# Patient Record
Sex: Female | Born: 1970 | Race: Black or African American | Hispanic: No | Marital: Single | Smoking: Current every day smoker
Health system: Southern US, Community
[De-identification: ages and names within clinical notes are randomized; demographics above are authoritative.]

## PROBLEM LIST (undated history)

## (undated) DIAGNOSIS — Z923 Personal history of irradiation: Secondary | ICD-10-CM

## (undated) DIAGNOSIS — F329 Major depressive disorder, single episode, unspecified: Secondary | ICD-10-CM

## (undated) DIAGNOSIS — G43909 Migraine, unspecified, not intractable, without status migrainosus: Secondary | ICD-10-CM

## (undated) DIAGNOSIS — F32A Depression, unspecified: Secondary | ICD-10-CM

## (undated) DIAGNOSIS — C801 Malignant (primary) neoplasm, unspecified: Secondary | ICD-10-CM

## (undated) HISTORY — PX: WISDOM TOOTH EXTRACTION: SHX21

## (undated) HISTORY — PX: PILONIDAL CYST EXCISION: SHX744

---

## 1998-10-05 ENCOUNTER — Emergency Department (HOSPITAL_COMMUNITY): Admission: EM | Admit: 1998-10-05 | Discharge: 1998-10-05 | Payer: Self-pay | Admitting: Emergency Medicine

## 1999-01-11 ENCOUNTER — Inpatient Hospital Stay: Admission: AD | Admit: 1999-01-11 | Discharge: 1999-01-11 | Payer: Self-pay

## 1999-05-29 ENCOUNTER — Inpatient Hospital Stay (HOSPITAL_COMMUNITY): Admission: AD | Admit: 1999-05-29 | Discharge: 1999-05-29 | Payer: Self-pay | Admitting: Obstetrics

## 1999-12-26 ENCOUNTER — Emergency Department (HOSPITAL_COMMUNITY): Admission: EM | Admit: 1999-12-26 | Discharge: 1999-12-26 | Payer: Self-pay | Admitting: Emergency Medicine

## 2000-11-23 ENCOUNTER — Encounter: Payer: Self-pay | Admitting: *Deleted

## 2000-11-23 ENCOUNTER — Inpatient Hospital Stay (HOSPITAL_COMMUNITY): Admission: AD | Admit: 2000-11-23 | Discharge: 2000-11-23 | Payer: Self-pay | Admitting: Obstetrics & Gynecology

## 2001-08-08 ENCOUNTER — Encounter (INDEPENDENT_AMBULATORY_CARE_PROVIDER_SITE_OTHER): Payer: Self-pay | Admitting: *Deleted

## 2001-08-08 ENCOUNTER — Ambulatory Visit (HOSPITAL_BASED_OUTPATIENT_CLINIC_OR_DEPARTMENT_OTHER): Admission: RE | Admit: 2001-08-08 | Discharge: 2001-08-08 | Payer: Self-pay | Admitting: *Deleted

## 2002-05-17 ENCOUNTER — Observation Stay (HOSPITAL_COMMUNITY): Admission: EM | Admit: 2002-05-17 | Discharge: 2002-05-18 | Payer: Self-pay

## 2002-10-28 ENCOUNTER — Inpatient Hospital Stay (HOSPITAL_COMMUNITY): Admission: AD | Admit: 2002-10-28 | Discharge: 2002-10-28 | Payer: Self-pay | Admitting: *Deleted

## 2005-04-13 ENCOUNTER — Inpatient Hospital Stay (HOSPITAL_COMMUNITY): Admission: AD | Admit: 2005-04-13 | Discharge: 2005-04-13 | Payer: Self-pay | Admitting: Obstetrics & Gynecology

## 2005-04-23 ENCOUNTER — Inpatient Hospital Stay (HOSPITAL_COMMUNITY): Admission: AD | Admit: 2005-04-23 | Discharge: 2005-04-23 | Payer: Self-pay | Admitting: Obstetrics and Gynecology

## 2005-07-01 ENCOUNTER — Inpatient Hospital Stay (HOSPITAL_COMMUNITY): Admission: AD | Admit: 2005-07-01 | Discharge: 2005-07-01 | Payer: Self-pay | Admitting: Family Medicine

## 2005-09-10 ENCOUNTER — Inpatient Hospital Stay (HOSPITAL_COMMUNITY): Admission: AD | Admit: 2005-09-10 | Discharge: 2005-09-10 | Payer: Self-pay | Admitting: Obstetrics and Gynecology

## 2006-04-04 ENCOUNTER — Emergency Department (HOSPITAL_COMMUNITY): Admission: EM | Admit: 2006-04-04 | Discharge: 2006-04-05 | Payer: Self-pay | Admitting: Emergency Medicine

## 2006-09-26 ENCOUNTER — Inpatient Hospital Stay (HOSPITAL_COMMUNITY): Admission: AD | Admit: 2006-09-26 | Discharge: 2006-09-26 | Payer: Self-pay | Admitting: Gynecology

## 2006-09-28 ENCOUNTER — Inpatient Hospital Stay (HOSPITAL_COMMUNITY): Admission: AD | Admit: 2006-09-28 | Discharge: 2006-09-28 | Payer: Self-pay | Admitting: Gynecology

## 2006-09-30 ENCOUNTER — Inpatient Hospital Stay (HOSPITAL_COMMUNITY): Admission: RE | Admit: 2006-09-30 | Discharge: 2006-09-30 | Payer: Self-pay | Admitting: Obstetrics and Gynecology

## 2006-10-07 ENCOUNTER — Inpatient Hospital Stay (HOSPITAL_COMMUNITY): Admission: AD | Admit: 2006-10-07 | Discharge: 2006-10-07 | Payer: Self-pay | Admitting: Family Medicine

## 2006-10-12 ENCOUNTER — Emergency Department (HOSPITAL_COMMUNITY): Admission: EM | Admit: 2006-10-12 | Discharge: 2006-10-12 | Payer: Self-pay | Admitting: Emergency Medicine

## 2006-10-13 ENCOUNTER — Emergency Department (HOSPITAL_COMMUNITY): Admission: EM | Admit: 2006-10-13 | Discharge: 2006-10-13 | Payer: Self-pay | Admitting: Emergency Medicine

## 2006-10-14 ENCOUNTER — Inpatient Hospital Stay (HOSPITAL_COMMUNITY): Admission: AD | Admit: 2006-10-14 | Discharge: 2006-10-14 | Payer: Self-pay | Admitting: Obstetrics & Gynecology

## 2007-05-01 ENCOUNTER — Encounter: Admission: RE | Admit: 2007-05-01 | Discharge: 2007-05-01 | Payer: Self-pay | Admitting: *Deleted

## 2007-06-05 ENCOUNTER — Ambulatory Visit: Payer: Self-pay | Admitting: Family Medicine

## 2007-06-05 ENCOUNTER — Ambulatory Visit: Payer: Self-pay | Admitting: *Deleted

## 2008-02-05 ENCOUNTER — Inpatient Hospital Stay (HOSPITAL_COMMUNITY): Admission: AD | Admit: 2008-02-05 | Discharge: 2008-02-05 | Payer: Self-pay | Admitting: Obstetrics

## 2008-03-15 ENCOUNTER — Inpatient Hospital Stay (HOSPITAL_COMMUNITY): Admission: AD | Admit: 2008-03-15 | Discharge: 2008-03-15 | Payer: Self-pay | Admitting: Obstetrics

## 2008-03-28 ENCOUNTER — Inpatient Hospital Stay (HOSPITAL_COMMUNITY): Admission: AD | Admit: 2008-03-28 | Discharge: 2008-03-30 | Payer: Self-pay | Admitting: Obstetrics

## 2009-07-14 ENCOUNTER — Emergency Department (HOSPITAL_COMMUNITY): Admission: EM | Admit: 2009-07-14 | Discharge: 2009-07-14 | Payer: Self-pay | Admitting: Emergency Medicine

## 2009-07-31 ENCOUNTER — Emergency Department (HOSPITAL_COMMUNITY): Admission: EM | Admit: 2009-07-31 | Discharge: 2009-08-01 | Payer: Self-pay | Admitting: Emergency Medicine

## 2009-10-25 ENCOUNTER — Ambulatory Visit: Payer: Self-pay | Admitting: Physician Assistant

## 2009-10-25 DIAGNOSIS — F3289 Other specified depressive episodes: Secondary | ICD-10-CM | POA: Insufficient documentation

## 2009-10-25 DIAGNOSIS — F329 Major depressive disorder, single episode, unspecified: Secondary | ICD-10-CM | POA: Insufficient documentation

## 2009-10-25 DIAGNOSIS — G43909 Migraine, unspecified, not intractable, without status migrainosus: Secondary | ICD-10-CM | POA: Insufficient documentation

## 2009-10-26 ENCOUNTER — Encounter: Payer: Self-pay | Admitting: Physician Assistant

## 2009-10-26 LAB — CONVERTED CEMR LAB
AST: 13 units/L (ref 0–37)
Albumin: 4.7 g/dL (ref 3.5–5.2)
Amphetamine Screen, Ur: NEGATIVE
Barbiturate Quant, Ur: NEGATIVE
Basophils Absolute: 0 10*3/uL (ref 0.0–0.1)
Cocaine Metabolites: NEGATIVE
Creatinine,U: 198.1 mg/dL
Eosinophils Absolute: 0 10*3/uL (ref 0.0–0.7)
Eosinophils Relative: 0 % (ref 0–5)
Glucose, Bld: 84 mg/dL (ref 70–99)
HCT: 39 % (ref 36.0–46.0)
Hemoglobin: 13.1 g/dL (ref 12.0–15.0)
Lymphs Abs: 3 10*3/uL (ref 0.7–4.0)
MCHC: 33.6 g/dL (ref 30.0–36.0)
MCV: 94 fL (ref 78.0–100.0)
Marijuana Metabolite: POSITIVE — AB
Neutro Abs: 5.6 10*3/uL (ref 1.7–7.7)
Neutrophils Relative %: 60 % (ref 43–77)
Phencyclidine (PCP): NEGATIVE
Propoxyphene: NEGATIVE
RBC: 4.15 M/uL (ref 3.87–5.11)
Sodium: 142 meq/L (ref 135–145)
TSH: 0.43 microintl units/mL (ref 0.350–4.500)
Total Protein: 7.2 g/dL (ref 6.0–8.3)

## 2009-10-27 ENCOUNTER — Ambulatory Visit (HOSPITAL_COMMUNITY): Admission: RE | Admit: 2009-10-27 | Discharge: 2009-10-27 | Payer: Self-pay | Admitting: Internal Medicine

## 2009-11-01 ENCOUNTER — Encounter (INDEPENDENT_AMBULATORY_CARE_PROVIDER_SITE_OTHER): Payer: Self-pay | Admitting: *Deleted

## 2010-02-26 ENCOUNTER — Inpatient Hospital Stay (HOSPITAL_COMMUNITY): Admission: AD | Admit: 2010-02-26 | Discharge: 2010-02-26 | Payer: Self-pay | Admitting: Obstetrics & Gynecology

## 2010-12-25 ENCOUNTER — Encounter: Payer: Self-pay | Admitting: Radiology

## 2011-01-05 NOTE — Letter (Signed)
Summary: TEST ORDER FORM//CT WITH CONTRAST//WITHOUT  TEST ORDER FORM//CT WITH CONTRAST//WITHOUT   Imported By: Arta Bruce 12/17/2009 11:46:06  _____________________________________________________________________  External Attachment:    Type:   Image     Comment:   External Document

## 2011-01-05 NOTE — Letter (Signed)
Summary: TEST ORDER FORM//CT WITH CONTRAST//APPT DATE & TIME  TEST ORDER FORM//CT WITH CONTRAST//APPT DATE & TIME   Imported By: Arta Bruce 12/20/2009 16:18:36  _____________________________________________________________________  External Attachment:    Type:   Image     Comment:   External Document

## 2011-01-05 NOTE — Letter (Signed)
Summary: PT INFORMATION SHEET  PT INFORMATION SHEET   Imported By: Arta Bruce 12/15/2009 15:13:22  _____________________________________________________________________  External Attachment:    Type:   Image     Comment:   External Document

## 2011-01-05 NOTE — Progress Notes (Signed)
Summary: Office Visit/DEPRESSION SCREENING  Office Visit/DEPRESSION SCREENING   Imported By: Arta Bruce 12/15/2009 15:59:57  _____________________________________________________________________  External Attachment:    Type:   Image     Comment:   External Document

## 2011-02-24 LAB — URINE MICROSCOPIC-ADD ON

## 2011-02-24 LAB — WET PREP, GENITAL: Clue Cells Wet Prep HPF POC: NONE SEEN

## 2011-02-24 LAB — URINALYSIS, ROUTINE W REFLEX MICROSCOPIC
Bilirubin Urine: NEGATIVE
Specific Gravity, Urine: 1.015 (ref 1.005–1.030)
Urobilinogen, UA: 0.2 mg/dL (ref 0.0–1.0)

## 2011-02-24 LAB — GC/CHLAMYDIA PROBE AMP, GENITAL: Chlamydia, DNA Probe: NEGATIVE

## 2011-04-18 NOTE — H&P (Signed)
NAMEDALISHA, SHIVELY           ACCOUNT NO.:  [id number]   MEDICAL RECORD NO.:  [id number]          PATIENT TYPE:  INP   LOCATION:  9162                          FACILITY:  [facility]   PHYSICIAN:  Roseanna Rainbow, M.D.DATE OF BIRTH:  [birth year]   DATE OF ADMISSION:  03/28/2008  DATE OF DISCHARGE:                              HISTORY & PHYSICAL   DATE OF DELIVERY:  March 28, 2008   CHIEF COMPLAINT:  The patient is a 40 year old para 1 with an estimated  date of confinement of Apr 10, 2008, with an intrauterine pregnancy at  38+ weeks complaining of rupture of membranes.   HISTORY OF PRESENT ILLNESS:  Please see the above.  The patient reports  rupture of membranes 6 hours prior to presentation, clear fluid.  Minimal uterine contractions.   PAST GYN HISTORY:  There is a history of Trichomonas.   PAST OBSTETRICAL HISTORY:  There is a history of five voluntary  terminations of pregnancies.  In 1991 she was delivered of a live-born  female, 7 pounds 14 ounces, full term, [facility].  There is history  of a first trimester spontaneous abortion.   PAST MEDICAL HISTORY:  She denies.   PAST SURGICAL HISTORY:  She denies.   FAMILY HISTORY:  Noncontributory.   SOCIAL HISTORY:  She is single.  She recently quit smoking.  She denies  any alcohol use.   OB RISK FACTORS:  Advanced maternal age.   PRENATAL LABORATORIES:  Hemoglobin 11.4; hematocrit 33.2; platelets  242,000.  Blood type O positive, antibody screen negative.  RPR  nonreactive.  Rubella immune.  Hepatitis B surface antigen negative.  HIV nonreactive.  PPD negative.  Gonorrhea and Chlamydia DNA probe  negative.  Quad screen negative.  One-hour GCT 75.  GBS negative on  April 7.   DELIVERY NOTE:  The vital signs were stable, afebrile.  Fetal heart  tracing with severe variable decelerations with pushing efforts.  The  patient was fully dilated and pushing.  A live-born female was delivered  over an intact perineum  direct OA compound presentation with the  posterior arm.  There was a loose body cord.  The oropharynx was  suctioned on the perineum.  The cord was clamped and cut.  The placenta  was delivered with cord traction intact with a three-vessel cord.  There  were no lacerations.  The estimated blood loss was less than 500 mL.  The mother and infant were in stable condition.      Roseanna Rainbow, M.D.  Electronically Signed     LAJ/MEDQ  D:  03/28/2008  T:  03/28/2008  Job:  [id number]   cc:   Kathreen Cosier, M.D.  Fax: [phone]

## 2011-04-21 NOTE — Op Note (Signed)
[place]. [facility]  Patient:    Nina Horn, Nina Horn Visit Number: [id number] MRN: [id number]          Service Type: SUR Location: [id number] Attending Physician:  Delsa Bern Dictated by:   Lorne Skeens. Hoxworth, M.D. Proc. Date: 05/17/02 Admit Date:  05/16/2002 Discharge Date: 05/18/2002                             Operative Report  PREOPERATIVE DIAGNOSIS:  Perirectal abscess.  POSTOPERATIVE DIAGNOSIS:  Perirectal abscess.  PROCEDURE:  Incision and drainage of perirectal abscess.  SURGEON:  Lorne Skeens. Hoxworth, M.D.  ANESTHESIA:  General.  INDICATIONS:  The patient is a 40 year old, African-American female who presents with a two- to three-day history of gradually worsening and now severe perianal pain.  In the emergency room, she was in severe pain and very difficult to exam, but there is severe tenderness on the right side of the anus with some fullness suggesting probable perirectal abscess.  Exam under anesthesia and I&D of perirectal abscess has been recommend.  The nature of procedure, indications and the risks of bleeding and infection were discussed and understood.  She is now brought to the operating room for this procedure.  DESCRIPTION OF PROCEDURE:  The patient was brought to the operating room and placed in supine position on the operating table.  General endotracheal anesthesia was induced.  She was carefully positioned in the lithotomy position.  The perineum was sterilely prepped and draped.  She will be given preoperative antibiotics.  Under anesthesia, I was able to palpate a several centimeter mass in the right posterior perianal space.  Approximately, a 1 cm incision was made down into this and a large amount of purulent material drained.  This was cultured.  Some loculations were broken up and the cavity completely drained.  This was packed with iodoform gauze.  The tissue surrounding this was infiltrated  with Marcaine with epinephrine.  Dry gauze dressing was applied and the patient was taken to the recovery room in good condition. Dictated by:   Lorne Skeens. Hoxworth, M.D. Attending Physician:  Delsa Bern DD:  05/17/02 TD:  05/19/02 Job: [id number]

## 2011-04-21 NOTE — Op Note (Signed)
[place]. [facility]  Patient:    Nina Horn, Nina Horn Visit Number: [id number] MRN: [id number]          Service Type: DSU Location: [facility] Attending Physician:  Maryanna Shape Dictated by:   Reynolds Bowl, M.D. Proc. 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  OPERATION/PROCEDURE: Excision of lesion.  SURGEON: Reynolds Bowl, M.D.  ANESTHESIA: General at the patients request.  DESCRIPTION OF PROCEDURE: The patient was given general anesthetic via endotracheal tube, placed in the prone position, and then prepped and draped in the usual manner for sterility.  I exsanguinated the foot and ankle area with an Esmarch bandage and then left it tight over a green towel.  The lesion in question was in about the area of the proximal phalanx of the left great toe, plantar surface.  There was an oblique, very dense, hard ridge of tissue with a couple of punctate areas in it, as if there could have been puncture wounds or tiny plantar warts.  Then underlying this was about a 2 x 2 slightly firm but tender cystic mass.  I outlined an oblique incision with proximal and distal extensions and then made the oblique incision and came down to a sac, a white smooth sac that when punctured released creamy white material.  This, then, I carefully excised by blunt dissection and went down to the flexor sheath, and this was removed in its entirety.  Then this very hard ridge of skin that contained what appeared to be old puncture wound or similar was excised so that I could then close the wound, approximating fairly normal skin to normal skin.  I let the tourniquet down, irrigated the area, and there was just a small amount of what would be expected venous  bleeding.  I approximated the skin using multiple 2-0 Vicryl sutures, applied Xeroform and then a bulky dressing. The patient is to elevate the foot and if needed to ambulate to ambulate on the heel, take Darvocet-N for pain, and be at house rest with the foot elevated.  I will see her tomorrow for dressing change.  I would plan to keep the sutures in place at least two weeks.  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 Attending Physician:  Maryanna Shape DD:  08/08/01 TD:  08/08/01 Job: [id number]

## 2011-08-28 LAB — URINALYSIS, ROUTINE W REFLEX MICROSCOPIC
Glucose, UA: NEGATIVE
Leukocytes, UA: NEGATIVE
Nitrite: NEGATIVE
Protein, ur: 100 — AB
Specific Gravity, Urine: >1.03 — ABNORMAL HIGH
Urobilinogen, UA: 0.2
pH: 6

## 2011-08-28 LAB — URINE MICROSCOPIC-ADD ON

## 2011-08-29 LAB — CBC
Hemoglobin: 10.9 — ABNORMAL LOW
MCHC: 35.3
MCHC: 35.4
MCV: 98.6
MCV: 99.2
Platelets: 226
Platelets: 270
RBC: 2.66 — ABNORMAL LOW
RBC: 3.13 — ABNORMAL LOW

## 2012-12-08 ENCOUNTER — Emergency Department (HOSPITAL_COMMUNITY)
Admission: EM | Admit: 2012-12-08 | Discharge: 2012-12-08 | Disposition: A | Discharge status: Home / Self Care | Payer: Self-pay | Attending: Emergency Medicine | Admitting: Emergency Medicine

## 2012-12-08 ENCOUNTER — Emergency Department (HOSPITAL_COMMUNITY): Payer: Self-pay

## 2012-12-08 ENCOUNTER — Encounter (HOSPITAL_COMMUNITY): Payer: Self-pay | Admitting: Adult Health

## 2012-12-08 DIAGNOSIS — F17200 Nicotine dependence, unspecified, uncomplicated: Secondary | ICD-10-CM | POA: Insufficient documentation

## 2012-12-08 DIAGNOSIS — Z8679 Personal history of other diseases of the circulatory system: Secondary | ICD-10-CM | POA: Insufficient documentation

## 2012-12-08 DIAGNOSIS — Z3202 Encounter for pregnancy test, result negative: Secondary | ICD-10-CM | POA: Insufficient documentation

## 2012-12-08 DIAGNOSIS — R51 Headache: Secondary | ICD-10-CM | POA: Insufficient documentation

## 2012-12-08 DIAGNOSIS — H53149 Visual discomfort, unspecified: Secondary | ICD-10-CM | POA: Insufficient documentation

## 2012-12-08 HISTORY — DX: Migraine, unspecified, not intractable, without status migrainosus: G43.909

## 2012-12-08 LAB — BASIC METABOLIC PANEL
BUN: 10 mg/dL (ref 6–23)
Calcium: 8.9 mg/dL (ref 8.4–10.5)
Creatinine, Ser: 0.73 mg/dL (ref 0.50–1.10)
GFR calc Af Amer: >90 mL/min (ref >90)
GFR calc non Af Amer: >90 mL/min (ref >90)
Glucose, Bld: 102 mg/dL — ABNORMAL HIGH (ref 70–99)
Potassium: 3.8 mEq/L (ref 3.5–5.1)

## 2012-12-08 LAB — CBC WITH DIFFERENTIAL/PLATELET
Basophils Relative: 0 % (ref 0–1)
Eosinophils Absolute: 0.1 10*3/uL (ref 0.0–0.7)
Eosinophils Relative: 1 % (ref 0–5)
Hemoglobin: 13.7 g/dL (ref 12.0–15.0)
Lymphs Abs: 3.4 10*3/uL (ref 0.7–4.0)
MCH: 31.6 pg (ref 26.0–34.0)
MCHC: 34.3 g/dL (ref 30.0–36.0)
MCV: 92.2 fL (ref 78.0–100.0)
Monocytes Relative: 9 % (ref 3–12)
Neutrophils Relative %: 39 % — ABNORMAL LOW (ref 43–77)

## 2012-12-08 LAB — URINALYSIS, ROUTINE W REFLEX MICROSCOPIC
Bilirubin Urine: NEGATIVE
Glucose, UA: NEGATIVE mg/dL
Leukocytes, UA: NEGATIVE
Protein, ur: NEGATIVE mg/dL
pH: 5.5 (ref 5.0–8.0)

## 2012-12-08 MED ORDER — METOCLOPRAMIDE HCL 10 MG PO TABS: 10.0000 mg | ORAL_TABLET | Freq: Four times a day (QID) | ORAL | Status: DC | PRN

## 2012-12-08 MED ORDER — METOCLOPRAMIDE HCL 5 MG/ML IJ SOLN
10.0000 mg | Freq: Once | INTRAMUSCULAR | Status: AC
  Administered 2012-12-08: 10 mg via INTRAVENOUS
  Filled 2012-12-08: qty 2

## 2012-12-08 MED ORDER — DIPHENHYDRAMINE HCL 50 MG/ML IJ SOLN
25.0000 mg | Freq: Once | INTRAMUSCULAR | Status: AC
  Administered 2012-12-08: 25 mg via INTRAVENOUS
  Filled 2012-12-08: qty 1

## 2012-12-08 MED ORDER — SODIUM CHLORIDE 0.9 % IV BOLUS (SEPSIS)
1000.0000 mL | Freq: Once | INTRAVENOUS | Status: AC
  Administered 2012-12-08: 1000 mL via INTRAVENOUS

## 2012-12-08 NOTE — ED Provider Notes (Signed)
History     CSN: [id number]  Arrival date & time 12/08/12  0136   First MD Initiated Contact with Patient 12/08/12 0149      Chief Complaint  Patient presents with  . Headache    (Consider location/radiation/quality/duration/timing/severity/associated sxs/prior treatment) The history is provided by the patient.  Nina Horn is a 42 y.o. female history of migraine here with worsening headache. For the last week she had frontal headaches the sense of pressure and pounding. It sometimes radiates down her neck. Also some mild photophobia. Denies any blurry vision or fever. Denies any rash. She said this is worse than her usual migraines. She took migraine relief and Goody body pain and alleve without improvement. Not sudden onset and no hx of aneurysms.    Past Medical History  Diagnosis Date  . Migraines     History reviewed. No pertinent past surgical history.  History reviewed. No pertinent family history.  History  Substance Use Topics  . Smoking status: Current Every Day Smoker -- 1.0 packs/day    Types: Cigarettes  . Smokeless tobacco: Not on file  . Alcohol Use: No    OB History    Grav Para Term Preterm Abortions TAB SAB Ect Mult Living                  Review of Systems  Neurological: Positive for headaches.  All other systems reviewed and are negative.    Allergies  Promethazine hcl  Home Medications   Current Outpatient Rx  Name  Route  Sig  Dispense  Refill  . ASPIRIN-ACETAMINOPHEN 500-325 MG PO PACK   Oral   Take 1 Package by mouth 2 (two) times daily as needed. For pain         . MIGRAINE RELIEF PO   Oral   Take 1 tablet by mouth every 8 (eight) hours as needed. For headache         . IBUPROFEN 200 MG PO TABS   Oral   Take 400 mg by mouth every 6 (six) hours as needed. For pain         . NAPROXEN SODIUM 220 MG PO TABS   Oral   Take 440 mg by mouth 2 (two) times daily as needed. For pain           BP 121/82  Pulse 75   Temp 98.2 F (36.8 C) (Oral)  Resp 16  SpO2 99%  LMP 12/08/2012  Physical Exam  Nursing note and vitals reviewed. Constitutional: She is oriented to person, place, and time.       Uncomfortable, holding her head   HENT:  Head: Normocephalic.  Mouth/Throat: Oropharynx is clear and moist.  Eyes: Conjunctivae normal are normal. Pupils are equal, round, and reactive to light.  Neck: Normal range of motion. Neck supple.  Cardiovascular: Normal rate, regular rhythm and normal heart sounds.   Pulmonary/Chest: Effort normal and breath sounds normal. No respiratory distress. She has no wheezes. She has no rales.  Abdominal: Soft. Bowel sounds are normal. She exhibits no distension. There is no tenderness. There is no rebound.  Musculoskeletal: Normal range of motion.  Neurological: She is alert and oriented to person, place, and time.       5/5 strength throughout, nl sensation.   Skin: Skin is warm and dry.  Psychiatric: She has a normal mood and affect. Her behavior is normal. Judgment and thought content normal.    ED Course  Procedures (including critical care  time)  Labs Reviewed  CBC WITH DIFFERENTIAL - Abnormal; Notable for the following:    Neutrophils Relative 39 (*)     Lymphocytes Relative 51 (*)     All other components within normal limits  BASIC METABOLIC PANEL - Abnormal; Notable for the following:    Glucose, Bld 102 (*)     All other components within normal limits  PREGNANCY, URINE  URINALYSIS, ROUTINE W REFLEX MICROSCOPIC   Ct Head Wo Contrast  12/08/2012  *RADIOLOGY REPORT*  Clinical Data: Headache for 1 week, pounding and pressure at forehead  CT HEAD WITHOUT CONTRAST  Technique:  Contiguous axial images were obtained from the base of the skull through the vertex without contrast.  Comparison: 10/27/2009  Findings: Normal ventricular morphology. No midline shift or mass effect. Normal appearance of brain parenchyma. No intracranial hemorrhage, mass lesion or  evidence of acute infarction. No extra-axial fluid collections. Question small osteoma at a left ethmoid air cell. Bones and sinuses otherwise unremarkable.  IMPRESSION: No acute intracranial abnormalities.   Original Report Authenticated By: Ulyses Southward, M.D.      No diagnosis found.    MDM  Nina Horn is a 42 y.o. female here with worsening headaches. No red flags for subarachnoid or meningitis. Given that this is worse than her usual headaches, CT head was ordered and was normal. Patient felt better after reglan and benadryl. I recommend that she follows up with a neurologist for her migraines.        Richardean Canal, MD 12/08/12 [phone]

## 2012-12-08 NOTE — ED Notes (Signed)
Presents with one week of headache that is described as pounding and pressure above forehead.; she states her head pounds and will not stop despite use of pain medications and ice packs. Pain radiates to neck. Pain is associated with sensitivity to light and sound. HX of migraines but this is not same pain. Alert, oriented, MAEx4, PERRLA.

## 2013-11-12 ENCOUNTER — Other Ambulatory Visit (HOSPITAL_COMMUNITY): Payer: Self-pay | Admitting: Obstetrics

## 2013-11-12 DIAGNOSIS — O09529 Supervision of elderly multigravida, unspecified trimester: Secondary | ICD-10-CM

## 2013-11-13 ENCOUNTER — Ambulatory Visit (HOSPITAL_COMMUNITY): Payer: Medicaid Other

## 2013-11-14 ENCOUNTER — Encounter (HOSPITAL_COMMUNITY): Payer: Self-pay

## 2013-11-14 ENCOUNTER — Ambulatory Visit (HOSPITAL_COMMUNITY)
Admission: RE | Admit: 2013-11-14 | Discharge: 2013-11-14 | Disposition: A | Discharge status: Home / Self Care | Payer: Medicaid Other | Source: Ambulatory Visit | Attending: Obstetrics | Admitting: Obstetrics

## 2013-11-14 ENCOUNTER — Other Ambulatory Visit (HOSPITAL_COMMUNITY): Payer: Self-pay | Admitting: Obstetrics

## 2013-11-14 ENCOUNTER — Ambulatory Visit (HOSPITAL_COMMUNITY): Admission: RE | Admit: 2013-11-14 | Payer: Medicaid Other | Source: Ambulatory Visit

## 2013-11-14 DIAGNOSIS — O09529 Supervision of elderly multigravida, unspecified trimester: Secondary | ICD-10-CM

## 2013-11-14 NOTE — Progress Notes (Signed)
Nina Horn was seen for ultrasound appointment today.  Please see [facility] report for details.

## 2013-11-18 ENCOUNTER — Inpatient Hospital Stay (HOSPITAL_COMMUNITY)
Admission: AD | Admit: 2013-11-18 | Discharge: 2013-11-18 | DRG: 779 | Disposition: A | Discharge status: Home / Self Care | Payer: Medicaid Other | Source: Ambulatory Visit | Attending: Obstetrics | Admitting: Obstetrics

## 2013-11-18 ENCOUNTER — Encounter (HOSPITAL_COMMUNITY): Payer: Self-pay | Admitting: *Deleted

## 2013-11-18 DIAGNOSIS — O021 Missed abortion: Principal | ICD-10-CM | POA: Diagnosis present

## 2013-11-18 MED ORDER — NALOXONE HCL 0.4 MG/ML IJ SOLN: 0.4000 mg | INTRAMUSCULAR | Status: DC | PRN

## 2013-11-18 MED ORDER — LACTATED RINGERS IV SOLN
INTRAVENOUS | Status: DC
  Administered 2013-11-18: 08:00:00 via INTRAVENOUS

## 2013-11-18 MED ORDER — INFLUENZA VAC SPLIT QUAD 0.5 ML IM SUSP
0.5000 mL | INTRAMUSCULAR | Status: DC
  Filled 2013-11-18: qty 0.5

## 2013-11-18 MED ORDER — INFLUENZA VAC SPLIT QUAD 0.5 ML IM SUSP
0.5000 mL | Freq: Once | INTRAMUSCULAR | Status: AC
  Administered 2013-11-18: 0.5 mL via INTRAMUSCULAR
  Filled 2013-11-18: qty 0.5

## 2013-11-18 MED ORDER — MISOPROSTOL 200 MCG PO TABS
600.0000 ug | ORAL_TABLET | Freq: Four times a day (QID) | ORAL | Status: DC
  Administered 2013-11-18: 600 ug via VAGINAL

## 2013-11-18 MED ORDER — DIPHENHYDRAMINE HCL 12.5 MG/5ML PO ELIX
12.5000 mg | ORAL_SOLUTION | Freq: Four times a day (QID) | ORAL | Status: DC | PRN
  Filled 2013-11-18: qty 5

## 2013-11-18 MED ORDER — ONDANSETRON HCL 4 MG/2ML IJ SOLN: 4.0000 mg | Freq: Four times a day (QID) | INTRAMUSCULAR | Status: DC | PRN

## 2013-11-18 MED ORDER — DIPHENHYDRAMINE HCL 50 MG/ML IJ SOLN: 12.5000 mg | Freq: Four times a day (QID) | INTRAMUSCULAR | Status: DC | PRN

## 2013-11-18 MED ORDER — HYDROMORPHONE 0.3 MG/ML IV SOLN: INTRAVENOUS | Status: DC

## 2013-11-18 MED ORDER — MISOPROSTOL 200 MCG PO TABS
ORAL_TABLET | ORAL | Status: AC
  Filled 2013-11-18: qty 3

## 2013-11-18 MED ORDER — SODIUM CHLORIDE 0.9 % IJ SOLN: 9.0000 mL | INTRAMUSCULAR | Status: DC | PRN

## 2013-11-18 MED ORDER — MISOPROSTOL 200 MCG PO TABS
600.0000 ug | ORAL_TABLET | Freq: Once | ORAL | Status: AC
  Administered 2013-11-18: 600 ug via VAGINAL
  Filled 2013-11-18: qty 3

## 2013-11-18 NOTE — Progress Notes (Signed)
Patient ID: Nina Horn, female   DOB: 1971/08/29, 42 y.o.   MRN: [id number] Past fetus and placenta intact no cramping no abnormal bleeding she'll be discharged this p.m.

## 2013-11-18 NOTE — H&P (Signed)
This is Dr. Francoise Ceo dictating the history and physical on  Nina Horn she's a 42 year old gravida 8 para 2 who 143 at [redacted] weeks gestation she was seen 2 weeks a goal and had a fetal heart an ultrasound on Friday at [facility] showed a 14 week size fetal demise she is in for termination of pregnancy using Cytotec she had 2 medium-size laminae are placed in her cervix in my office yesterday and these were removed this a.m. And 600 of Cytotec inserted intravaginally Past medical history negative Past surgical history negative Social history father the baby is 33 years old System review negative Physical exam well-developed female in no distress HEENT negative Lungs clear to P&A Breasts negative Abdomen negative pelvic uterus 14 weeks size Extremities negative and

## 2013-11-18 NOTE — Progress Notes (Signed)
11/18/13 1500  Clinical Encounter Type  Visited With Patient  Visit Type Follow-up;Spiritual support;Social support  Spiritual Encounters  Spiritual Needs Grief support;Emotional;Prayer   Followed up, providing extensive pastoral reflection as Nina Horn used her faith to help her cope with loss and to move toward healing.  Spending time with her baby was very helpful and meaningful for her, as was having chaplain presence alongside RN and NT.  Her mom Nina Horn came to visit just as we were concluding our time together.  Plan to follow up in the morning if Nina Horn is still here.  [address] [place], [place] [phone]

## 2013-11-18 NOTE — Progress Notes (Signed)
11/18/13 1300  Clinical Encounter Type  Visited With Patient;Health care provider (RN, NT)  Visit Type Spiritual support;Social support (support immediately after delivery)  Referral From Nurse  Spiritual Encounters  Spiritual Needs Emotional;Grief support;Prayer  Stress Factors  Patient Stress Factors Loss (IUFD @14w )   Provided pastoral presence and support as Jailyn struggled with immediate postpartum contractions and held her hand as she labored to pass placenta.  Offered prayer for mom and baby boy Tevin (spelling?), provided opportunity for Annai to share and process her story/grief/hopes, and admired baby with mom.  She was grateful for chaplain support.  Will follow up for further care this afternoon.  [address] [place], [place] [phone]

## 2013-11-18 NOTE — Discharge Summary (Signed)
  Patient is [redacted] weeks pregnant by ultrasound on Friday and had a fetal demise noted to laminaria were inserted in her cervix yesterday and they're more this morning 600 mg of Cytotec inserted in the vagina and she had a spontaneous vaginal delivery of a dead fetus and the placenta was also spontaneous she'll be discharged home tonight

## 2013-11-19 NOTE — Progress Notes (Signed)
Post discharge chart review completed.  

## 2014-06-15 LAB — OB RESULTS CONSOLE HIV ANTIBODY (ROUTINE TESTING): HIV: NONREACTIVE

## 2014-06-15 LAB — OB RESULTS CONSOLE RUBELLA ANTIBODY, IGM: Rubella: IMMUNE

## 2014-06-15 LAB — OB RESULTS CONSOLE ABO/RH: RH Type: POSITIVE

## 2014-06-15 LAB — OB RESULTS CONSOLE GC/CHLAMYDIA
CHLAMYDIA, DNA PROBE: NEGATIVE
Gonorrhea: NEGATIVE

## 2014-06-15 LAB — OB RESULTS CONSOLE ANTIBODY SCREEN: Antibody Screen: NEGATIVE

## 2014-06-15 LAB — OB RESULTS CONSOLE RPR: RPR: NONREACTIVE

## 2014-06-16 ENCOUNTER — Other Ambulatory Visit (HOSPITAL_COMMUNITY): Payer: Self-pay | Admitting: Obstetrics

## 2014-06-16 DIAGNOSIS — Z3689 Encounter for other specified antenatal screening: Secondary | ICD-10-CM

## 2014-06-25 ENCOUNTER — Inpatient Hospital Stay (HOSPITAL_COMMUNITY): Admission: AD | Admit: 2014-06-25 | Payer: Medicaid Other | Source: Ambulatory Visit | Admitting: Obstetrics

## 2014-06-26 ENCOUNTER — Encounter (HOSPITAL_COMMUNITY): Payer: Self-pay

## 2014-06-26 ENCOUNTER — Ambulatory Visit (HOSPITAL_COMMUNITY)
Admission: RE | Admit: 2014-06-26 | Discharge: 2014-06-26 | Disposition: A | Discharge status: Home / Self Care | Payer: Medicaid Other | Source: Ambulatory Visit | Attending: Obstetrics | Admitting: Obstetrics

## 2014-06-26 ENCOUNTER — Other Ambulatory Visit (HOSPITAL_COMMUNITY): Payer: Self-pay | Admitting: Obstetrics and Gynecology

## 2014-06-26 DIAGNOSIS — IMO0002 Reserved for concepts with insufficient information to code with codable children: Secondary | ICD-10-CM | POA: Insufficient documentation

## 2014-06-26 DIAGNOSIS — O09529 Supervision of elderly multigravida, unspecified trimester: Secondary | ICD-10-CM

## 2014-06-26 DIAGNOSIS — Z3689 Encounter for other specified antenatal screening: Secondary | ICD-10-CM

## 2014-06-26 DIAGNOSIS — Z363 Encounter for antenatal screening for malformations: Secondary | ICD-10-CM | POA: Insufficient documentation

## 2014-06-26 DIAGNOSIS — O2620 Pregnancy care for patient with recurrent pregnancy loss, unspecified trimester: Secondary | ICD-10-CM | POA: Diagnosis not present

## 2014-06-26 DIAGNOSIS — O09291 Supervision of pregnancy with other poor reproductive or obstetric history, first trimester: Secondary | ICD-10-CM

## 2014-06-26 DIAGNOSIS — Z1389 Encounter for screening for other disorder: Secondary | ICD-10-CM | POA: Diagnosis not present

## 2014-06-26 DIAGNOSIS — O09521 Supervision of elderly multigravida, first trimester: Secondary | ICD-10-CM

## 2014-06-26 DIAGNOSIS — O09292 Supervision of pregnancy with other poor reproductive or obstetric history, second trimester: Secondary | ICD-10-CM

## 2014-06-26 DIAGNOSIS — O09299 Supervision of pregnancy with other poor reproductive or obstetric history, unspecified trimester: Secondary | ICD-10-CM | POA: Insufficient documentation

## 2014-06-26 NOTE — Progress Notes (Signed)
Genetic Counseling  High-Risk Gestation Note  Appointment Date:  06/26/2014 Referred By: Frederico Hamman, MD Date of Birth:  [birth year] Partner:  Shelbie Proctor   Pregnancy History: [id number] Estimated Date of Delivery: 01/05/15 Estimated Gestational Age: 4w3dAttending: JViann Fish MD   Ms. MNino Glowand her partner, Mr. TShelbie Proctor were seen for genetic counseling because of a maternal age of 43y.o..  She was also seen for MFM consultation today given her history of pregnancy loss. See separate MFM consult note for detailed discussion.    They were counseled regarding maternal age and the association with risk for chromosome conditions due to nondisjunction with aging of the ova.   We reviewed chromosomes, nondisjunction, and the associated 1 in 299risk for fetal aneuploidy related to a maternal age of 43y.o. at 141w3destation.  They were counseled that the risk for aneuploidy decreases as gestational age increases, accounting for those pregnancies which spontaneously abort.  We specifically discussed Down syndrome (trisomy 2153 trisomies 136nd 1827and sex chromosome aneuploidies (47,XXX and 47,XXY) including the common features and prognoses of each.   We reviewed available screening options including First Screen, Quad screen, noninvasive prenatal screening (NIPS)/cell free DNA (cfDNA) testing, and detailed ultrasound. They were counseled that screening tests are used to modify a patient's a priori risk for aneuploidy, typically based on age. This estimate provides a pregnancy specific risk assessment. We reviewed the benefits and limitations of each option. Specifically, we discussed the conditions for which each test screens, the detection rates, and false positive rates of each. They were also counseled regarding diagnostic testing via CVS and amniocentesis. We reviewed the approximate 1 in 30947-096isk for complications for amniocentesis, including spontaneous pregnancy  loss. After consideration of all the options, they elected to proceed with ultrasound screening only at this time. She declined maternal serum analyte screening (first trimester and Quad screen) and NIPS at this time. She stated that she would possibly consider further screening pending ultrasound results. She declined diagnostic, invasive testing in the pregnancy.    She expressed interest in pursuing a nuchal translucency ultrasound, which was performed today.  The report will be documented separately.  The patient would like to return for a detailed ultrasound at ~18+ weeks gestation.  This appointment was scheduled for 08/07/14. She understands that screening tests cannot rule out all birth defects or genetic syndromes. The patient was advised of this limitation and states she still does not want additional testing at this time.   Ms. AuCandyas provided with written information regarding sickle cell anemia (SCA) including the carrier frequency and incidence in the African-American population, the availability of carrier testing and prenatal diagnosis if indicated.  In addition, we discussed that hemoglobinopathies are routinely screened for as part of the  newborn screening panel.  She reported that she had sickle cell screening in the past, which was negative.  Additionally, Ms. AuHunneported that she recently learned that her maternal grandfather was Jewish. We discussed the availability of carrier screening for certain genetic conditions based on Jewish ancestry.  Genetic testing is available for some of the more common conditions, including Canavan disease, cystic fibrosis, Tay sachs disease, Gaucher disease, Mucolipidosis type IV,  Neimann-Pick type A, Fanconi anemia type C, Bloom syndrome, and familial dysautonomia.  All of these conditions are inherited in an autosomal recessive fashion, where both parents have to be carriers of the condition before a baby is at increased risk (1 in 4, or  25% risk) to inherit the disease. Carrier screening performed through a standard blood draw can reduce but not eliminate a person's chance to be a carrier for these conditions. The father of the pregnancy is [place] American and reported no known Jewish ancestry. We discussed that his chance to be a carrier for one of these conditions would be significantly lower than an individual with Jewish ancestry. Mrs. Krishawna A Whiteaker declined Ashkenazi Jewish ancestry based carrier screening at the time of today's visit.   Both family histories were reviewed and found to be contributory for a previous daughter for the patient (with a previous partner) with Thanatophoric dysplasia. She reported that this was suspected prenatally, and her daughter passed away at birth. Thanatophoric dysplasia is a type of short-limb dwarfism that is typically lethal in the neonatal period. It is estimated to occur in approximately 1 in 20,000 to 1 in 50,000 births. Thanatophoric dysplasia has been found to occur due to a nonworking gene change (mutation) in the FGFR3 gene, with most individuals representing a de novo (new in the individual and not inherited) autosomal dominant mutation. Given this observation, recurrence risk estimate for an individual who has had one affected child is estimated to be low but may be slightly increased above the general population risk, given the possibility of germline mosaicism. Detailed ultrasound is available in the pregnancy to assess for possible features of skeletal dysplasias. Ms. Kalil understands that ultrasound cannot diagnose or rule out all birth defects or genetic conditions.   The father of the pregnancy reported that his mother and his maternal half-aunt (his mother's maternal half-sister) are learning disabled. They are not able to live independently and reside with their mother. The reportedly do not have physical health problems or physical differences from relatives. The  underlying etiology is not known for their intellectual disability. They were counseled that there are many different causes of intellectual disabilities including environmental, multifactorial, and genetic etiologies.  We discussed that a specific diagnosis for intellectual disability can be determined in approximately 50% of these individuals.  In the remaining 50% of individuals, a diagnosis may never be determined.  Regarding genetic causes, we discussed that chromosome aberrations (aneuploidy, deletions, duplications, insertions, and translocations) are responsible for a small percentage of individuals with intellectual disability.  Many individuals with chromosome aberrations have additional differences, including congenital anomalies or minor dysmorphisms.  Likewise, single gene conditions are the underlying cause of intellectual delay in some families.  We discussed that many gene conditions have intellectual disability as a feature, but also often include other physical or medical differences.  Specifically, we reviewed fragile X syndrome and the X-linked inheritance of this condition. We discussed the option of this family having an evaluation by a medical geneticist to help determine the cause of the familial intellectual disability.  We discussed that without more specific information, it is difficult to provide an accurate risk assessment.  Further genetic counseling is warranted if more information is obtained.  Ms. MUSKAN BOLLA denied exposure to environmental toxins or chemical agents. She denied the use of alcohol, tobacco or street drugs. She denied significant viral illnesses during the course of her pregnancy. Her medical and surgical histories were contributory for three previous TABs and two previous first trimester miscarriages, one of which was with the current partner. Ms. Brian met with Dr. Margurite Auerbach for MFM consult today given the history of pregnancy loss. Please see separate  MFM consult note for detailed discussion.    I counseled this  couple regarding the above risks and available options.  The approximate face-to-face time with the genetic counselor was 50 minutes.  Chipper Oman, MS,  Certified [employer] 06/26/2014

## 2014-06-26 NOTE — Consult Note (Addendum)
MFM Consultation  After taking the patient's history and reviewing available information and medical records, I discussed the subject of recurrent miscarriage in detail. I explained that there are several definitions of recurrent miscarriage, and that there is some argument as to whether two or three early pregnancy losses are required to make the diagnosis. She has had two losses: 14wks and a second very early before any documentation by ultrasound of an embryo.  The patient and I went over the known and suspected etiologies of recurrent miscarriage, including genetic, autoimmune, anatomic, and thrombophilic.  In the genetic category, I explained that 2-4 percent of couples with recurrent miscarriage are found to carry a balanced translocation. I explained the nature of a balanced translocation, emphasizing that people who carry balanced translocations are completely normal, but because of their chromosomal rearrangements, they are at increased risk for miscarriage of genetically abnormal offspring (see genetic counselor note).  In regard to autoimmune conditions resulting in miscarriage, the most well described is antiphospholipid syndrome. We discussed the nature of autoimmune conditions in general and antiphospholipid syndrome per se. I described the diagnostic criteria for antiphospholipid syndrome, including antibody markers and clinical criteria. Finally, I explained that the literature indicates that some 5-15 percent of women with recurrent miscarriage are found to have antiphospholipid antibodies.  We spoke about anatomic (structural) malformations of the uterus as a cause for pregnancy loss. I described a septate uterus and told the patient that the diagnosis of uterine malformation usually involves  hysterosalpingogram, hysteroscopy, or sonohystogram. We also briefly discussed uterine synecheae as a possible cause of pregnancy loss. I indicated that structural uterine abnormalities are found  in about 10 percent of recurrent pregnancy loss. I told her that some malformations, such as a uterine septum, are correctable, generally with hysteroscopic surgery. I talked to the patient about possible thrombophilic causes for recurrent pregnancy loss, explaining that there is considerable controversy about whether or not they contribute to recurrent early miscarriage (pre-embryonic and embryonic losses). I mentioned, however, that thrombophilias were better accepted as a "cause" of fetal death. I also explained our developing understanding of the role of thrombophilic mutations in recurrent pregnancy loss (and third-trimester complications), including the presumed underlying pathophysiology.  Recommendations: 1. Antiphospholipid antibody workup (drawn today).  If negative then manage as pregnancy with extreme AMA (>41yo) 2. Anatomic survey in 6 weeks 3. [facility] 4. Interval growth q 6 weeks beginning at 24 weeks. 5. Patient declined genetic screening (see genetic counseling note).  Time Spent: I spent in excess of 30 minutes in consultation with this patient to review records, evaluate her case, and provide her with an adequate discussion and education. More than 50% of this time was spent in direct face-to-face counseling. It was a pleasure seeing your patient in the office today. Thank you for consultation. Please do not hesitate to contact our service for any further questions.  Thank you,  Louann SjogrenJeffrey Morgan Woodlawn HospitalDenney

## 2014-06-29 LAB — BETA-2-GLYCOPROTEIN I ABS, IGG/M/A
BETA-2-GLYCOPROTEIN I IGM: 2 M Units (ref <20)
Beta-2 Glyco I IgG: 14 G Units (ref <20)
Beta-2-Glycoprotein I IgA: 5 A Units (ref <20)

## 2014-06-29 LAB — LUPUS ANTICOAGULANT PANEL
DRVVT: 39.7 secs (ref <42.9)
LUPUS ANTICOAGULANT: NOT DETECTED
PTT LA: 40.8 s (ref 28.0–43.0)

## 2014-06-29 LAB — CARDIOLIPIN ANTIBODIES, IGG, IGM, IGA
Anticardiolipin IgA: 3 APL U/mL — ABNORMAL LOW (ref <22)
Anticardiolipin IgG: 8 GPL U/mL — ABNORMAL LOW (ref <23)
Anticardiolipin IgM: 0 MPL U/mL — ABNORMAL LOW (ref <11)

## 2014-07-15 ENCOUNTER — Telehealth (HOSPITAL_COMMUNITY): Payer: Self-pay | Admitting: *Deleted

## 2014-07-15 NOTE — Telephone Encounter (Signed)
Called patient at phone number listed. Left message that Anticardiolipin, lupus anticoagulant and Beta-2 Glycoprotein labs are WNL.  Instructed patient to call back with any questions or concerns.

## 2014-08-07 ENCOUNTER — Encounter (HOSPITAL_COMMUNITY): Payer: Self-pay

## 2014-08-07 ENCOUNTER — Ambulatory Visit (HOSPITAL_COMMUNITY)
Admission: RE | Admit: 2014-08-07 | Discharge: 2014-08-07 | Disposition: A | Discharge status: Home / Self Care | Payer: Medicaid Other | Source: Ambulatory Visit | Attending: Obstetrics and Gynecology | Admitting: Obstetrics and Gynecology

## 2014-08-07 VITALS — BP 109/53 | HR 78 | Wt 135.0 lb

## 2014-08-07 DIAGNOSIS — O09299 Supervision of pregnancy with other poor reproductive or obstetric history, unspecified trimester: Secondary | ICD-10-CM | POA: Diagnosis not present

## 2014-08-07 DIAGNOSIS — O09529 Supervision of elderly multigravida, unspecified trimester: Secondary | ICD-10-CM | POA: Diagnosis not present

## 2014-08-07 DIAGNOSIS — Z3689 Encounter for other specified antenatal screening: Secondary | ICD-10-CM | POA: Insufficient documentation

## 2014-08-07 DIAGNOSIS — O09292 Supervision of pregnancy with other poor reproductive or obstetric history, second trimester: Secondary | ICD-10-CM

## 2014-10-05 ENCOUNTER — Encounter (HOSPITAL_COMMUNITY): Payer: Self-pay

## 2014-10-23 ENCOUNTER — Other Ambulatory Visit (HOSPITAL_COMMUNITY): Payer: Self-pay | Admitting: Obstetrics & Gynecology

## 2014-10-23 DIAGNOSIS — O09523 Supervision of elderly multigravida, third trimester: Secondary | ICD-10-CM

## 2014-10-27 ENCOUNTER — Ambulatory Visit (HOSPITAL_COMMUNITY): Payer: Medicaid Other | Attending: Obstetrics

## 2014-11-03 ENCOUNTER — Other Ambulatory Visit (HOSPITAL_COMMUNITY): Payer: Self-pay | Admitting: Obstetrics

## 2014-11-03 DIAGNOSIS — Z3A32 32 weeks gestation of pregnancy: Secondary | ICD-10-CM

## 2014-11-03 DIAGNOSIS — O09522 Supervision of elderly multigravida, second trimester: Secondary | ICD-10-CM

## 2014-11-04 ENCOUNTER — Other Ambulatory Visit (HOSPITAL_COMMUNITY): Payer: Self-pay | Admitting: Obstetrics

## 2014-11-04 ENCOUNTER — Ambulatory Visit (HOSPITAL_COMMUNITY)
Admission: RE | Admit: 2014-11-04 | Discharge: 2014-11-04 | Disposition: A | Discharge status: Home / Self Care | Payer: Medicaid Other | Source: Ambulatory Visit | Attending: Obstetrics | Admitting: Obstetrics

## 2014-11-04 ENCOUNTER — Encounter (HOSPITAL_COMMUNITY): Payer: Self-pay

## 2014-11-04 VITALS — BP 127/59 | HR 104 | Wt 149.5 lb

## 2014-11-04 DIAGNOSIS — Z3A31 31 weeks gestation of pregnancy: Secondary | ICD-10-CM | POA: Diagnosis not present

## 2014-11-04 DIAGNOSIS — O09523 Supervision of elderly multigravida, third trimester: Secondary | ICD-10-CM | POA: Diagnosis present

## 2014-11-04 DIAGNOSIS — O09522 Supervision of elderly multigravida, second trimester: Secondary | ICD-10-CM

## 2014-11-04 DIAGNOSIS — Z3A32 32 weeks gestation of pregnancy: Secondary | ICD-10-CM

## 2014-11-04 DIAGNOSIS — Z36 Encounter for antenatal screening of mother: Secondary | ICD-10-CM | POA: Insufficient documentation

## 2014-11-05 DIAGNOSIS — O09529 Supervision of elderly multigravida, unspecified trimester: Secondary | ICD-10-CM | POA: Insufficient documentation

## 2014-11-05 DIAGNOSIS — Z3A31 31 weeks gestation of pregnancy: Secondary | ICD-10-CM | POA: Insufficient documentation

## 2014-11-05 DIAGNOSIS — Z3689 Encounter for other specified antenatal screening: Secondary | ICD-10-CM | POA: Insufficient documentation

## 2014-12-04 NOTE — L&D Delivery Note (Signed)
Delivery Note At 11:05 PM a viable female was delivered via NSVD (Presentation: LOA;  ).  APGAR: 9-9 , ; weight:  3115 grams  .   Placenta status: intact , .  Cord: 3 vessel,  with the following complications: none .  Cord pH: none  Anesthesia:  none Episiotomy:  none Lacerations:  none Suture Repair: none Est. Blood Loss (mL): 350   Mom to postpartum.  Baby to Couplet care / Skin to Skin.  HARPER,CHARLES A 01/01/2015, 11:38 PM

## 2014-12-07 IMAGING — CT CT HEAD W/O CM
1 of 2 series · 13 of 30 positions shown, 17 images · non-contrast
Comparison: 10/27/2009

CLINICAL DATA: Headache for 1 week, pounding and pressure at
forehead

CT HEAD WITHOUT CONTRAST
TECHNIQUE: Contiguous axial images were obtained from the base of
the skull through the vertex without contrast.

[Series 2: brain · axial · 0.47mm/px · z∈[+116,+236]mm · 13 of 28 slices shown, 17 images]
[im 2/28  brain]
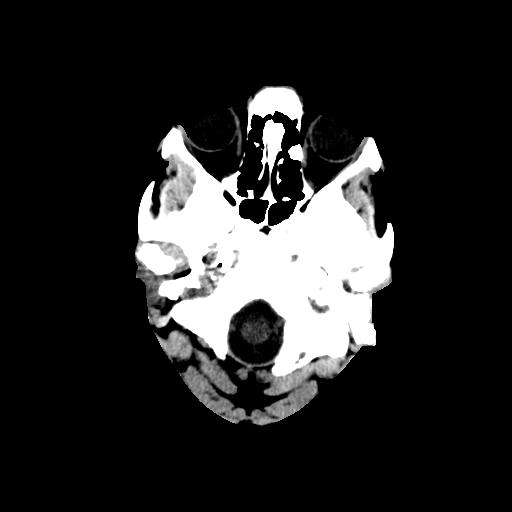
[im 2/28  bone]
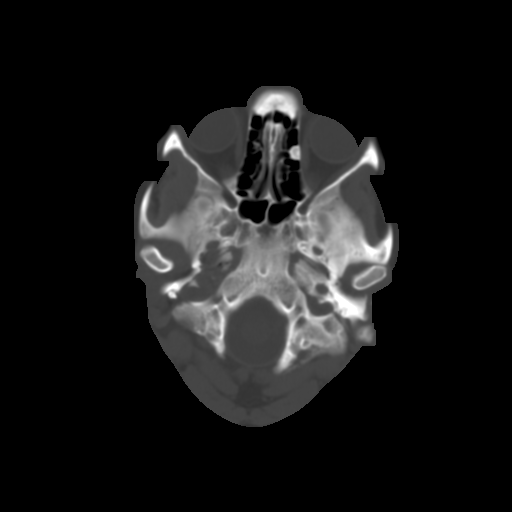
[im 4/28  brain]
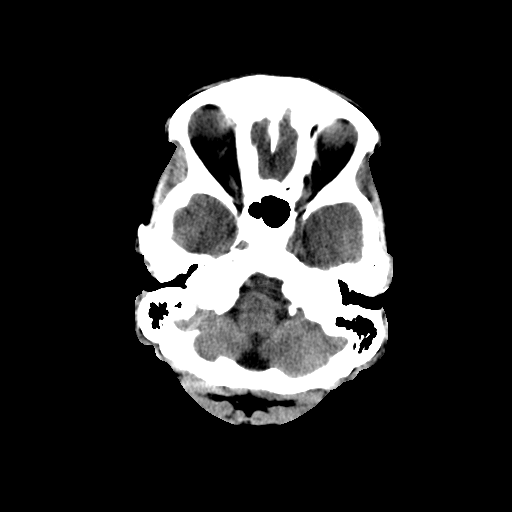
[im 6/28  brain]
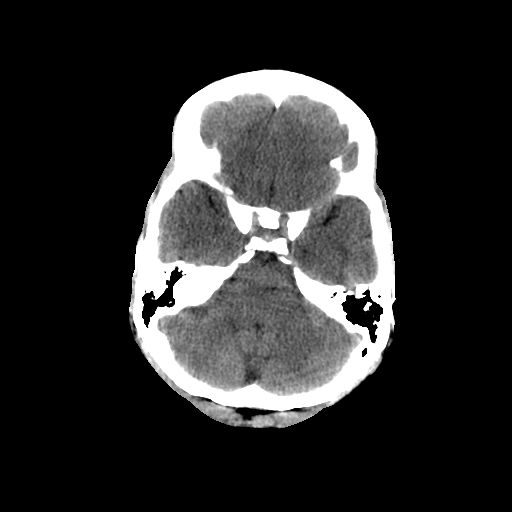
[im 8/28  brain]
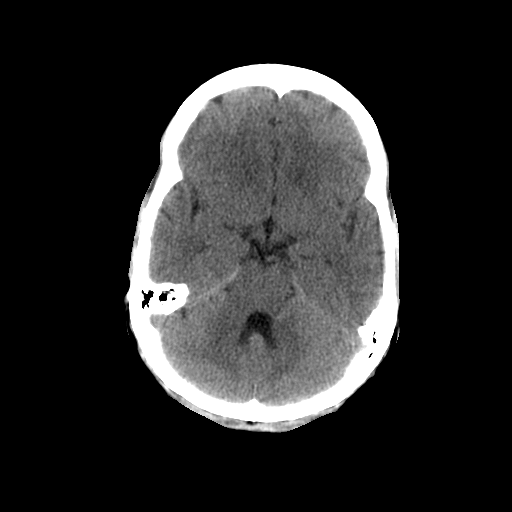
[im 10/28  brain]
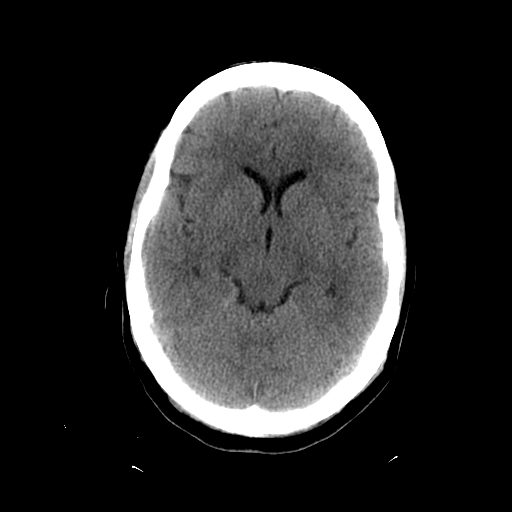
[im 10/28  bone]
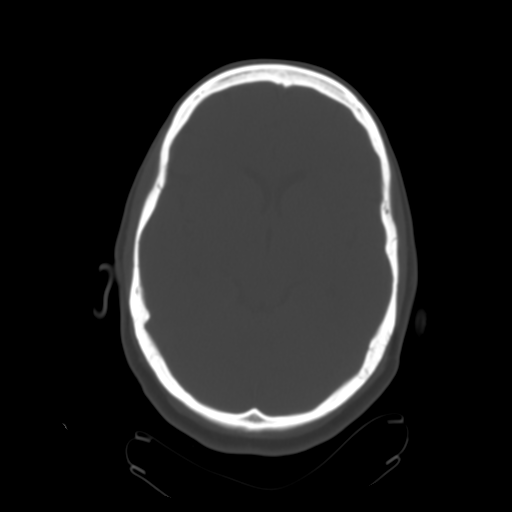
[im 12/28  brain]
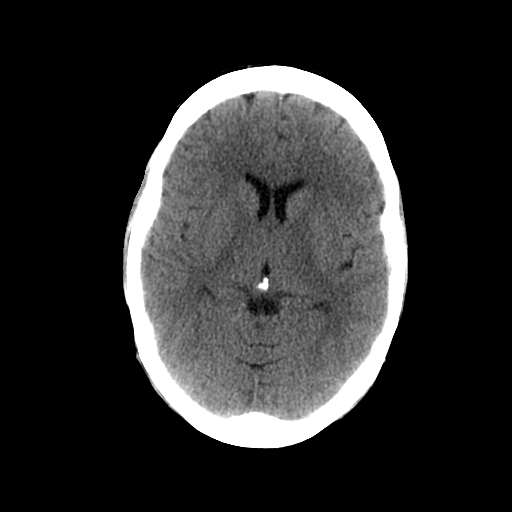
[im 14/28  brain]
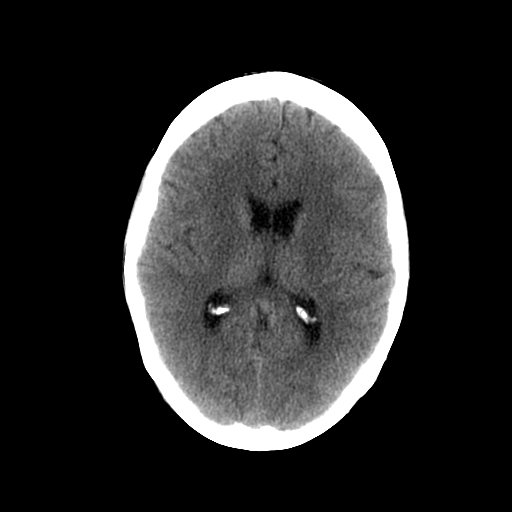
[im 16/28  brain]
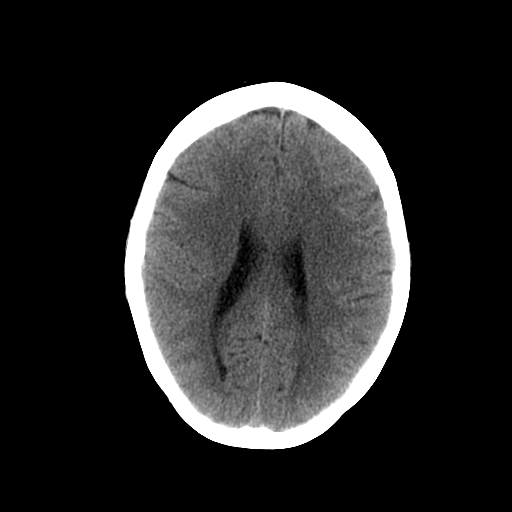
[im 18/28  brain]
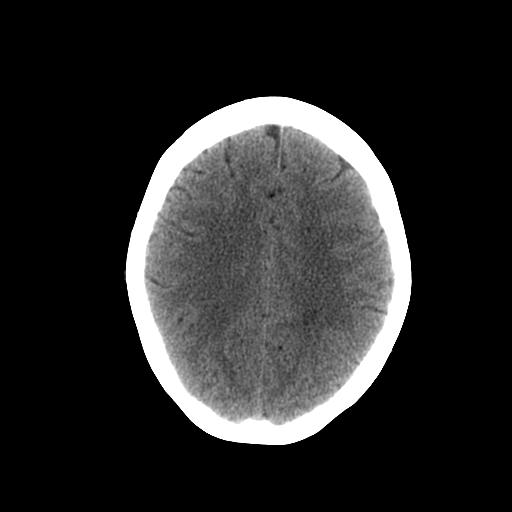
[im 18/28  bone]
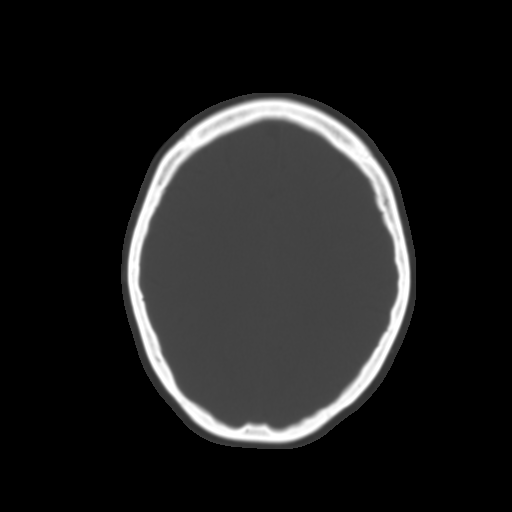
[im 20/28  brain]
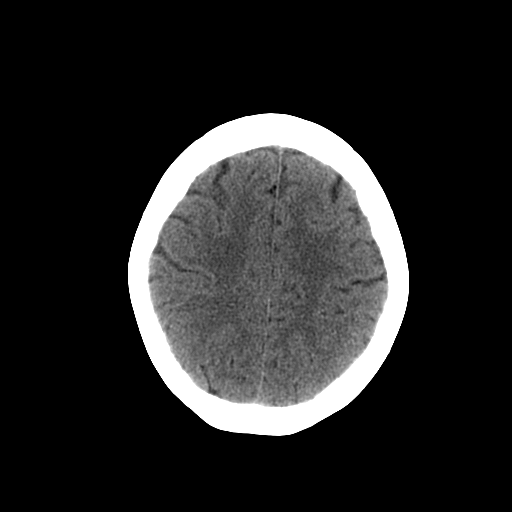
[im 22/28  brain]
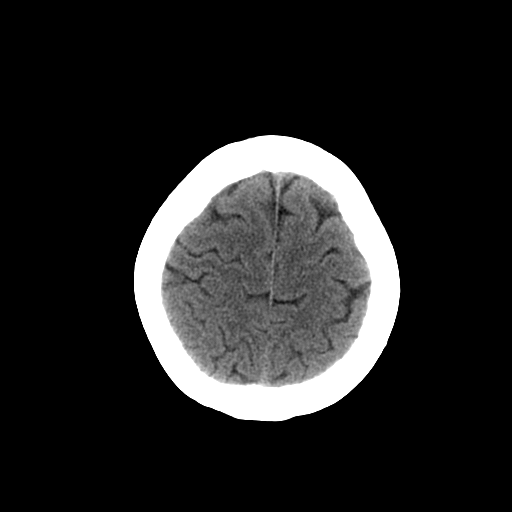
[im 24/28  brain]
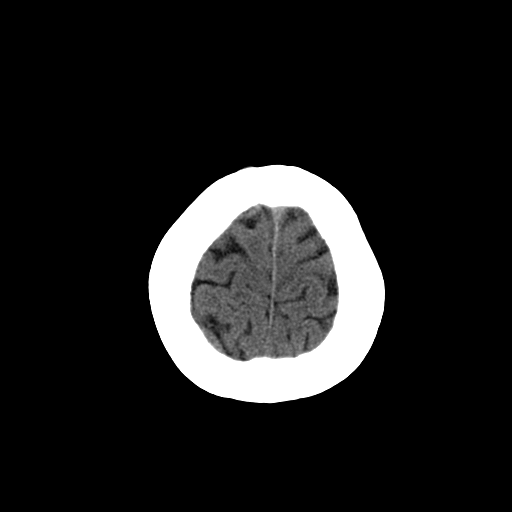
[im 26/28  brain]
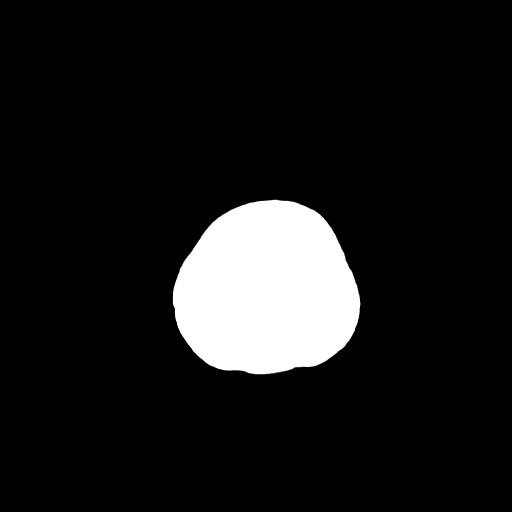
[im 26/28  bone]
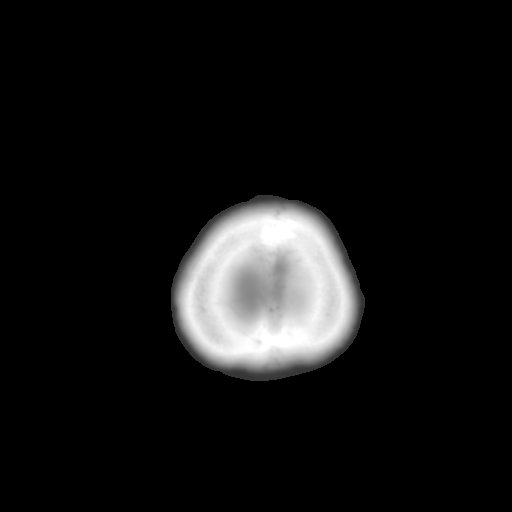

[13 of 30 positions shown; findings below may reference images not displayed]

FINDINGS: Normal ventricular morphology.
No midline shift or mass effect.
Normal appearance of brain parenchyma.
No intracranial hemorrhage, mass lesion or evidence of acute
infarction.
No extra-axial fluid collections.
Question small osteoma at a left ethmoid air cell.
Bones and sinuses otherwise unremarkable.
IMPRESSION: No acute intracranial abnormalities.

## 2014-12-09 ENCOUNTER — Ambulatory Visit (HOSPITAL_COMMUNITY): Admission: RE | Admit: 2014-12-09 | Payer: Medicaid Other | Source: Ambulatory Visit

## 2014-12-14 LAB — OB RESULTS CONSOLE GBS: GBS: POSITIVE

## 2015-01-01 ENCOUNTER — Encounter (HOSPITAL_COMMUNITY): Payer: Self-pay | Admitting: Anesthesiology

## 2015-01-01 ENCOUNTER — Encounter (HOSPITAL_COMMUNITY): Payer: Self-pay | Admitting: *Deleted

## 2015-01-01 ENCOUNTER — Inpatient Hospital Stay (HOSPITAL_COMMUNITY)
Admission: AD | Admit: 2015-01-01 | Discharge: 2015-01-03 | DRG: 775 | Disposition: A | Discharge status: Home / Self Care | Payer: Medicaid Other | Source: Ambulatory Visit | Attending: Obstetrics | Admitting: Obstetrics

## 2015-01-01 DIAGNOSIS — F17210 Nicotine dependence, cigarettes, uncomplicated: Secondary | ICD-10-CM | POA: Diagnosis present

## 2015-01-01 DIAGNOSIS — O09523 Supervision of elderly multigravida, third trimester: Secondary | ICD-10-CM

## 2015-01-01 DIAGNOSIS — O99824 Streptococcus B carrier state complicating childbirth: Secondary | ICD-10-CM | POA: Diagnosis present

## 2015-01-01 DIAGNOSIS — Z3A39 39 weeks gestation of pregnancy: Secondary | ICD-10-CM | POA: Diagnosis present

## 2015-01-01 DIAGNOSIS — O99334 Smoking (tobacco) complicating childbirth: Principal | ICD-10-CM | POA: Diagnosis present

## 2015-01-01 HISTORY — DX: Depression, unspecified: F32.A

## 2015-01-01 HISTORY — DX: Major depressive disorder, single episode, unspecified: F32.9

## 2015-01-01 LAB — CBC
HCT: 34.7 % — ABNORMAL LOW (ref 36.0–46.0)
Hemoglobin: 11.9 g/dL — ABNORMAL LOW (ref 12.0–15.0)
MCH: 33.8 pg (ref 26.0–34.0)
MCHC: 34.3 g/dL (ref 30.0–36.0)
MCV: 98.6 fL (ref 78.0–100.0)
Platelets: 234 10*3/uL (ref 150–400)
RBC: 3.52 MIL/uL — AB (ref 3.87–5.11)
RDW: 14.1 % (ref 11.5–15.5)
WBC: 12.8 10*3/uL — ABNORMAL HIGH (ref 4.0–10.5)

## 2015-01-01 LAB — TYPE AND SCREEN
ABO/RH(D): O POS
Antibody Screen: NEGATIVE

## 2015-01-01 MED ORDER — EPHEDRINE 5 MG/ML INJ
10.0000 mg | INTRAVENOUS | Status: DC | PRN
  Filled 2015-01-01: qty 2

## 2015-01-01 MED ORDER — FLEET ENEMA 7-19 GM/118ML RE ENEM: 1.0000 | ENEMA | RECTAL | Status: DC | PRN

## 2015-01-01 MED ORDER — LIDOCAINE HCL (PF) 1 % IJ SOLN
30.0000 mL | INTRAMUSCULAR | Status: DC | PRN
  Filled 2015-01-01: qty 30

## 2015-01-01 MED ORDER — NALBUPHINE HCL 10 MG/ML IJ SOLN
10.0000 mg | INTRAMUSCULAR | Status: DC | PRN
  Administered 2015-01-01: 10 mg via INTRAVENOUS
  Filled 2015-01-01: qty 1

## 2015-01-01 MED ORDER — ONDANSETRON HCL 4 MG/2ML IJ SOLN: 4.0000 mg | Freq: Four times a day (QID) | INTRAMUSCULAR | Status: DC | PRN

## 2015-01-01 MED ORDER — PHENYLEPHRINE 40 MCG/ML (10ML) SYRINGE FOR IV PUSH (FOR BLOOD PRESSURE SUPPORT)
80.0000 ug | PREFILLED_SYRINGE | INTRAVENOUS | Status: DC | PRN
  Filled 2015-01-01: qty 2

## 2015-01-01 MED ORDER — SODIUM CHLORIDE 0.9 % IV SOLN
2.0000 g | Freq: Once | INTRAVENOUS | Status: AC
  Administered 2015-01-01: 2 g via INTRAVENOUS
  Filled 2015-01-01: qty 2000

## 2015-01-01 MED ORDER — LACTATED RINGERS IV SOLN
INTRAVENOUS | Status: DC
  Administered 2015-01-01 (×2): via INTRAVENOUS

## 2015-01-01 MED ORDER — PHENYLEPHRINE 40 MCG/ML (10ML) SYRINGE FOR IV PUSH (FOR BLOOD PRESSURE SUPPORT)
PREFILLED_SYRINGE | INTRAVENOUS | Status: AC
  Filled 2015-01-01: qty 20

## 2015-01-01 MED ORDER — NALBUPHINE HCL 10 MG/ML IJ SOLN
10.0000 mg | INTRAMUSCULAR | Status: DC | PRN
  Administered 2015-01-01: 10 mg via INTRAMUSCULAR
  Filled 2015-01-01: qty 1

## 2015-01-01 MED ORDER — LACTATED RINGERS IV SOLN: 500.0000 mL | INTRAVENOUS | Status: DC | PRN

## 2015-01-01 MED ORDER — OXYTOCIN BOLUS FROM INFUSION: 500.0000 mL | INTRAVENOUS | Status: DC

## 2015-01-01 MED ORDER — ACETAMINOPHEN 325 MG PO TABS: 650.0000 mg | ORAL_TABLET | ORAL | Status: DC | PRN

## 2015-01-01 MED ORDER — FENTANYL 2.5 MCG/ML BUPIVACAINE 1/10 % EPIDURAL INFUSION (WH - ANES)
INTRAMUSCULAR | Status: AC
  Filled 2015-01-01: qty 125

## 2015-01-01 MED ORDER — OXYCODONE-ACETAMINOPHEN 5-325 MG PO TABS: 2.0000 | ORAL_TABLET | ORAL | Status: DC | PRN

## 2015-01-01 MED ORDER — CITRIC ACID-SODIUM CITRATE 334-500 MG/5ML PO SOLN: 30.0000 mL | ORAL | Status: DC | PRN

## 2015-01-01 MED ORDER — OXYCODONE-ACETAMINOPHEN 5-325 MG PO TABS: 1.0000 | ORAL_TABLET | ORAL | Status: DC | PRN

## 2015-01-01 MED ORDER — OXYTOCIN 40 UNITS IN LACTATED RINGERS INFUSION - SIMPLE MED
62.5000 mL/h | INTRAVENOUS | Status: DC
  Filled 2015-01-01: qty 1000

## 2015-01-01 MED ORDER — FENTANYL 2.5 MCG/ML BUPIVACAINE 1/10 % EPIDURAL INFUSION (WH - ANES): 14.0000 mL/h | INTRAMUSCULAR | Status: DC | PRN

## 2015-01-01 MED ORDER — DIPHENHYDRAMINE HCL 50 MG/ML IJ SOLN: 12.5000 mg | INTRAMUSCULAR | Status: DC | PRN

## 2015-01-01 MED ORDER — LACTATED RINGERS IV SOLN
500.0000 mL | Freq: Once | INTRAVENOUS | Status: AC
  Administered 2015-01-01: 500 mL via INTRAVENOUS

## 2015-01-01 NOTE — Anesthesia Preprocedure Evaluation (Deleted)
Anesthesia Evaluation  Patient identified by MRN, date of birth, ID band Patient awake    Reviewed: Allergy & Precautions, NPO status , Patient's Chart, lab work & pertinent test results  History of Anesthesia Complications Negative for: history of anesthetic complications  Airway Mallampati: II  TM Distance: >3 FB Neck ROM: Full    Dental no notable dental hx. (+) Dental Advisory Given   Pulmonary Current Smoker,  breath sounds clear to auscultation  Pulmonary exam normal       Cardiovascular Exercise Tolerance: Good negative cardio ROS  Rhythm:Regular Rate:Normal     Neuro/Psych  Headaches, PSYCHIATRIC DISORDERS Anxiety Depression    GI/Hepatic negative GI ROS, Neg liver ROS,   Endo/Other  negative endocrine ROS  Renal/GU negative Renal ROS  negative genitourinary   Musculoskeletal negative musculoskeletal ROS (+)   Abdominal   Peds negative pediatric ROS (+)  Hematology negative hematology ROS (+)   Anesthesia Other Findings   Reproductive/Obstetrics (+) Pregnancy                             Anesthesia Physical Anesthesia Plan  ASA: II  Anesthesia Plan: Epidural   Post-op Pain Management:    Induction:   Airway Management Planned:   Additional Equipment:   Intra-op Plan:   Post-operative Plan:   Informed Consent: I have reviewed the patients History and Physical, chart, labs and discussed the procedure including the risks, benefits and alternatives for the proposed anesthesia with the patient or authorized representative who has indicated his/her understanding and acceptance.   Dental advisory given  Plan Discussed with:   Anesthesia Plan Comments:         Anesthesia Quick Evaluation

## 2015-01-01 NOTE — H&P (Signed)
Nina Horn is a 44 y.o. female presenting for UC's. Maternal Medical History:  Reason for admission: Contractions.   Fetal activity: Perceived fetal activity is normal.   Last perceived fetal movement was within the past hour.    Prenatal complications: no prenatal complications Prenatal Complications - Diabetes: none.    OB History    Gravida Para Term Preterm AB TAB SAB Ectopic Multiple Living   9 3 2 1 5 3 2  0 0 2     Past Medical History  Diagnosis Date  . Migraines   . Depression    Past Surgical History  Procedure Laterality Date  . Pilonidal cyst excision    . Wisdom tooth extraction     Family History: family history includes Skeletal dysplasia in her daughter. Social History:  reports that she has been smoking Cigarettes.  She has been smoking about 1.00 pack per day. She does not have any smokeless tobacco history on file. She reports that she does not drink alcohol or use illicit drugs.   Prenatal Transfer Tool  Maternal Diabetes: No Genetic Screening: AMA Maternal Ultrasounds/Referrals: Normal Fetal Ultrasounds or other Referrals:  Referred to Materal Fetal Medicine  Maternal Substance Abuse:  No Significant Maternal Medications:  None Significant Maternal Lab Results:  Lab values include: Group B Strep positive Other Comments:  None  Review of Systems  All other systems reviewed and are negative.   Dilation: 4.5 Effacement (%): 90 Station: -1 Exam by:: D Harris RN Blood pressure 99/59, pulse 81, temperature 98.3 F (36.8 C), resp. rate 16, height 5\' 5"  (1.651 m), weight 158 lb (71.668 kg), last menstrual period 04/03/2014, SpO2 100 %, unknown if currently breastfeeding. Maternal Exam:  Abdomen: Patient reports no abdominal tenderness. Fetal presentation: vertex  Pelvis: adequate for delivery.   Cervix: Cervix evaluated by digital exam.     Physical Exam  Nursing note and vitals reviewed. Constitutional: She is oriented to person,  place, and time. She appears well-developed and well-nourished.  HENT:  Head: Normocephalic and atraumatic.  Eyes: Conjunctivae are normal. Pupils are equal, round, and reactive to light.  Neck: Normal range of motion. Neck supple.  Cardiovascular: Normal rate and regular rhythm.   Respiratory: Effort normal.  GI: Soft.  Musculoskeletal: Normal range of motion.  Neurological: She is alert and oriented to person, place, and time.  Skin: Skin is warm and dry.  Psychiatric: She has a normal mood and affect. Her behavior is normal. Judgment and thought content normal.    Prenatal labs: ABO, Rh: O/Positive/-- (07/13 0000) Antibody: Negative (07/13 0000) Rubella: Immune (07/13 0000) RPR: Nonreactive (07/13 0000)  HBsAg:    HIV: Non-reactive (07/13 0000)  GBS:     Assessment/Plan: 39 weeks.  Active labor.  Admit.   Maurita Havener A 01/01/2015, 8:24 PM

## 2015-01-01 NOTE — Progress Notes (Signed)
Nina Horn is a 44 y.o. [id number] at 5895w0d by LMP admitted for active labor  Subjective:   Objective: BP 106/61 mmHg  Pulse 90  Temp(Src) 98.3 F (36.8 C)  Resp 18  Ht 5\' 5"  (1.651 m)  Wt 158 lb (71.668 kg)  BMI 26.29 kg/m2  SpO2 100%  LMP 04/03/2014      FHT:  FHR: 140 bpm, variability: moderate,  accelerations:  Present,  decelerations:  Absent UC:   regular, every 3-4 minutes SVE:   Dilation: 4.5 Effacement (%): 90 Station: -1 Exam by:: D Harris RN  Labs: Lab Results  Component Value Date   WBC 12.8* 01/01/2015   HGB 11.9* 01/01/2015   HCT 34.7* 01/01/2015   MCV 98.6 01/01/2015   PLT 234 01/01/2015    Assessment / Plan: Spontaneous labor, progressing normally  Labor: Progressing normally Preeclampsia:  n/a Fetal Wellbeing:  Category I Pain Control:  Nubain I/D:  n/a Anticipated MOD:  NSVD  HARPER,CHARLES A 01/01/2015, 8:53 PM

## 2015-01-01 NOTE — MAU Note (Addendum)
Woke up with some bloody/pinkish mucus this morning.  Had some bleeding this afternoon when got up. occasional contraction. Just some pinkish d/c noted now.  Was 3 on tues

## 2015-01-02 ENCOUNTER — Encounter (HOSPITAL_COMMUNITY): Payer: Self-pay

## 2015-01-02 LAB — CBC
HEMATOCRIT: 29.6 % — AB (ref 36.0–46.0)
Hemoglobin: 10 g/dL — ABNORMAL LOW (ref 12.0–15.0)
MCH: 33.4 pg (ref 26.0–34.0)
MCHC: 33.8 g/dL (ref 30.0–36.0)
MCV: 99 fL (ref 78.0–100.0)
Platelets: 201 10*3/uL (ref 150–400)
RBC: 2.99 MIL/uL — AB (ref 3.87–5.11)
RDW: 13.8 % (ref 11.5–15.5)
WBC: 20.5 10*3/uL — AB (ref 4.0–10.5)

## 2015-01-02 MED ORDER — OXYCODONE-ACETAMINOPHEN 5-325 MG PO TABS: 1.0000 | ORAL_TABLET | ORAL | Status: DC | PRN

## 2015-01-02 MED ORDER — LANOLIN HYDROUS EX OINT: TOPICAL_OINTMENT | CUTANEOUS | Status: DC | PRN

## 2015-01-02 MED ORDER — DIPHENHYDRAMINE HCL 25 MG PO CAPS: 25.0000 mg | ORAL_CAPSULE | Freq: Four times a day (QID) | ORAL | Status: DC | PRN

## 2015-01-02 MED ORDER — BENZOCAINE-MENTHOL 20-0.5 % EX AERO
1.0000 "application " | INHALATION_SPRAY | CUTANEOUS | Status: DC | PRN
  Administered 2015-01-03: 1 via TOPICAL
  Filled 2015-01-02: qty 56

## 2015-01-02 MED ORDER — TETANUS-DIPHTH-ACELL PERTUSSIS 5-2.5-18.5 LF-MCG/0.5 IM SUSP: 0.5000 mL | Freq: Once | INTRAMUSCULAR | Status: DC

## 2015-01-02 MED ORDER — ZOLPIDEM TARTRATE 5 MG PO TABS: 5.0000 mg | ORAL_TABLET | Freq: Every evening | ORAL | Status: DC | PRN

## 2015-01-02 MED ORDER — INFLUENZA VAC SPLIT QUAD 0.5 ML IM SUSY: 0.5000 mL | PREFILLED_SYRINGE | INTRAMUSCULAR | Status: DC

## 2015-01-02 MED ORDER — SENNOSIDES-DOCUSATE SODIUM 8.6-50 MG PO TABS
2.0000 | ORAL_TABLET | ORAL | Status: DC
  Administered 2015-01-03: 2 via ORAL
  Filled 2015-01-02: qty 2

## 2015-01-02 MED ORDER — SIMETHICONE 80 MG PO CHEW: 80.0000 mg | CHEWABLE_TABLET | ORAL | Status: DC | PRN

## 2015-01-02 MED ORDER — IBUPROFEN 600 MG PO TABS
600.0000 mg | ORAL_TABLET | Freq: Four times a day (QID) | ORAL | Status: DC
  Administered 2015-01-02 – 2015-01-03 (×7): 600 mg via ORAL
  Filled 2015-01-02 (×7): qty 1

## 2015-01-02 MED ORDER — DIBUCAINE 1 % RE OINT: 1.0000 "application " | TOPICAL_OINTMENT | RECTAL | Status: DC | PRN

## 2015-01-02 MED ORDER — PRENATAL MULTIVITAMIN CH
1.0000 | ORAL_TABLET | Freq: Every day | ORAL | Status: DC
  Administered 2015-01-02 – 2015-01-03 (×2): 1 via ORAL
  Filled 2015-01-02 (×2): qty 1

## 2015-01-02 MED ORDER — OXYTOCIN 40 UNITS IN LACTATED RINGERS INFUSION - SIMPLE MED: 62.5000 mL/h | INTRAVENOUS | Status: DC | PRN

## 2015-01-02 MED ORDER — ONDANSETRON HCL 4 MG PO TABS: 4.0000 mg | ORAL_TABLET | ORAL | Status: DC | PRN

## 2015-01-02 MED ORDER — OXYCODONE-ACETAMINOPHEN 5-325 MG PO TABS: 2.0000 | ORAL_TABLET | ORAL | Status: DC | PRN

## 2015-01-02 MED ORDER — WITCH HAZEL-GLYCERIN EX PADS: 1.0000 "application " | MEDICATED_PAD | CUTANEOUS | Status: DC | PRN

## 2015-01-02 MED ORDER — ONDANSETRON HCL 4 MG/2ML IJ SOLN
4.0000 mg | INTRAMUSCULAR | Status: DC | PRN
Start: 2015-01-02 — End: 2015-01-04

## 2015-01-02 NOTE — Progress Notes (Signed)
Post Partum Day 1 Subjective: no complaints  Objective: Blood pressure 102/51, pulse 74, temperature 98.5 F (36.9 C), resp. rate 18, height 5\' 5"  (1.651 m), weight 158 lb (71.668 kg), last menstrual period 04/03/2014, SpO2 100 %, unknown if currently breastfeeding.  Physical Exam:  General: alert and no distress Lochia: appropriate Uterine Fundus: firm Incision: none DVT Evaluation: No evidence of DVT seen on physical exam.   Recent Labs  01/01/15 1945 01/02/15 0500  HGB 11.9* 10.0*  HCT 34.7* 29.6*    Assessment/Plan: Plan for discharge tomorrow   LOS: 1 day   Saaya Procell A 01/02/2015, 3:11 PM

## 2015-01-02 NOTE — Lactation Note (Signed)
This note was copied from the chart of Boy Pandora LeiterMonique Palmatier. Lactation Consultation Note  Ex BF. Breastfed two other children for a year. Baby latched in cradle hold on left breast upon entering.  Encouraged depth. Mother hand expressed on right .  Good flow of colostrum. Sucks and some swallows observed.  Mother denies problems or questions. Mom encouraged to feed baby 8-12 times/24 hours and with feeding cues.  Mom made aware of O/P services, breastfeeding support groups, community resources, and our phone # for post-discharge questions.    Patient Name: Boy Pandora LeiterMonique Eskin ZOXWR'UToday's Date: 01/02/2015 Reason for consult: Initial assessment   Maternal Data Has patient been taught Hand Expression?: Yes Does the patient have breastfeeding experience prior to this delivery?: Yes  Feeding Feeding Type: Breast Fed (latched upon entering) Length of feed: 0 min  LATCH Score/Interventions Latch: Grasps breast easily, tongue down, lips flanged, rhythmical sucking.  Audible Swallowing: A few with stimulation  Type of Nipple: Everted at rest and after stimulation  Comfort (Breast/Nipple): Soft / non-tender     Hold (Positioning): No assistance needed to correctly position infant at breast.  LATCH Score: 9  Lactation Tools Discussed/Used     Consult Status Consult Status: Follow-up Date: 01/03/15 Follow-up type: In-patient    Dahlia ByesBerkelhammer, Ruth [facility] 01/02/2015, 1:50 PM

## 2015-01-02 NOTE — Progress Notes (Signed)
FOB standing in the hall stated that pt. Does not want him in the room. The pt. Stated she told the FOB she cannot sleep with him snoring in the room. Pt. Stated the FOB could return to the room

## 2015-01-03 LAB — RPR: RPR Ser Ql: NONREACTIVE

## 2015-01-03 MED ORDER — OXYCODONE-ACETAMINOPHEN 5-325 MG PO TABS: 2.0000 | ORAL_TABLET | ORAL | Status: DC | PRN

## 2015-01-03 MED ORDER — IBUPROFEN 600 MG PO TABS: 600.0000 mg | ORAL_TABLET | Freq: Four times a day (QID) | ORAL | Status: DC | PRN

## 2015-01-03 NOTE — Progress Notes (Signed)
Post Partum Day 2 Subjective: no complaints  Objective: Blood pressure 93/56, pulse 66, temperature 98.2 F (36.8 C), temperature source Oral, resp. rate 18, height 5\' 5"  (1.651 m), weight 158 lb (71.668 kg), last menstrual period 04/03/2014, SpO2 100 %, unknown if currently breastfeeding.  Physical Exam:  General: alert and no distress Lochia: appropriate Uterine Fundus: firm Incision: none DVT Evaluation: No evidence of DVT seen on physical exam.   Recent Labs  01/01/15 1945 01/02/15 0500  HGB 11.9* 10.0*  HCT 34.7* 29.6*    Assessment/Plan: Discharge home   LOS: 2 days   HARPER,CHARLES A 01/03/2015, 8:06 AM

## 2015-01-03 NOTE — Discharge Summary (Signed)
Obstetric Discharge Summary Reason for Admission: onset of labor Prenatal Procedures: ultrasound Intrapartum Procedures: spontaneous vaginal delivery Postpartum Procedures: none Complications-Operative and Postpartum: none HEMOGLOBIN  Date Value Ref Range Status  01/02/2015 10.0* 12.0 - 15.0 g/dL Final   HCT  Date Value Ref Range Status  01/02/2015 29.6* 36.0 - 46.0 % Final    Physical Exam:  General: alert and no distress Lochia: appropriate Uterine Fundus: firm Incision: none DVT Evaluation: No evidence of DVT seen on physical exam.  Discharge Diagnoses: Term Pregnancy-delivered  Discharge Information: Date: 01/03/2015 Activity: pelvic rest Diet: routine Medications: PNV, Ibuprofen, Colace and Percocet Condition: stable Instructions: refer to practice specific booklet Discharge to: home Follow-up Information    Follow up with MARSHALL,BERNARD A, MD In 2 weeks.   Specialty:  Obstetrics and Gynecology   Contact information:   [address] [place] [id number] [phone]       Newborn Data: Live born female  Birth Weight: 6 lb 13.9 oz (3116 g) APGAR: 9,   Home with mother.  HARPER,CHARLES A 01/03/2015, 8:09 AM

## 2015-01-03 NOTE — Lactation Note (Signed)
This note was copied from the chart of Nina Pandora LeiterMonique Vankuren. Lactation Consultation Note  Mother requested shells to catch leaking breastmilk. Tender and provided gels. Discussed engorgement and monitoring voids/stools. Since mother has a great milk supply and baby pulls off at the beginning of feeding, suggest she prepump to take off strong let down. Encouraged her to call if she has further questions.   Patient Name: Nina Horn ZOXWR'UToday's Date: 01/03/2015 Reason for consult: Follow-up assessment   Maternal Data    Feeding Feeding Type: Breast Fed Length of feed: 40 min  LATCH Score/Interventions                      Lactation Tools Discussed/Used     Consult Status Consult Status: Complete    Hardie PulleyBerkelhammer, Ilene Witcher Boschen 01/03/2015, 11:56 AM

## 2015-01-04 ENCOUNTER — Encounter (HOSPITAL_COMMUNITY): Payer: Self-pay | Admitting: *Deleted

## 2015-01-04 NOTE — Progress Notes (Signed)
Ur chart review completed.  

## 2016-12-27 ENCOUNTER — Encounter: Payer: Self-pay | Admitting: *Deleted

## 2018-07-27 ENCOUNTER — Encounter (HOSPITAL_COMMUNITY): Payer: Self-pay | Admitting: Emergency Medicine

## 2018-07-27 ENCOUNTER — Other Ambulatory Visit: Payer: Self-pay

## 2018-07-27 ENCOUNTER — Emergency Department (HOSPITAL_COMMUNITY)
Admission: EM | Admit: 2018-07-27 | Discharge: 2018-07-27 | Disposition: A | Discharge status: Home / Self Care | Payer: Medicaid Other | Attending: Emergency Medicine | Admitting: Emergency Medicine

## 2018-07-27 DIAGNOSIS — Z79899 Other long term (current) drug therapy: Secondary | ICD-10-CM | POA: Diagnosis not present

## 2018-07-27 DIAGNOSIS — R1013 Epigastric pain: Secondary | ICD-10-CM | POA: Diagnosis not present

## 2018-07-27 DIAGNOSIS — F17210 Nicotine dependence, cigarettes, uncomplicated: Secondary | ICD-10-CM | POA: Diagnosis not present

## 2018-07-27 LAB — URINALYSIS, ROUTINE W REFLEX MICROSCOPIC
BILIRUBIN URINE: NEGATIVE
Glucose, UA: NEGATIVE mg/dL
Hgb urine dipstick: NEGATIVE
Ketones, ur: 5 mg/dL — AB
Leukocytes, UA: NEGATIVE
NITRITE: NEGATIVE
PH: 6 (ref 5.0–8.0)
Protein, ur: NEGATIVE mg/dL
SPECIFIC GRAVITY, URINE: 1.02 (ref 1.005–1.030)

## 2018-07-27 LAB — I-STAT TROPONIN, ED: Troponin i, poc: 0.02 ng/mL (ref 0.00–0.08)

## 2018-07-27 LAB — COMPREHENSIVE METABOLIC PANEL
ALT: 10 U/L (ref 0–44)
AST: 16 U/L (ref 15–41)
Albumin: 3.6 g/dL (ref 3.5–5.0)
Alkaline Phosphatase: 67 U/L (ref 38–126)
Anion gap: 10 (ref 5–15)
BUN: 6 mg/dL (ref 6–20)
CO2: 26 mmol/L (ref 22–32)
Calcium: 9.1 mg/dL (ref 8.9–10.3)
Chloride: 104 mmol/L (ref 98–111)
Creatinine, Ser: 0.83 mg/dL (ref 0.44–1.00)
GFR calc Af Amer: >60 mL/min (ref >60)
GFR calc non Af Amer: >60 mL/min (ref >60)
GLUCOSE: 100 mg/dL — AB (ref 70–99)
Potassium: 3.5 mmol/L (ref 3.5–5.1)
SODIUM: 140 mmol/L (ref 135–145)
Total Bilirubin: 0.5 mg/dL (ref 0.3–1.2)
Total Protein: 6.3 g/dL — ABNORMAL LOW (ref 6.5–8.1)

## 2018-07-27 LAB — I-STAT BETA HCG BLOOD, ED (MC, WL, AP ONLY): I-stat hCG, quantitative: <5 m[IU]/mL (ref <5)

## 2018-07-27 LAB — CBC
HCT: 42.6 % (ref 36.0–46.0)
HEMOGLOBIN: 13.9 g/dL (ref 12.0–15.0)
MCH: 31.6 pg (ref 26.0–34.0)
MCHC: 32.6 g/dL (ref 30.0–36.0)
MCV: 96.8 fL (ref 78.0–100.0)
Platelets: 290 10*3/uL (ref 150–400)
RBC: 4.4 MIL/uL (ref 3.87–5.11)
RDW: 12.2 % (ref 11.5–15.5)
WBC: 11.9 10*3/uL — ABNORMAL HIGH (ref 4.0–10.5)

## 2018-07-27 LAB — LIPASE, BLOOD: LIPASE: 40 U/L (ref 11–51)

## 2018-07-27 MED ORDER — ONDANSETRON 4 MG PO TBDP
4.0000 mg | ORAL_TABLET | Freq: Once | ORAL | Status: AC
  Administered 2018-07-27: 4 mg via ORAL
  Filled 2018-07-27: qty 1

## 2018-07-27 MED ORDER — GI COCKTAIL ~~LOC~~
30.0000 mL | Freq: Once | ORAL | Status: AC
  Administered 2018-07-27: 30 mL via ORAL
  Filled 2018-07-27: qty 30

## 2018-07-27 MED ORDER — ONDANSETRON HCL 4 MG PO TABS: 4.0000 mg | ORAL_TABLET | Freq: Three times a day (TID) | ORAL | 0 refills | Status: DC | PRN

## 2018-07-27 MED ORDER — RANITIDINE HCL 150 MG PO TABS: 150.0000 mg | ORAL_TABLET | Freq: Two times a day (BID) | ORAL | 0 refills | Status: DC

## 2018-07-27 NOTE — ED Triage Notes (Signed)
Pt presents to ED for assessment of epigastric pain x 1 week with intermittent nausea, hot feeling, shooting pain across her abdomen, especially with eating.  States tums and a glass of milk are the only things to resolve it this week.

## 2018-07-27 NOTE — ED Notes (Signed)
Patient verbalizes understanding of discharge instructions. Opportunity for questioning and answers were provided.pt discharged from ED, ambulatory to the lobby.  

## 2018-07-27 NOTE — ED Provider Notes (Signed)
[facility] EMERGENCY DEPARTMENT Provider Note   CSN: [id number] Arrival date & time: 07/27/18  1500   History   Chief Complaint Chief Complaint  Patient presents with  . Abdominal Pain    HPI Nina Horn is a 47 y.o. female.  HPI  47 year old female with history of recurrent migraines and depression presents to the emergency department today for evaluation of epigastric abdominal pain.  She reports for the past week she has had abdominal pain that she describes as "a sour stomach".  Occasionally burning pain across her abdomen.  Despite triage note, patient states that this is not associated with eating.  Does not get better or worse with eating.  She has still had an appetite though nothing seems to taste good as she has had a sour taste in her mouth.  She has only had relief with Tums and milk. No history of PUD. She denies chronic alcohol use however over the past 2 weeks she has had a dental ache for which she has been taking Goody powder every day.  No other NSAIDs.  Denies any vomiting though has had some nausea with this.  No diarrhea.  No hematochezia or melena.  Denies any history of abdominal surgeries in the past.  Still passing gas and having bowel movements like it normally.  Past Medical History:  Diagnosis Date  . Depression   . Migraines     Patient Active Problem List   Diagnosis Date Noted  . NSVD (normal spontaneous vaginal delivery) 01/02/2015  . Normal labor 01/01/2015  . [redacted] weeks gestation of pregnancy   . Maternal age 49+, multigravida, antepartum   . Ultrasound scan to check interval growth of fetus   . Elderly multigravida with antepartum condition or complication 06/26/2014  . Previous child with anomaly, antepartum 06/26/2014  . DEPRESSION 10/25/2009  . MIGRAINE HEADACHE 10/25/2009    Past Surgical History:  Procedure Laterality Date  . PILONIDAL CYST EXCISION    . WISDOM TOOTH EXTRACTION       OB History    Gravida    9   Para  4   Term  3   Preterm  1   AB  5   Living  1     SAB  2   TAB  3   Ectopic  0   Multiple  0   Live Births  1            Home Medications    Prior to Admission medications   Medication Sig Start Date End Date Taking? Authorizing Provider  ferrous sulfate 325 (65 FE) MG tablet Take 325 mg by mouth daily with breakfast.    [provider]  ibuprofen (ADVIL,MOTRIN) 600 MG tablet Take 1 tablet (600 mg total) by mouth every 6 (six) hours as needed for mild pain. 01/03/15   Brock Bad, MD  ondansetron (ZOFRAN) 4 MG tablet Take 1 tablet (4 mg total) by mouth every 8 (eight) hours as needed for nausea or vomiting. 07/27/18   Rigoberto Noel, MD  oxyCODONE-acetaminophen (PERCOCET/ROXICET) 5-325 MG per tablet Take 2 tablets by mouth every 4 (four) hours as needed for moderate pain or severe pain (for pain scale equal to or greater than 7). 01/03/15   Brock Bad, MD  Prenatal Vit-Fe Fumarate-FA (PRENATAL VITAMIN PO) Take 1 tablet by mouth daily.     [provider]  ranitidine (ZANTAC) 150 MG tablet Take 1 tablet (150 mg total) by mouth  2 (two) times daily. 07/27/18   Rigoberto Noelickens, Arsal Tappan, MD    Family History Family History  Problem Relation Age of Onset  . Skeletal dysplasia Daughter        Thanatophoric dysplasia    Social History Social History   Tobacco Use  . Smoking status: Current Every Day Smoker    Packs/day: 1.00    Types: Cigarettes  . Smokeless tobacco: Never Used  Substance Use Topics  . Alcohol use: No  . Drug use: Yes    Types: Marijuana     Allergies   Hydrocodone and Promethazine hcl   Review of Systems Review of Systems  Constitutional: Negative for chills and fever.  HENT: Negative for congestion and sore throat.   Eyes: Negative for visual disturbance.  Respiratory: Negative for cough and shortness of breath.   Cardiovascular: Negative for chest pain and leg swelling.  Gastrointestinal: Positive for  abdominal pain (epigastric burning & "sour") and nausea. Negative for blood in stool, diarrhea and vomiting.  Genitourinary: Negative for dysuria and hematuria.  Musculoskeletal: Negative for back pain and neck pain.  Skin: Negative for color change and rash.  Neurological: Negative for weakness and headaches.  All other systems reviewed and are negative.    Physical Exam Updated Vital Signs BP 100/71   Pulse 62   Temp 98.3 F (36.8 C) (Oral)   Resp 18   Ht 5\' 3"  (1.6 m)   Wt 62.6 kg   LMP 07/27/2018   SpO2 98%   BMI 24.45 kg/m   Physical Exam  Constitutional: She appears well-developed and well-nourished. No distress.  HENT:  Head: Normocephalic and atraumatic.  Eyes: Conjunctivae are normal.  Neck: Neck supple.  Cardiovascular: Regular rhythm and intact distal pulses.  Pulmonary/Chest: Effort normal and breath sounds normal. No respiratory distress.  Abdominal: Soft. She exhibits no distension. There is no tenderness.  No reproducible tenderness. Negative murphy  Musculoskeletal: She exhibits no edema.  Neurological: She is alert.  Skin: Skin is warm and dry. Capillary refill takes less than 2 seconds.  Psychiatric: She has a normal mood and affect.  Nursing note and vitals reviewed.    ED Treatments / Results  Labs (all labs ordered are listed, but only abnormal results are displayed) Labs Reviewed  COMPREHENSIVE METABOLIC PANEL - Abnormal; Notable for the following components:      Result Value   Glucose, Bld 100 (*)    Total Protein 6.3 (*)    All other components within normal limits  CBC - Abnormal; Notable for the following components:   WBC 11.9 (*)    All other components within normal limits  URINALYSIS, ROUTINE W REFLEX MICROSCOPIC - Abnormal; Notable for the following components:   Ketones, ur 5 (*)    All other components within normal limits  LIPASE, BLOOD  I-STAT BETA HCG BLOOD, ED ([facility], [facility], [facility] ONLY)  I-STAT TROPONIN, ED    EKG EKG  Interpretation  Date/Time:  Saturday July 27 2018 15:11:31 EDT Ventricular Rate:  79 PR Interval:  146 QRS Duration: 80 QT Interval:  352 QTC Calculation: 403 R Axis:   83 Text Interpretation:  Normal sinus rhythm No previous tracing Confirmed by Cathren LaineSteinl, Kevin ([id number]) on 07/27/2018 4:26:23 PM   Radiology No results found.  Procedures Procedures (including critical care time)  Medications Ordered in ED Medications  gi cocktail (Maalox,Lidocaine,Donnatal) (30 mLs Oral Given 07/27/18 1709)  ondansetron (ZOFRAN-ODT) disintegrating tablet 4 mg (4 mg Oral Given 07/27/18 1726)  gi cocktail (Maalox,Lidocaine,Donnatal) (  30 mLs Oral Given 07/27/18 1758)     Initial Impression / Assessment and Plan / ED Course  I have reviewed the triage vital signs and the nursing notes.  Pertinent labs & imaging results that were available during my care of the patient were reviewed by me and considered in my medical decision making (see chart for details).    47 year old female with history of recurrent migraines and depression presents to the emergency department today for evaluation of epigastric abdominal pain.    Patient is afebrile, hemodynamically stable here in the emergency department.  Has reassuring abdominal exam with no peritonitis or reproducible tenderness. Will evaluate for pancreatitis. Higher on differential is gastritis/PUD. No findings to suggest perforation. No RLQ pain or symptoms of appendicitis. No RUQ pain and negative murphy, doubt biliary colic/cholecystitis. Not consistent with SBO. No CP/SOA to suggest atypical ACS presentation.  ECG obtained shows rate approximately 80, NSR, normal axis, intervals within normal limits, does have left atrial enlargement with no other acute findings.  No evidence of ischemic changes.  No significant change when compared to prior.  Labs obtained in quick look process including CMP, lipase, CBC reviewed.  They are remarkable only for mild  leukocytosis of 11.9 that is nonspecific.  Stable hemoglobin and platelets.  Lipase is within normal limits.  No transaminitis or hyperbilirubinemia.  Renal function unremarkable.  No significant electrolyte derangements.  Troponin obtained in quick look process is within normal limits.  I do not think this is ACS nor does it warrant delta troponin at this time.  Urine studies are unremarkable.  Patient given GI cocktail for pain with improvement in symptoms.  Findings most consistent with gastritis or peptic ulcer disease.  Counseled on [place] powder/NSAID use.  Will start patient on Zantac and advise close follow-up with PCP.  If symptoms persist she is advised to follow-up with her GI for upper endoscopy.  Patient comfortable with plan.  Stable at discharge.   Case and plan of care discussed with Dr. Denton Lank.  Final Clinical Impressions(s) / ED Diagnoses   Final diagnoses:  Epigastric abdominal pain    ED Discharge Orders         Ordered    ranitidine (ZANTAC) 150 MG tablet  2 times daily     07/27/18 1759    ondansetron (ZOFRAN) 4 MG tablet  Every 8 hours PRN     07/27/18 1800           Rigoberto Noel, MD 07/27/18 1800    Cathren Laine, MD 07/27/18 Windell Moment

## 2018-07-27 NOTE — ED Notes (Signed)
Pt had one episode of emesis. EDP aware, verbal order zofran and will re-attempt GI cocktail.

## 2018-10-13 ENCOUNTER — Other Ambulatory Visit: Payer: Self-pay

## 2018-10-13 ENCOUNTER — Emergency Department (HOSPITAL_COMMUNITY)
Admission: EM | Admit: 2018-10-13 | Discharge: 2018-10-13 | Disposition: A | Discharge status: Home / Self Care | Payer: Medicaid Other | Attending: Emergency Medicine | Admitting: Emergency Medicine

## 2018-10-13 ENCOUNTER — Emergency Department (HOSPITAL_COMMUNITY): Payer: Medicaid Other

## 2018-10-13 ENCOUNTER — Encounter (HOSPITAL_COMMUNITY): Payer: Self-pay

## 2018-10-13 DIAGNOSIS — F17210 Nicotine dependence, cigarettes, uncomplicated: Secondary | ICD-10-CM | POA: Insufficient documentation

## 2018-10-13 DIAGNOSIS — R0602 Shortness of breath: Secondary | ICD-10-CM | POA: Diagnosis not present

## 2018-10-13 DIAGNOSIS — R059 Cough, unspecified: Secondary | ICD-10-CM

## 2018-10-13 DIAGNOSIS — J9801 Acute bronchospasm: Secondary | ICD-10-CM | POA: Diagnosis not present

## 2018-10-13 DIAGNOSIS — R0789 Other chest pain: Secondary | ICD-10-CM | POA: Diagnosis not present

## 2018-10-13 DIAGNOSIS — R05 Cough: Secondary | ICD-10-CM | POA: Diagnosis not present

## 2018-10-13 LAB — CBC WITH DIFFERENTIAL/PLATELET
Abs Immature Granulocytes: 0.04 10*3/uL (ref 0.00–0.07)
BASOS ABS: 0 10*3/uL (ref 0.0–0.1)
Basophils Relative: 0 %
Eosinophils Absolute: 0.1 10*3/uL (ref 0.0–0.5)
Eosinophils Relative: 0 %
HEMATOCRIT: 38.1 % (ref 36.0–46.0)
Hemoglobin: 12.7 g/dL (ref 12.0–15.0)
IMMATURE GRANULOCYTES: 0 %
LYMPHS ABS: 2.8 10*3/uL (ref 0.7–4.0)
Lymphocytes Relative: 25 %
MCH: 32 pg (ref 26.0–34.0)
MCHC: 33.3 g/dL (ref 30.0–36.0)
MCV: 96 fL (ref 80.0–100.0)
Monocytes Absolute: 0.9 10*3/uL (ref 0.1–1.0)
Monocytes Relative: 8 %
NEUTROS PCT: 67 %
NRBC: 0 % (ref 0.0–0.2)
Neutro Abs: 7.4 10*3/uL (ref 1.7–7.7)
Platelets: 235 10*3/uL (ref 150–400)
RBC: 3.97 MIL/uL (ref 3.87–5.11)
RDW: 12.6 % (ref 11.5–15.5)
WBC: 11.2 10*3/uL — ABNORMAL HIGH (ref 4.0–10.5)

## 2018-10-13 LAB — BASIC METABOLIC PANEL
ANION GAP: 10 (ref 5–15)
BUN: 7 mg/dL (ref 6–20)
CALCIUM: 8.5 mg/dL — AB (ref 8.9–10.3)
CO2: 23 mmol/L (ref 22–32)
Chloride: 105 mmol/L (ref 98–111)
Creatinine, Ser: 0.76 mg/dL (ref 0.44–1.00)
GFR calc non Af Amer: >60 mL/min (ref >60)
Glucose, Bld: 108 mg/dL — ABNORMAL HIGH (ref 70–99)
Potassium: 3.4 mmol/L — ABNORMAL LOW (ref 3.5–5.1)
Sodium: 138 mmol/L (ref 135–145)

## 2018-10-13 LAB — I-STAT BETA HCG BLOOD, ED (NOT ORDERABLE): I-stat hCG, quantitative: <5 m[IU]/mL (ref <5)

## 2018-10-13 MED ORDER — PREDNISONE 20 MG PO TABS
60.0000 mg | ORAL_TABLET | Freq: Once | ORAL | Status: AC
  Administered 2018-10-13: 60 mg via ORAL
  Filled 2018-10-13: qty 3

## 2018-10-13 MED ORDER — PREDNISONE 20 MG PO TABS: 60.0000 mg | ORAL_TABLET | Freq: Every day | ORAL | 0 refills | Status: AC

## 2018-10-13 MED ORDER — IPRATROPIUM BROMIDE 0.02 % IN SOLN
0.5000 mg | Freq: Once | RESPIRATORY_TRACT | Status: AC
  Administered 2018-10-13: 0.5 mg via RESPIRATORY_TRACT
  Filled 2018-10-13: qty 2.5

## 2018-10-13 MED ORDER — AEROCHAMBER PLUS FLO-VU MISC
1.0000 | Freq: Once | Status: AC
  Administered 2018-10-13: 1
  Filled 2018-10-13: qty 1

## 2018-10-13 MED ORDER — IPRATROPIUM-ALBUTEROL 0.5-2.5 (3) MG/3ML IN SOLN
3.0000 mL | Freq: Once | RESPIRATORY_TRACT | Status: AC
  Administered 2018-10-13: 3 mL via RESPIRATORY_TRACT
  Filled 2018-10-13: qty 3

## 2018-10-13 MED ORDER — ALBUTEROL SULFATE HFA 108 (90 BASE) MCG/ACT IN AERS
2.0000 | INHALATION_SPRAY | Freq: Once | RESPIRATORY_TRACT | Status: AC
  Administered 2018-10-13: 2 via RESPIRATORY_TRACT
  Filled 2018-10-13: qty 6.7

## 2018-10-13 MED ORDER — ALBUTEROL (5 MG/ML) CONTINUOUS INHALATION SOLN
10.0000 mg/h | INHALATION_SOLUTION | RESPIRATORY_TRACT | Status: DC
Start: 2018-10-13 — End: 2018-10-14
  Administered 2018-10-13: 10 mg/h via RESPIRATORY_TRACT
  Filled 2018-10-13: qty 20

## 2018-10-13 NOTE — ED Provider Notes (Signed)
[facility]-EMERGENCY DEPT Provider Note   CSN: [id number] Arrival date & time: 10/13/18  1455     History   Chief Complaint Chief Complaint  Patient presents with  . URI  . Chills    HPI Nina Horn is a 47 y.o. female.  Nina Horn is a 47 y.o. Female with a history of migraines, depression and tobacco abuse, who presents to the emergency department for evaluation of cough, chills and shortness of breath.  Patient reports symptoms have been present and worsening over the past week.  Patient reports cough is occasionally productive of mucus but is usually a dry hacking cough.  She has not had any associated nasal drainage or congestion, denies sore throat.  She has had persistent chills but has not been febrile.  She denies any associated chest pain.  Reports her breathing has gotten worse especially over the past 2 to 3 days she feels like she cannot take a deep breath in her chest feels tight.  She denies associated nausea, vomiting diarrhea or abdominal pain.  She is been taking Mucinex which seemed to help with cough, but nothing is helped her shortness of breath.  She used to use inhalers but has not had any at home to try to help treat her current symptoms.  Denies any other aggravating or alleviating factors.     Past Medical History:  Diagnosis Date  . Depression   . Migraines     Patient Active Problem List   Diagnosis Date Noted  . NSVD (normal spontaneous vaginal delivery) 01/02/2015  . Normal labor 01/01/2015  . [redacted] weeks gestation of pregnancy   . Maternal age 58+, multigravida, antepartum   . Ultrasound scan to check interval growth of fetus   . Elderly multigravida with antepartum condition or complication 06/26/2014  . Previous child with anomaly, antepartum 06/26/2014  . DEPRESSION 10/25/2009  . MIGRAINE HEADACHE 10/25/2009    Past Surgical History:  Procedure Laterality Date  . PILONIDAL CYST EXCISION    . WISDOM  TOOTH EXTRACTION       OB History    Gravida  9   Para  4   Term  3   Preterm  1   AB  5   Living  1     SAB  2   TAB  3   Ectopic  0   Multiple  0   Live Births  1            Home Medications    Prior to Admission medications   Medication Sig Start Date End Date Taking? Authorizing Provider  acetaminophen (TYLENOL) 500 MG tablet Take 1,000 mg by mouth every 6 (six) hours as needed for moderate pain.   Yes [provider]  dextromethorphan-guaiFENesin (MUCINEX DM) 30-600 MG 12hr tablet Take 2 tablets by mouth 2 (two) times daily as needed for cough.   Yes [provider]  ondansetron (ZOFRAN) 4 MG tablet Take 1 tablet (4 mg total) by mouth every 8 (eight) hours as needed for nausea or vomiting. 07/27/18  Yes Rigoberto Noel, MD  ibuprofen (ADVIL,MOTRIN) 600 MG tablet Take 1 tablet (600 mg total) by mouth every 6 (six) hours as needed for mild pain. Patient not taking: Reported on 10/13/2018 01/03/15   Brock Bad, MD  oxyCODONE-acetaminophen (PERCOCET/ROXICET) 5-325 MG per tablet Take 2 tablets by mouth every 4 (four) hours as needed for moderate pain or severe pain (for pain scale equal to or  greater than 7). Patient not taking: Reported on 10/13/2018 01/03/15   Brock Bad, MD  predniSONE (DELTASONE) 20 MG tablet Take 3 tablets (60 mg total) by mouth daily for 5 days. 10/13/18 10/18/18  Dartha Lodge, PA-C  ranitidine (ZANTAC) 150 MG tablet Take 1 tablet (150 mg total) by mouth 2 (two) times daily. Patient not taking: Reported on 10/13/2018 07/27/18   Rigoberto Noel, MD    Family History Family History  Problem Relation Age of Onset  . Skeletal dysplasia Daughter        Thanatophoric dysplasia    Social History Social History   Tobacco Use  . Smoking status: Current Every Day Smoker    Packs/day: 1.00    Types: Cigarettes  . Smokeless tobacco: Never Used  Substance Use Topics  . Alcohol use: No  . Drug use: Yes    Types:  Marijuana     Allergies   Hydrocodone and Promethazine hcl   Review of Systems Review of Systems  Constitutional: Positive for chills. Negative for fever.  HENT: Negative.   Eyes: Negative for visual disturbance.  Respiratory: Positive for cough, chest tightness, shortness of breath and wheezing.   Cardiovascular: Negative for chest pain.  Gastrointestinal: Negative for abdominal pain, nausea and vomiting.  Genitourinary: Negative for dysuria.  Musculoskeletal: Negative for arthralgias and myalgias.  Skin: Negative for color change and rash.  Neurological: Negative for dizziness, syncope and light-headedness.     Physical Exam Updated Vital Signs BP 105/81 (BP Location: Right Arm)   Pulse 87   Temp 99.3 F (37.4 C) (Oral)   Resp 18   Ht 5\' 3"  (1.6 m)   Wt 59.9 kg   LMP 10/11/2018   SpO2 95%   BMI 23.38 kg/m   Physical Exam  Constitutional: She is oriented to person, place, and time. She appears well-developed and well-nourished. No distress.  HENT:  Head: Normocephalic and atraumatic.  Mouth/Throat: Oropharynx is clear and moist.  Eyes: Right eye exhibits no discharge. Left eye exhibits no discharge.  Neck: Neck supple. No JVD present. No tracheal deviation present.  Cardiovascular: Normal rate, regular rhythm, normal heart sounds and intact distal pulses. Exam reveals no gallop and no friction rub.  No murmur heard. Pulmonary/Chest: Effort normal. No respiratory distress. She has wheezes.  Respirations equal and unlabored and patient able to speak in full sentences, lungs with bilateral wheezes and rhonchi throughout with decreased breath sounds  Abdominal: Soft. Bowel sounds are normal. She exhibits no distension and no mass. There is no tenderness. There is no guarding.  Abdomen soft, nondistended, nontender to palpation in all quadrants without guarding or peritoneal signs  Musculoskeletal: She exhibits no edema.  Neurological: She is alert and oriented to  person, place, and time. Coordination normal.  Skin: Skin is warm and dry. Capillary refill takes less than 2 seconds. She is not diaphoretic.  Psychiatric: She has a normal mood and affect. Her behavior is normal.  Nursing note and vitals reviewed.    ED Treatments / Results  Labs (all labs ordered are listed, but only abnormal results are displayed) Labs Reviewed  BASIC METABOLIC PANEL - Abnormal; Notable for the following components:      Result Value   Potassium 3.4 (*)    Glucose, Bld 108 (*)    Calcium 8.5 (*)    All other components within normal limits  CBC WITH DIFFERENTIAL/PLATELET - Abnormal; Notable for the following components:   WBC 11.2 (*)    All  other components within normal limits  I-STAT BETA HCG BLOOD, ED ([facility], [facility], [facility] ONLY)  I-STAT BETA HCG BLOOD, ED (NOT ORDERABLE)    EKG None  Radiology Dg Chest 2 View  Result Date: 10/13/2018 CLINICAL DATA:  47 year old female with history of cough and chills for 1 week. EXAM: CHEST - 2 VIEW COMPARISON:  No priors. FINDINGS: Lung volumes are normal. No consolidative airspace disease. No pleural effusions. No pneumothorax. No pulmonary nodule or mass noted. Pulmonary vasculature and the cardiomediastinal silhouette are within normal limits. IMPRESSION: No radiographic evidence of acute cardiopulmonary disease. Electronically Signed   By: Trudie Reed M.D.   On: 10/13/2018 17:04    Procedures Procedures (including critical care time)  Medications Ordered in ED Medications  albuterol (PROVENTIL,VENTOLIN) solution continuous neb (0 mg/hr Nebulization Stopped 10/13/18 1936)  predniSONE (DELTASONE) tablet 60 mg (60 mg Oral Given 10/13/18 1750)  ipratropium-albuterol (DUONEB) 0.5-2.5 (3) MG/3ML nebulizer solution 3 mL (3 mLs Nebulization Given 10/13/18 1751)  ipratropium (ATROVENT) nebulizer solution 0.5 mg (0.5 mg Nebulization Given 10/13/18 1836)  albuterol (PROVENTIL HFA;VENTOLIN HFA) 108 (90 Base) MCG/ACT inhaler 2  puff (2 puffs Inhalation Given 10/13/18 2131)  aerochamber plus with mask device 1 each (1 each Other Given 10/13/18 2131)     Initial Impression / Assessment and Plan / ED Course  I have reviewed the triage vital signs and the nursing notes.  Pertinent labs & imaging results that were available during my care of the patient were reviewed by me and considered in my medical decision making (see chart for details).  Patient presents to the emergency department for evaluation of cough and chills for the past week with worsening shortness of breath.  Patient does not have formal diagnosis of COPD but has smoked a pack per day for many years and reports she has had to use inhalers intermittently in the past.  On exam patient with normal vitals and in no acute distress.  Lungs with diffuse wheezes and rhonchi throughout bilateral lung fields and some decreased air movement.  She denies any associated chest pain.  Chest x-ray ordered from triage shows no evidence of acute pneumonia or other cardiopulmonary disease.  We will get basic labs, EKG and give prednisone breathing treatment and reevaluate.  Suspect COPD exacerbation.  Labs overall reassuring minimal leukocytosis of 11.2, normal hemoglobin, no acute electrolyte derangements requiring intervention and normal renal function, negative pregnancy.  After first breathing treatment patient reports some improvement in breathing but she still has significant wheezing and rhonchi throughout, will give 1 hour of continuous albuterol nebulizers and reevaluate.  After 1 hour of CAT, lungs significantly improved with only faint scattered wheezes, patient was able to ambulate throughout the department maintaining O2 saturations greater than 90 and reports she is feeling much better.  Will discharge home with 5-day burst of steroids, provided albuterol inhaler here in the emergency department.  Strict return precautions discussed with the patient she is to  follow-up with her primary care provider.  At this time she is stable for discharge home.  Expresses understanding and agreement with this plan.  Final Clinical Impressions(s) / ED Diagnoses   Final diagnoses:  Bronchospasm  Cough  SOB (shortness of breath)    ED Discharge Orders         Ordered    predniSONE (DELTASONE) 20 MG tablet  Daily     10/13/18 2117           Dartha Lodge, [place] 10/13/18 2259  Azalia Bilis, MD 10/13/18 2328

## 2018-10-13 NOTE — ED Notes (Signed)
Pt ambulated on pulse oximetry. Her oxygen saturation before standing was 94-95%. While ambulating, her oxygen saturation dropped down to as low as 90%, and it stayed around 91-92%.

## 2018-10-13 NOTE — ED Triage Notes (Addendum)
Pt states cough and chills for 1 week. Pt states that there has been 'rattling' in her lungs. Pt states that her appetite has been decreased, and her urinary output has as well. Pt states that Mucinex extra strength helped her cough let up, but still feels unwell.

## 2018-10-13 NOTE — Discharge Instructions (Signed)
Symptoms are likely due to inflammation in your lungs causing wheezing and cough, please take steroids as directed for the next 5 days, use your albuterol inhaler every 4-6 hours for the next 24 hours and then as needed, please use the phone number on your paperwork today to establish care with a new primary care doctor.  Return to the emergency department if you have worsening shortness of breath or difficulty breathing, chest pain, fevers, cough or any other new or concerning symptoms.

## 2019-09-30 ENCOUNTER — Other Ambulatory Visit: Payer: Self-pay

## 2019-09-30 ENCOUNTER — Emergency Department (HOSPITAL_COMMUNITY)
Admission: EM | Admit: 2019-09-30 | Discharge: 2019-10-01 | Discharge status: Against Medical Advice | Payer: Medicaid Other | Attending: Emergency Medicine | Admitting: Emergency Medicine

## 2019-09-30 ENCOUNTER — Encounter (HOSPITAL_COMMUNITY): Payer: Self-pay | Admitting: Emergency Medicine

## 2019-09-30 DIAGNOSIS — Z5321 Procedure and treatment not carried out due to patient leaving prior to being seen by health care provider: Secondary | ICD-10-CM | POA: Diagnosis not present

## 2019-09-30 DIAGNOSIS — R519 Headache, unspecified: Secondary | ICD-10-CM | POA: Diagnosis present
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
## 2019-10-01 NOTE — ED Notes (Signed)
Patient states her sitter cant stay with her kids so she has to leave

## 2020-02-25 DIAGNOSIS — H16223 Keratoconjunctivitis sicca, not specified as Sjogren's, bilateral: Secondary | ICD-10-CM | POA: Diagnosis not present

## 2020-02-25 DIAGNOSIS — H40033 Anatomical narrow angle, bilateral: Secondary | ICD-10-CM | POA: Diagnosis not present

## 2020-03-14 DIAGNOSIS — H5213 Myopia, bilateral: Secondary | ICD-10-CM | POA: Diagnosis not present

## 2020-03-27 DIAGNOSIS — H5213 Myopia, bilateral: Secondary | ICD-10-CM | POA: Diagnosis not present

## 2020-03-27 DIAGNOSIS — H1013 Acute atopic conjunctivitis, bilateral: Secondary | ICD-10-CM | POA: Diagnosis not present

## 2020-04-05 ENCOUNTER — Emergency Department (HOSPITAL_COMMUNITY)
Admission: EM | Admit: 2020-04-05 | Discharge: 2020-04-05 | Disposition: A | Discharge status: Home / Self Care | Payer: Medicaid Other | Attending: Emergency Medicine | Admitting: Emergency Medicine

## 2020-04-05 ENCOUNTER — Encounter (HOSPITAL_COMMUNITY): Payer: Self-pay | Admitting: *Deleted

## 2020-04-05 ENCOUNTER — Other Ambulatory Visit: Payer: Self-pay

## 2020-04-05 DIAGNOSIS — R112 Nausea with vomiting, unspecified: Secondary | ICD-10-CM | POA: Diagnosis not present

## 2020-04-05 DIAGNOSIS — F17210 Nicotine dependence, cigarettes, uncomplicated: Secondary | ICD-10-CM | POA: Insufficient documentation

## 2020-04-05 DIAGNOSIS — K279 Peptic ulcer, site unspecified, unspecified as acute or chronic, without hemorrhage or perforation: Secondary | ICD-10-CM | POA: Insufficient documentation

## 2020-04-05 DIAGNOSIS — F1210 Cannabis abuse, uncomplicated: Secondary | ICD-10-CM | POA: Insufficient documentation

## 2020-04-05 DIAGNOSIS — R1013 Epigastric pain: Secondary | ICD-10-CM

## 2020-04-05 LAB — CBC
HCT: 43.8 % (ref 36.0–46.0)
Hemoglobin: 14.2 g/dL (ref 12.0–15.0)
MCH: 31.2 pg (ref 26.0–34.0)
MCHC: 32.4 g/dL (ref 30.0–36.0)
MCV: 96.3 fL (ref 80.0–100.0)
Platelets: 298 10*3/uL (ref 150–400)
RBC: 4.55 MIL/uL (ref 3.87–5.11)
RDW: 12.7 % (ref 11.5–15.5)
WBC: 11 10*3/uL — ABNORMAL HIGH (ref 4.0–10.5)
nRBC: 0 % (ref 0.0–0.2)

## 2020-04-05 LAB — I-STAT BETA HCG BLOOD, ED (MC, WL, AP ONLY): I-stat hCG, quantitative: 6.4 m[IU]/mL — ABNORMAL HIGH (ref <5)

## 2020-04-05 LAB — COMPREHENSIVE METABOLIC PANEL
ALT: 12 U/L (ref 0–44)
AST: 18 U/L (ref 15–41)
Albumin: 4 g/dL (ref 3.5–5.0)
Alkaline Phosphatase: 60 U/L (ref 38–126)
Anion gap: 10 (ref 5–15)
BUN: 10 mg/dL (ref 6–20)
CO2: 27 mmol/L (ref 22–32)
Calcium: 9.3 mg/dL (ref 8.9–10.3)
Chloride: 104 mmol/L (ref 98–111)
Creatinine, Ser: 0.81 mg/dL (ref 0.44–1.00)
GFR calc Af Amer: >60 mL/min (ref >60)
GFR calc non Af Amer: >60 mL/min (ref >60)
Glucose, Bld: 105 mg/dL — ABNORMAL HIGH (ref 70–99)
Potassium: 3.9 mmol/L (ref 3.5–5.1)
Sodium: 141 mmol/L (ref 135–145)
Total Bilirubin: 0.4 mg/dL (ref 0.3–1.2)
Total Protein: 6.8 g/dL (ref 6.5–8.1)

## 2020-04-05 LAB — LIPASE, BLOOD: Lipase: 28 U/L (ref 11–51)

## 2020-04-05 LAB — URINALYSIS, ROUTINE W REFLEX MICROSCOPIC
Bilirubin Urine: NEGATIVE
Glucose, UA: NEGATIVE mg/dL
Hgb urine dipstick: NEGATIVE
Ketones, ur: 5 mg/dL — AB
Leukocytes,Ua: NEGATIVE
Nitrite: NEGATIVE
Protein, ur: NEGATIVE mg/dL
Specific Gravity, Urine: 1.024 (ref 1.005–1.030)
pH: 6 (ref 5.0–8.0)

## 2020-04-05 LAB — TROPONIN I (HIGH SENSITIVITY): Troponin I (High Sensitivity): 2 ng/L (ref <18)

## 2020-04-05 MED ORDER — ALUM & MAG HYDROXIDE-SIMETH 200-200-20 MG/5ML PO SUSP
30.0000 mL | Freq: Once | ORAL | Status: AC
  Administered 2020-04-05: 13:00:00 30 mL via ORAL
  Filled 2020-04-05: qty 30

## 2020-04-05 MED ORDER — SUCRALFATE 1 G PO TABS: 1.0000 g | ORAL_TABLET | Freq: Three times a day (TID) | ORAL | 1 refills | Status: DC

## 2020-04-05 MED ORDER — FENTANYL CITRATE (PF) 100 MCG/2ML IJ SOLN
50.0000 ug | Freq: Once | INTRAMUSCULAR | Status: AC
  Administered 2020-04-05: 50 ug via INTRAVENOUS
  Filled 2020-04-05: qty 2

## 2020-04-05 MED ORDER — ONDANSETRON HCL 4 MG/2ML IJ SOLN
4.0000 mg | Freq: Once | INTRAMUSCULAR | Status: AC
  Administered 2020-04-05: 13:00:00 4 mg via INTRAVENOUS
  Filled 2020-04-05: qty 2

## 2020-04-05 MED ORDER — PANTOPRAZOLE SODIUM 40 MG PO TBEC: 40.0000 mg | DELAYED_RELEASE_TABLET | Freq: Every day | ORAL | 1 refills | Status: DC

## 2020-04-05 MED ORDER — ONDANSETRON 4 MG PO TBDP
4.0000 mg | ORAL_TABLET | Freq: Once | ORAL | Status: AC
  Administered 2020-04-05: 13:00:00 4 mg via ORAL
  Filled 2020-04-05: qty 1

## 2020-04-05 MED ORDER — PANTOPRAZOLE SODIUM 40 MG PO TBEC
40.0000 mg | DELAYED_RELEASE_TABLET | Freq: Once | ORAL | Status: AC
  Administered 2020-04-05: 13:00:00 40 mg via ORAL
  Filled 2020-04-05: qty 1

## 2020-04-05 MED ORDER — ONDANSETRON HCL 4 MG/2ML IJ SOLN
4.0000 mg | Freq: Once | INTRAMUSCULAR | Status: DC | PRN
  Filled 2020-04-05 (×2): qty 2

## 2020-04-05 NOTE — ED Notes (Signed)
PO fluids given as ordered.

## 2020-04-05 NOTE — ED Triage Notes (Addendum)
Pt complains of nausea, emesis, 9/10 abdominal pain x 1 week. No diarrhea. Pt had similar episode 1 year ago, says she had ulcer. Pt was trying ranitidine she had that was left over, which provided relief but ran out 2 days ago. Pt has not had menstrual period in 2 months, she has had negative pregnancy tests since.

## 2020-04-05 NOTE — ED Notes (Signed)
Informed MD. And RN about Istat HCG

## 2020-04-05 NOTE — ED Provider Notes (Signed)
[facility] DEPT Provider Note   CSN: [id number] Arrival date & time: 04/05/20  1023     History Chief Complaint  Patient presents with  . Abdominal Pain  . Emesis    Nina Horn is a 49 y.o. female.  HPI     49 year old female comes in a chief complaint of abdominal pain and vomiting. Patient is having epigastric abdominal pain for the last several days.  She is also having worsening nausea with vomiting.  Patient has been told that she had peptic ulcer disease couple of years ago, and her pain is similar to that episode.  Patient has been taking Pepcid again, and her symptoms have improved, but the vomiting has gotten more intense.  Patient has no history of abdominal surgery.  She denies any bloody stools, melena, bloody emesis.  Past Medical History:  Diagnosis Date  . Depression   . Migraines     Patient Active Problem List   Diagnosis Date Noted  . NSVD (normal spontaneous vaginal delivery) 01/02/2015  . Normal labor 01/01/2015  . [redacted] weeks gestation of pregnancy   . Maternal age 83+, multigravida, antepartum   . Ultrasound scan to check interval growth of fetus   . Elderly multigravida with antepartum condition or complication 32/35/5732  . Previous child with anomaly, antepartum 06/26/2014  . DEPRESSION 10/25/2009  . MIGRAINE HEADACHE 10/25/2009    Past Surgical History:  Procedure Laterality Date  . PILONIDAL CYST EXCISION    . WISDOM TOOTH EXTRACTION       OB History    Gravida  9   Para  4   Term  3   Preterm  1   AB  5   Living  1     SAB  2   TAB  3   Ectopic  0   Multiple  0   Live Births  1           Family History  Problem Relation Age of Onset  . Skeletal dysplasia Daughter        Thanatophoric dysplasia    Social History   Tobacco Use  . Smoking status: Current Every Day Smoker    Packs/day: 1.00    Types: Cigarettes  . Smokeless tobacco: Never Used  Substance Use Topics    . Alcohol use: No  . Drug use: Yes    Types: Marijuana    Home Medications Prior to Admission medications   Medication Sig Start Date End Date Taking? Authorizing Provider  Aspirin-Acetaminophen-Caffeine (GOODY HEADACHE PO) Take 1 packet by mouth as needed (headache/pain).   Yes [provider]  Ferrous Sulfate (IRON PO) Take 1 tablet by mouth as needed (day before donating plasma).   Yes [provider]  ibuprofen (ADVIL,MOTRIN) 600 MG tablet Take 1 tablet (600 mg total) by mouth every 6 (six) hours as needed for mild pain. Patient not taking: Reported on 10/13/2018 01/03/15   Shelly Bombard, MD  ondansetron (ZOFRAN) 4 MG tablet Take 1 tablet (4 mg total) by mouth every 8 (eight) hours as needed for nausea or vomiting. Patient not taking: Reported on 04/05/2020 07/27/18   Corrie Dandy, MD  oxyCODONE-acetaminophen (PERCOCET/ROXICET) 5-325 MG per tablet Take 2 tablets by mouth every 4 (four) hours as needed for moderate pain or severe pain (for pain scale equal to or greater than 7). Patient not taking: Reported on 10/13/2018 01/03/15   Shelly Bombard, MD  pantoprazole (PROTONIX) 40 MG tablet Take 1  tablet (40 mg total) by mouth daily. 04/05/20   Derwood Kaplan, MD  sucralfate (CARAFATE) 1 g tablet Take 1 tablet (1 g total) by mouth 4 (four) times daily -  with meals and at bedtime. 04/05/20   Derwood Kaplan, MD  ranitidine (ZANTAC) 150 MG tablet Take 1 tablet (150 mg total) by mouth 2 (two) times daily. Patient not taking: Reported on 10/13/2018 07/27/18 04/05/20  Rigoberto Noel, MD    Allergies    Hydrocodone and Promethazine hcl  Review of Systems   Review of Systems  Constitutional: Positive for activity change.  Respiratory: Negative for shortness of breath.   Cardiovascular: Negative for chest pain.  Gastrointestinal: Positive for abdominal pain, nausea and vomiting. Negative for blood in stool.  All other systems reviewed and are negative.   Physical  Exam Updated Vital Signs BP 121/87   Pulse 77   Temp 97.8 F (36.6 C) (Oral)   Resp 18   LMP 02/04/2020   SpO2 99%   Physical Exam Vitals and nursing note reviewed.  Constitutional:      Appearance: She is well-developed.  HENT:     Head: Normocephalic and atraumatic.  Cardiovascular:     Rate and Rhythm: Normal rate.     Comments: 2+ radial pulse bilaterally, 2+ dorsalis pedis bilaterally Pulmonary:     Effort: Pulmonary effort is normal.  Abdominal:     General: Bowel sounds are normal.     Tenderness: There is abdominal tenderness in the epigastric area. There is no guarding or rebound.  Musculoskeletal:     Cervical back: Normal range of motion and neck supple.  Skin:    General: Skin is warm and dry.  Neurological:     Mental Status: She is alert and oriented to person, place, and time.     ED Results / Procedures / Treatments   Labs (all labs ordered are listed, but only abnormal results are displayed) Labs Reviewed  COMPREHENSIVE METABOLIC PANEL - Abnormal; Notable for the following components:      Result Value   Glucose, Bld 105 (*)    All other components within normal limits  CBC - Abnormal; Notable for the following components:   WBC 11.0 (*)    All other components within normal limits  URINALYSIS, ROUTINE W REFLEX MICROSCOPIC - Abnormal; Notable for the following components:   Ketones, ur 5 (*)    All other components within normal limits  I-STAT BETA HCG BLOOD, ED ([facility], [facility], [facility] ONLY) - Abnormal; Notable for the following components:   I-stat hCG, quantitative 6.4 (*)    All other components within normal limits  LIPASE, BLOOD  TROPONIN I (HIGH SENSITIVITY)  TROPONIN I (HIGH SENSITIVITY)    EKG None  Radiology No results found.  Procedures Procedures (including critical care time)  Medications Ordered in ED Medications  ondansetron (ZOFRAN) injection 4 mg (has no administration in time range)  alum & mag hydroxide-simeth (MAALOX/MYLANTA)  200-200-20 MG/5ML suspension 30 mL (30 mLs Oral Given 04/05/20 1246)  ondansetron (ZOFRAN) injection 4 mg (4 mg Intravenous Given 04/05/20 1246)  fentaNYL (SUBLIMAZE) injection 50 mcg (50 mcg Intravenous Given 04/05/20 1246)  ondansetron (ZOFRAN-ODT) disintegrating tablet 4 mg (4 mg Oral Given 04/05/20 1308)  pantoprazole (PROTONIX) EC tablet 40 mg (40 mg Oral Given 04/05/20 1308)    ED Course  I have reviewed the triage vital signs and the nursing notes.  Pertinent labs & imaging results that were available during my care of the patient were  reviewed by me and considered in my medical decision making (see chart for details).    MDM Rules/Calculators/A&P                      DDx includes:  Pancreatitis Hepatobiliary pathology including cholecystitis Gastritis/PUD SBO ACS syndrome Aortic Dissection   Patient comes in w/ chief complaint of epigastric abdominal pain, nausea and vomiting. Patient is having epigastric abdominal tenderness without any rebound or guarding.  She does admit to smoking a pack a day.  On exam she has 2+ radial pulse, 2+ dorsalis pedis pulse and she has no focal neurologic symptoms or deficits.  Doubt aortic dissection.  Basic labs are reassuring.  Vital signs are stable and within normal limits.  It appears that patient is likely having peptic ulcer disease, a diagnosis she was given couple of years back, especially as patient is stating that her symptoms are similar to that event.  We will give her GI cocktail and reassess as well.  Reassessment: On reassessment patient states that her symptoms have improved dramatically after the GI cocktail.  She still having nausea.  Oral challenge has been initiated.  Labs are completely reassuring.  High-sensitivity troponin is at 2.  Repeat not required.  Once patient passes oral challenge we will work on discharging her.  She has been advised to follow-up with GI.   Final Clinical Impression(s) / ED Diagnoses Final diagnoses:    PUD (peptic ulcer disease)  Epigastric abdominal pain    Rx / DC Orders ED Discharge Orders         Ordered    pantoprazole (PROTONIX) 40 MG tablet  Daily     04/05/20 1424    sucralfate (CARAFATE) 1 g tablet  3 times daily with meals & bedtime     04/05/20 1424           Derwood Kaplan, MD 04/05/20 [phone]

## 2020-04-05 NOTE — Discharge Instructions (Signed)
We suspect that your symptoms are because of peptic ulcer disease. Please start taking the medications prescribed.  Call the GI team for a follow-up appointment.  Return to the ER immediately if you start having bloody stools, severe pain, fevers or chills.

## 2020-04-05 NOTE — ED Notes (Signed)
An After Visit Summary was printed and given to the patient. Discharge instructions given and no further questions at this time.  

## 2020-08-04 DIAGNOSIS — A5901 Trichomonal vulvovaginitis: Secondary | ICD-10-CM | POA: Diagnosis not present

## 2020-08-04 DIAGNOSIS — Z113 Encounter for screening for infections with a predominantly sexual mode of transmission: Secondary | ICD-10-CM | POA: Diagnosis not present

## 2020-08-04 DIAGNOSIS — Z114 Encounter for screening for human immunodeficiency virus [HIV]: Secondary | ICD-10-CM | POA: Diagnosis not present

## 2021-03-13 ENCOUNTER — Emergency Department (HOSPITAL_COMMUNITY)
Admission: EM | Admit: 2021-03-13 | Discharge: 2021-03-13 | Disposition: A | Discharge status: Home / Self Care | Payer: Medicaid Other | Attending: Emergency Medicine | Admitting: Emergency Medicine

## 2021-03-13 ENCOUNTER — Encounter (HOSPITAL_COMMUNITY): Payer: Self-pay

## 2021-03-13 ENCOUNTER — Emergency Department (HOSPITAL_COMMUNITY): Payer: Medicaid Other

## 2021-03-13 DIAGNOSIS — N3001 Acute cystitis with hematuria: Secondary | ICD-10-CM | POA: Insufficient documentation

## 2021-03-13 DIAGNOSIS — I700 Atherosclerosis of aorta: Secondary | ICD-10-CM | POA: Diagnosis not present

## 2021-03-13 DIAGNOSIS — F17210 Nicotine dependence, cigarettes, uncomplicated: Secondary | ICD-10-CM | POA: Insufficient documentation

## 2021-03-13 DIAGNOSIS — I878 Other specified disorders of veins: Secondary | ICD-10-CM | POA: Diagnosis not present

## 2021-03-13 DIAGNOSIS — R319 Hematuria, unspecified: Secondary | ICD-10-CM | POA: Diagnosis not present

## 2021-03-13 LAB — CBC WITH DIFFERENTIAL/PLATELET
Abs Immature Granulocytes: 0.05 10*3/uL (ref 0.00–0.07)
Basophils Absolute: 0 10*3/uL (ref 0.0–0.1)
Basophils Relative: 0 %
Eosinophils Absolute: 0 10*3/uL (ref 0.0–0.5)
Eosinophils Relative: 0 %
HCT: 43.1 % (ref 36.0–46.0)
Hemoglobin: 14.2 g/dL (ref 12.0–15.0)
Immature Granulocytes: 0 %
Lymphocytes Relative: 14 %
Lymphs Abs: 1.9 10*3/uL (ref 0.7–4.0)
MCH: 31.7 pg (ref 26.0–34.0)
MCHC: 32.9 g/dL (ref 30.0–36.0)
MCV: 96.2 fL (ref 80.0–100.0)
Monocytes Absolute: 0.9 10*3/uL (ref 0.1–1.0)
Monocytes Relative: 6 %
Neutro Abs: 10.8 10*3/uL — ABNORMAL HIGH (ref 1.7–7.7)
Neutrophils Relative %: 80 %
Platelets: 224 10*3/uL (ref 150–400)
RBC: 4.48 MIL/uL (ref 3.87–5.11)
RDW: 13.4 % (ref 11.5–15.5)
WBC: 13.7 10*3/uL — ABNORMAL HIGH (ref 4.0–10.5)
nRBC: 0 % (ref 0.0–0.2)

## 2021-03-13 LAB — URINALYSIS, ROUTINE W REFLEX MICROSCOPIC
Bilirubin Urine: NEGATIVE
Glucose, UA: NEGATIVE mg/dL
Ketones, ur: NEGATIVE mg/dL
Nitrite: NEGATIVE
Protein, ur: NEGATIVE mg/dL
Specific Gravity, Urine: 1.004 — ABNORMAL LOW (ref 1.005–1.030)
WBC, UA: >50 WBC/hpf — ABNORMAL HIGH (ref 0–5)
pH: 8 (ref 5.0–8.0)

## 2021-03-13 LAB — COMPREHENSIVE METABOLIC PANEL
ALT: 10 U/L (ref 0–44)
AST: 16 U/L (ref 15–41)
Albumin: 3.6 g/dL (ref 3.5–5.0)
Alkaline Phosphatase: 59 U/L (ref 38–126)
Anion gap: 6 (ref 5–15)
BUN: 7 mg/dL (ref 6–20)
CO2: 25 mmol/L (ref 22–32)
Calcium: 8.7 mg/dL — ABNORMAL LOW (ref 8.9–10.3)
Chloride: 110 mmol/L (ref 98–111)
Creatinine, Ser: 0.68 mg/dL (ref 0.44–1.00)
GFR, Estimated: >60 mL/min (ref >60)
Glucose, Bld: 100 mg/dL — ABNORMAL HIGH (ref 70–99)
Potassium: 3.8 mmol/L (ref 3.5–5.1)
Sodium: 141 mmol/L (ref 135–145)
Total Bilirubin: 0.2 mg/dL — ABNORMAL LOW (ref 0.3–1.2)
Total Protein: 6.1 g/dL — ABNORMAL LOW (ref 6.5–8.1)

## 2021-03-13 LAB — I-STAT BETA HCG BLOOD, ED (MC, WL, AP ONLY): I-stat hCG, quantitative: <5 m[IU]/mL (ref <5)

## 2021-03-13 LAB — LIPASE, BLOOD: Lipase: 34 U/L (ref 11–51)

## 2021-03-13 MED ORDER — HYDROCODONE-ACETAMINOPHEN 5-325 MG PO TABS
1.0000 | ORAL_TABLET | Freq: Once | ORAL | Status: AC
  Administered 2021-03-13: 1 via ORAL
  Filled 2021-03-13: qty 1

## 2021-03-13 MED ORDER — CEPHALEXIN 500 MG PO CAPS: 500.0000 mg | ORAL_CAPSULE | Freq: Two times a day (BID) | ORAL | 0 refills | Status: AC

## 2021-03-13 MED ORDER — SODIUM CHLORIDE 0.9 % IV SOLN: 1.0000 g | Freq: Once | INTRAVENOUS | Status: DC

## 2021-03-13 MED ORDER — STERILE WATER FOR INJECTION IJ SOLN
INTRAMUSCULAR | Status: AC
  Administered 2021-03-13: 2.1 mL
  Filled 2021-03-13: qty 10

## 2021-03-13 MED ORDER — PHENAZOPYRIDINE HCL 200 MG PO TABS: 200.0000 mg | ORAL_TABLET | Freq: Three times a day (TID) | ORAL | 0 refills | Status: DC

## 2021-03-13 MED ORDER — CEFTRIAXONE SODIUM 1 G IJ SOLR
1.0000 g | Freq: Once | INTRAMUSCULAR | Status: AC
  Administered 2021-03-13: 1 g via INTRAMUSCULAR
  Filled 2021-03-13: qty 10

## 2021-03-13 NOTE — Discharge Instructions (Signed)
It was our pleasure taking care of you here in the emergency department today  Your urine does show possible urinary tract infection.  You were given a shot of antibiotics here.  We have also sent you home on oral antibiotics.  We have given you medication called Pyridium to help with the pain.  You may also take Tylenol and ibuprofen.  Follow-up with primary care provider  Return for any worsening symptoms

## 2021-03-13 NOTE — ED Triage Notes (Signed)
Emergency Medicine Provider Triage Evaluation Note  Nina Horn , a 50 y.o. female  was evaluated in triage.  Pt complains of hematuria and lower quadrant abdominal pain for urination.  Patient reports that her symptoms began this morning.  Patient endorses increased urinary frequency and urgency.  Nausea, vomiting, fevers, chills.  LMP was 11/27/20.  Her menstrual periods have been irregular over the last year.  Patient has a 30-pack-year history.    Review of Systems  Positive: Hematuria, urinary frequency, urinary urgency, abdominal pain Negative: Dysuria, fevers, chills, vaginal bleeding, vaginal discharge  Physical Exam  BP 117/77 (BP Location: Right Arm)   Pulse 75   Temp 98.3 F (36.8 C) (Oral)   Resp 18   SpO2 100%  Gen:   Awake, no distress   HEENT:  Atraumatic  Resp:  Normal effort  Cardiac:  Normal rate  Abd:   Nondistended, nontender  MSK:   Moves extremities without difficulty  Neuro:  Speech clear   Medical Decision Making  Medically screening exam initiated at 12:38 PM.  Appropriate orders placed.  Nina Horn was informed that the remainder of the evaluation will be completed by another provider, this initial triage assessment does not replace that evaluation, and the importance of remaining in the ED until their evaluation is complete.  Clinical Impression   The patient appears stable so that the remainder of the work up may be completed by another provider.     Haskel Schroeder, [place] 03/13/21 1240

## 2021-03-13 NOTE — ED Provider Notes (Signed)
[facility]-EMERGENCY DEPT Provider Note   CSN: [id number] Arrival date & time: 03/13/21  1200     History Chief Complaint  Patient presents with  . Hematuria    Nina Horn is a 50 y.o. female with past medical history who presents for evaluation of hematuria.  Patient states she had suprapubic discomfort, and burning with urination that began this morning.  Admits to urinary frequency and urgency.  No pelvic pain, vaginal discharge.  No concerns for any STDs.  Denies any vaginal discharge, vaginal bleeding.  LMP in December, has abnormal menstrual cycles.  No fever, chills, nausea, vomiting, chest pain, shortness of breath, flank pain, weakness.  She is not on anticoagulation.  Denies additional aggravating or alleviating factors. Rates pain a 8/10.  History obtained from patient and past medical records.  No interpreter used    HPI     Past Medical History:  Diagnosis Date  . Depression   . Migraines     Patient Active Problem List   Diagnosis Date Noted  . NSVD (normal spontaneous vaginal delivery) 01/02/2015  . Normal labor 01/01/2015  . [redacted] weeks gestation of pregnancy   . Maternal age 50+, multigravida, antepartum   . Ultrasound scan to check interval growth of fetus   . Elderly multigravida with antepartum condition or complication 06/26/2014  . Previous child with anomaly, antepartum 06/26/2014  . DEPRESSION 10/25/2009  . MIGRAINE HEADACHE 10/25/2009    Past Surgical History:  Procedure Laterality Date  . PILONIDAL CYST EXCISION    . WISDOM TOOTH EXTRACTION       OB History    Gravida  9   Para  4   Term  3   Preterm  1   AB  5   Living  1     SAB  2   IAB  3   Ectopic  0   Multiple  0   Live Births  1           Family History  Problem Relation Age of Onset  . Skeletal dysplasia Daughter        Thanatophoric dysplasia    Social History   Tobacco Use  . Smoking status: Current Every Day Smoker     Packs/day: 1.00    Types: Cigarettes  . Smokeless tobacco: Never Used  Substance Use Topics  . Alcohol use: No  . Drug use: Yes    Types: Marijuana    Home Medications Prior to Admission medications   Medication Sig Start Date End Date Taking? Authorizing Provider  cephALEXin (KEFLEX) 500 MG capsule Take 1 capsule (500 mg total) by mouth 2 (two) times daily for 7 days. 03/13/21 03/20/21 Yes Usher Hedberg A, PA-C  Ferrous Sulfate (IRON PO) Take 1 tablet by mouth as needed (day before donating plasma).   Yes [provider]  pantoprazole (PROTONIX) 40 MG tablet Take 1 tablet (40 mg total) by mouth daily. 04/05/20  Yes Derwood KaplanNanavati, Ankit, MD  phenazopyridine (PYRIDIUM) 200 MG tablet Take 1 tablet (200 mg total) by mouth 3 (three) times daily. 03/13/21  Yes Monroe Qin A, PA-C  sucralfate (CARAFATE) 1 g tablet Take 1 tablet (1 g total) by mouth 4 (four) times daily -  with meals and at bedtime. 04/05/20  Yes Derwood KaplanNanavati, Ankit, MD  ranitidine (ZANTAC) 150 MG tablet Take 1 tablet (150 mg total) by mouth 2 (two) times daily. Patient not taking: Reported on 10/13/2018 07/27/18 04/05/20  Rigoberto Noelickens, Brett, MD  Allergies    Hydrocodone and Promethazine hcl  Review of Systems   Review of Systems  Constitutional: Negative.   HENT: Negative.   Respiratory: Negative.   Cardiovascular: Negative.   Gastrointestinal: Positive for abdominal pain (Suprapubic discomfort with urination). Negative for abdominal distention, blood in stool, constipation, diarrhea, nausea, rectal pain and vomiting.  Genitourinary: Positive for dysuria, frequency, hematuria and urgency. Negative for decreased urine volume, difficulty urinating, dyspareunia, enuresis, flank pain, genital sores, menstrual problem, pelvic pain, vaginal bleeding, vaginal discharge and vaginal pain.  Musculoskeletal: Negative.   Skin: Negative.   Neurological: Negative.   All other systems reviewed and are negative.   Physical Exam Updated  Vital Signs BP 97/61 (BP Location: Left Arm)   Pulse 72   Temp 98.3 F (36.8 C) (Oral)   Resp 16   SpO2 97%   Physical Exam Vitals and nursing note reviewed.  Constitutional:      General: She is not in acute distress.    Appearance: She is well-developed. She is not ill-appearing, toxic-appearing or diaphoretic.  HENT:     Head: Normocephalic and atraumatic.     Nose: Nose normal.     Mouth/Throat:     Mouth: Mucous membranes are moist.  Eyes:     Pupils: Pupils are equal, round, and reactive to light.  Cardiovascular:     Rate and Rhythm: Normal rate.     Pulses: Normal pulses.     Heart sounds: Normal heart sounds.  Pulmonary:     Effort: Pulmonary effort is normal. No respiratory distress.     Breath sounds: Normal breath sounds.     Comments: Clear to auscultation bilaterally.  Speaks in full sentences without difficulty Abdominal:     General: Bowel sounds are normal. There is no distension.     Palpations: Abdomen is soft.     Tenderness: There is abdominal tenderness in the suprapubic area. There is no right CVA tenderness, left CVA tenderness, guarding or rebound. Negative signs include Murphy's sign and McBurney's sign.     Hernia: No hernia is present.     Comments: Soft, mild suprapubic discomfort.  No focal right lower quadrant left lower quadrant pain.  Negative McBurney point.  Negative CVA tap  Musculoskeletal:        General: Normal range of motion.     Cervical back: Normal range of motion.     Comments: Moves all 4 extremities without difficulty  Skin:    General: Skin is warm and dry.     Capillary Refill: Capillary refill takes less than 2 seconds.     Comments: No edema, erythema or warmth.  No fluctuance or nourished  Neurological:     Mental Status: She is alert.     ED Results / Procedures / Treatments   Labs (all labs ordered are listed, but only abnormal results are displayed) Labs Reviewed  CBC WITH DIFFERENTIAL/PLATELET - Abnormal;  Notable for the following components:      Result Value   WBC 13.7 (*)    Neutro Abs 10.8 (*)    All other components within normal limits  COMPREHENSIVE METABOLIC PANEL - Abnormal; Notable for the following components:   Glucose, Bld 100 (*)    Calcium 8.7 (*)    Total Protein 6.1 (*)    Total Bilirubin 0.2 (*)    All other components within normal limits  URINALYSIS, ROUTINE W REFLEX MICROSCOPIC - Abnormal; Notable for the following components:   Specific Gravity, Urine 1.004 (*)  Hgb urine dipstick LARGE (*)    Leukocytes,Ua LARGE (*)    WBC, UA >50 (*)    Bacteria, UA RARE (*)    All other components within normal limits  LIPASE, BLOOD  I-STAT BETA HCG BLOOD, ED ([facility], [facility], [facility] ONLY)    EKG None  Radiology CT RENAL STONE STUDY  Result Date: 03/13/2021 CLINICAL DATA:  Hematuria. EXAM: CT ABDOMEN AND PELVIS WITHOUT CONTRAST TECHNIQUE: Multidetector CT imaging of the abdomen and pelvis was performed following the standard protocol without IV contrast. COMPARISON:  None. FINDINGS: Lower chest: No acute abnormality. Hepatobiliary: No focal liver abnormality is seen. No gallstones, gallbladder wall thickening, or biliary dilatation. Pancreas: Unremarkable. No pancreatic ductal dilatation or surrounding inflammatory changes. Spleen: Normal in size without focal abnormality. Adrenals/Urinary Tract: Adrenal glands are unremarkable. Kidneys are normal, without renal calculi, focal lesion, or hydronephrosis. Bladder is unremarkable. Phleboliths in the pelvis. 3 mm calcific density in the vicinity of the left trigone is favored to represent phlebolith rather than ureteral calculus. There is no upstream ureteral dilation or hydronephrosis. Stomach/Bowel: Stomach is within normal limits. No evidence of appendicitis. No evidence of bowel wall thickening, distention, or inflammatory changes. Vascular/Lymphatic: Aortic atherosclerosis. No enlarged abdominal or pelvic lymph nodes. Reproductive: Uterus  and bilateral adnexa are unremarkable. Other: No abdominal wall hernia or abnormality. No abdominopelvic ascites. Musculoskeletal: No acute or significant osseous findings. IMPRESSION: 1. No evidence of nephrolithiasis or obstructive uropathy. 2. 3 mm calcific density in the vicinity of the left trigone is favored to represent phlebolith rather than ureteral calculus. There is no upstream ureteral dilation or hydronephrosis. Aortic Atherosclerosis (ICD10-I70.0). Electronically Signed   By: Ted Mcalpine M.D.   On: 03/13/2021 14:42    Procedures Procedures   Medications Ordered in ED Medications  cefTRIAXone (ROCEPHIN) injection 1 g (1 g Intramuscular Given 03/13/21 1532)  HYDROcodone-acetaminophen (NORCO/VICODIN) 5-325 MG per tablet 1 tablet (1 tablet Oral Given 03/13/21 1532)  sterile water (preservative free) injection (2.1 mLs  Given 03/13/21 1533)    ED Course  I have reviewed the triage vital signs and the nursing notes.  Pertinent labs & imaging results that were available during my care of the patient were reviewed by me and considered in my medical decision making (see chart for details).  50 year old presents for evaluation of dysuria and hematuria.  She is afebrile, nonseptic, non-ill-appearing.  Lungs clear.  Abdomen is some mild tenderness to suprapubic region however negative CVA tap.  She has no pelvic pain, vaginal discharge.  She has no concerns for any STDs.  She is having normal bowel movements.  She is not anticoagulated.  No vaginal bleeding.  Work-up started from triage.  Labs and imaging personally reviewed and interpreted:  CBC with leukocytosis at 13.7 Lipase 34 Pregnancy test negative Metabolic panel without acute electrolyte, renal or liver abnormality UA positive for UTI,  CT Stone with 2 mm calcific density in left trigone favored to represent phlebolith rather than urinary calculus  Patient reassessed.  Was given Rocephin as well as pain control.  She is  tolerating p.o. intake.  Symptoms likely from UTI.  Will DC home with antibiotics.   Patient is nontoxic, nonseptic appearing, in no apparent distress.  Patient's pain and other symptoms adequately managed in emergency department.  Fluid bolus given.  Labs, imaging and vitals reviewed.  Patient does not meet the SIRS or Sepsis criteria.  On repeat exam patient does not have a surgical abdomin and there are no peritoneal signs.  No indication of appendicitis, bowel obstruction, bowel perforation, cholecystitis, diverticulitis, PID, TOA, torsion or ectopic pregnancy.    The patient has been appropriately medically screened and/or stabilized in the ED. I have low suspicion for any other emergent medical condition which would require further screening, evaluation or treatment in the ED or require inpatient management.  Patient is hemodynamically stable and in no acute distress.  Patient able to ambulate in department prior to ED.  Evaluation does not show acute pathology that would require ongoing or additional emergent interventions while in the emergency department or further inpatient treatment.  I have discussed the diagnosis with the patient and answered all questions.  Pain is been managed while in the emergency department and patient has no further complaints prior to discharge.  Patient is comfortable with plan discussed in room and is stable for discharge at this time.  I have discussed strict return precautions for returning to the emergency department.  Patient was encouraged to follow-up with PCP/specialist refer to at discharge.     MDM Rules/Calculators/A&P                           Final Clinical Impression(s) / ED Diagnoses Final diagnoses:  Acute cystitis with hematuria    Rx / DC Orders ED Discharge Orders         Ordered    cephALEXin (KEFLEX) 500 MG capsule  2 times daily        03/13/21 1554    phenazopyridine (PYRIDIUM) 200 MG tablet  3 times daily        03/13/21 1554            Gregory Barrick A, PA-C 03/13/21 1555    Derwood Kaplan, MD 03/15/21 1606

## 2021-03-14 ENCOUNTER — Telehealth: Payer: Self-pay | Admitting: *Deleted

## 2021-03-14 DIAGNOSIS — Z0000 Encounter for general adult medical examination without abnormal findings: Secondary | ICD-10-CM

## 2021-03-14 NOTE — Telephone Encounter (Signed)
Transition Care Management Follow-up Telephone Call  Date of discharge and from where: 03/13/2021 - [facility]  How have you been since you were released from the hospital? "I am okay"  Any questions or concerns? No  Items Reviewed:  Did the pt receive and understand the discharge instructions provided? Yes   Medications obtained and verified? Yes   Other? No   Any new allergies since your discharge? No   Dietary orders reviewed? No  Do you have support at home? Yes    Functional Questionnaire: (I = Independent and D = Dependent) ADLs: I  Bathing/Dressing- I  Meal Prep- I  Eating- I  Maintaining continence- I  Transferring/Ambulation- I  Managing Meds- I  Follow up appointments reviewed:   PCP Hospital f/u appt confirmed? No  - does not have PCP, referral placed  Specialist Hospital f/u appt confirmed? No    Are transportation arrangements needed? No   If their condition worsens, is the pt aware to call PCP or go to the Emergency Dept.? Yes  Was the patient provided with contact information for the PCP's office or ED? Yes  Was to pt encouraged to call back with questions or concerns? Yes

## 2022-04-04 DIAGNOSIS — Z113 Encounter for screening for infections with a predominantly sexual mode of transmission: Secondary | ICD-10-CM | POA: Diagnosis not present

## 2022-04-04 DIAGNOSIS — Z114 Encounter for screening for human immunodeficiency virus [HIV]: Secondary | ICD-10-CM | POA: Diagnosis not present

## 2022-04-04 DIAGNOSIS — B3731 Acute candidiasis of vulva and vagina: Secondary | ICD-10-CM | POA: Diagnosis not present

## 2022-07-31 NOTE — Progress Notes (Signed)
Erroneous encounter-disregard

## 2022-08-08 ENCOUNTER — Ambulatory Visit: Payer: Medicaid Other | Admitting: Family

## 2022-08-08 ENCOUNTER — Encounter: Payer: Medicaid Other | Admitting: Family

## 2022-08-08 DIAGNOSIS — Z7689 Persons encountering health services in other specified circumstances: Secondary | ICD-10-CM

## 2023-01-16 DIAGNOSIS — M79641 Pain in right hand: Secondary | ICD-10-CM | POA: Insufficient documentation

## 2023-02-02 ENCOUNTER — Other Ambulatory Visit: Payer: Self-pay | Admitting: Gastroenterology

## 2023-02-02 DIAGNOSIS — R109 Unspecified abdominal pain: Secondary | ICD-10-CM

## 2023-02-13 ENCOUNTER — Inpatient Hospital Stay: Admission: RE | Admit: 2023-02-13 | Payer: Medicaid Other | Source: Ambulatory Visit

## 2023-02-21 ENCOUNTER — Encounter: Payer: Self-pay | Admitting: *Deleted

## 2023-02-21 ENCOUNTER — Other Ambulatory Visit: Payer: Self-pay | Admitting: *Deleted

## 2023-02-21 DIAGNOSIS — C884 Extranodal marginal zone B-cell lymphoma of mucosa-associated lymphoid tissue [MALT-lymphoma]: Secondary | ICD-10-CM

## 2023-02-21 NOTE — Progress Notes (Signed)
Referral entered  

## 2023-02-21 NOTE — Progress Notes (Signed)
PATIENT NAVIGATOR PROGRESS NOTE  Name: Nina Horn Date: 02/21/2023 MRN: [id number]  DOB: 04/24/1971   Reason for visit:  New patient appt  Comments:  Called and spoke with patient regarding referral received from Dr Rockie Neighbours and that pathology lab has requested additional stains for diagnosing purposes. Scheduled her with Dr Benay Spice on 03/12/23 at 1:40. Reviewed directions to building and parking with her.     Time spent counseling/coordinating care:  45 minutes

## 2023-03-12 ENCOUNTER — Inpatient Hospital Stay: Payer: Medicaid Other | Attending: Oncology | Admitting: Oncology

## 2023-03-12 ENCOUNTER — Telehealth: Payer: Self-pay | Admitting: *Deleted

## 2023-03-12 NOTE — Telephone Encounter (Signed)
Unable to reach patient re: being late for her new patient consult today. Also not able to reach her significant other-no answer.

## 2023-03-13 ENCOUNTER — Telehealth: Payer: Self-pay | Admitting: *Deleted

## 2023-03-22 ENCOUNTER — Ambulatory Visit: Payer: Medicaid Other | Admitting: Oncology

## 2023-04-10 ENCOUNTER — Inpatient Hospital Stay: Payer: Medicaid Other | Attending: Oncology | Admitting: Oncology

## 2023-05-01 DIAGNOSIS — R2232 Localized swelling, mass and lump, left upper limb: Secondary | ICD-10-CM | POA: Insufficient documentation

## 2023-05-08 ENCOUNTER — Encounter: Payer: Self-pay | Admitting: *Deleted

## 2023-05-08 NOTE — Progress Notes (Signed)
Called placed to [facility] that referral is closed, patient did not keep New Patient appt x3. Spoke with Tabitha at [facility].

## 2023-05-22 ENCOUNTER — Inpatient Hospital Stay: Admission: RE | Admit: 2023-05-22 | Payer: Medicaid Other | Source: Ambulatory Visit

## 2023-05-25 ENCOUNTER — Ambulatory Visit
Admission: RE | Admit: 2023-05-25 | Discharge: 2023-05-25 | Disposition: A | Discharge status: Home / Self Care | Payer: Medicaid Other | Source: Ambulatory Visit | Attending: Gastroenterology | Admitting: Gastroenterology

## 2023-05-25 DIAGNOSIS — R109 Unspecified abdominal pain: Secondary | ICD-10-CM

## 2023-05-25 MED ORDER — IOPAMIDOL (ISOVUE-300) INJECTION 61%
100.0000 mL | Freq: Once | INTRAVENOUS | Status: AC | PRN
  Administered 2023-05-25: 100 mL via INTRAVENOUS

## 2023-05-29 ENCOUNTER — Telehealth: Payer: Self-pay | Admitting: Oncology

## 2023-05-30 ENCOUNTER — Telehealth: Payer: Medicaid Other | Admitting: Physician Assistant

## 2023-05-30 DIAGNOSIS — R110 Nausea: Secondary | ICD-10-CM

## 2023-05-30 DIAGNOSIS — C884 Extranodal marginal zone B-cell lymphoma of mucosa-associated lymphoid tissue [MALT-lymphoma]: Secondary | ICD-10-CM

## 2023-05-30 DIAGNOSIS — R109 Unspecified abdominal pain: Secondary | ICD-10-CM

## 2023-05-30 DIAGNOSIS — R6883 Chills (without fever): Secondary | ICD-10-CM

## 2023-05-31 ENCOUNTER — Emergency Department (HOSPITAL_BASED_OUTPATIENT_CLINIC_OR_DEPARTMENT_OTHER): Payer: Medicaid Other

## 2023-05-31 ENCOUNTER — Encounter (HOSPITAL_BASED_OUTPATIENT_CLINIC_OR_DEPARTMENT_OTHER): Payer: Self-pay | Admitting: Emergency Medicine

## 2023-05-31 ENCOUNTER — Other Ambulatory Visit: Payer: Self-pay

## 2023-05-31 ENCOUNTER — Inpatient Hospital Stay (HOSPITAL_BASED_OUTPATIENT_CLINIC_OR_DEPARTMENT_OTHER)
Admission: EM | Admit: 2023-05-31 | Discharge: 2023-06-02 | DRG: 842 | Disposition: A | Discharge status: Home / Self Care | Payer: Medicaid Other | Attending: Internal Medicine | Admitting: Internal Medicine

## 2023-05-31 DIAGNOSIS — E8809 Other disorders of plasma-protein metabolism, not elsewhere classified: Secondary | ICD-10-CM | POA: Diagnosis not present

## 2023-05-31 DIAGNOSIS — R1084 Generalized abdominal pain: Principal | ICD-10-CM

## 2023-05-31 DIAGNOSIS — D649 Anemia, unspecified: Secondary | ICD-10-CM | POA: Diagnosis present

## 2023-05-31 DIAGNOSIS — R54 Age-related physical debility: Secondary | ICD-10-CM | POA: Diagnosis present

## 2023-05-31 DIAGNOSIS — Z885 Allergy status to narcotic agent status: Secondary | ICD-10-CM

## 2023-05-31 DIAGNOSIS — C884 Extranodal marginal zone B-cell lymphoma of mucosa-associated lymphoid tissue [MALT-lymphoma]: Principal | ICD-10-CM | POA: Diagnosis present

## 2023-05-31 DIAGNOSIS — Z888 Allergy status to other drugs, medicaments and biological substances status: Secondary | ICD-10-CM

## 2023-05-31 DIAGNOSIS — D490 Neoplasm of unspecified behavior of digestive system: Secondary | ICD-10-CM

## 2023-05-31 DIAGNOSIS — R112 Nausea with vomiting, unspecified: Secondary | ICD-10-CM | POA: Diagnosis present

## 2023-05-31 DIAGNOSIS — F17210 Nicotine dependence, cigarettes, uncomplicated: Secondary | ICD-10-CM | POA: Diagnosis present

## 2023-05-31 DIAGNOSIS — Z79899 Other long term (current) drug therapy: Secondary | ICD-10-CM

## 2023-05-31 DIAGNOSIS — K5909 Other constipation: Secondary | ICD-10-CM | POA: Diagnosis present

## 2023-05-31 LAB — URINALYSIS, ROUTINE W REFLEX MICROSCOPIC
Bilirubin Urine: NEGATIVE
Glucose, UA: NEGATIVE mg/dL
Ketones, ur: 40 mg/dL — AB
Leukocytes,Ua: NEGATIVE
Nitrite: NEGATIVE
Protein, ur: NEGATIVE mg/dL
Specific Gravity, Urine: 1.011 (ref 1.005–1.030)
pH: 6.5 (ref 5.0–8.0)

## 2023-05-31 LAB — COMPREHENSIVE METABOLIC PANEL
ALT: 8 U/L (ref 0–44)
AST: 14 U/L — ABNORMAL LOW (ref 15–41)
Albumin: 4.8 g/dL (ref 3.5–5.0)
Alkaline Phosphatase: 89 U/L (ref 38–126)
Anion gap: 13 (ref 5–15)
BUN: 10 mg/dL (ref 6–20)
CO2: 27 mmol/L (ref 22–32)
Calcium: 10.7 mg/dL — ABNORMAL HIGH (ref 8.9–10.3)
Chloride: 99 mmol/L (ref 98–111)
Creatinine, Ser: 0.86 mg/dL (ref 0.44–1.00)
GFR, Estimated: >60 mL/min (ref >60)
Glucose, Bld: 97 mg/dL (ref 70–99)
Potassium: 4 mmol/L (ref 3.5–5.1)
Sodium: 139 mmol/L (ref 135–145)
Total Bilirubin: 0.4 mg/dL (ref 0.3–1.2)
Total Protein: 8.3 g/dL — ABNORMAL HIGH (ref 6.5–8.1)

## 2023-05-31 LAB — PREGNANCY, URINE: Preg Test, Ur: NEGATIVE

## 2023-05-31 LAB — CBC
HCT: 44.4 % (ref 36.0–46.0)
Hemoglobin: 15.3 g/dL — ABNORMAL HIGH (ref 12.0–15.0)
MCH: 30.6 pg (ref 26.0–34.0)
MCHC: 34.5 g/dL (ref 30.0–36.0)
MCV: 88.8 fL (ref 80.0–100.0)
Platelets: 342 10*3/uL (ref 150–400)
RBC: 5 MIL/uL (ref 3.87–5.11)
RDW: 13.6 % (ref 11.5–15.5)
WBC: 16.6 10*3/uL — ABNORMAL HIGH (ref 4.0–10.5)
nRBC: 0 % (ref 0.0–0.2)

## 2023-05-31 LAB — LIPASE, BLOOD: Lipase: 43 U/L (ref 11–51)

## 2023-05-31 MED ORDER — PANTOPRAZOLE SODIUM 40 MG IV SOLR
40.0000 mg | Freq: Once | INTRAVENOUS | Status: AC
  Administered 2023-05-31: 40 mg via INTRAVENOUS
  Filled 2023-05-31: qty 10

## 2023-05-31 MED ORDER — SODIUM CHLORIDE 0.9 % IV BOLUS
1000.0000 mL | Freq: Once | INTRAVENOUS | Status: AC
  Administered 2023-05-31: 1000 mL via INTRAVENOUS

## 2023-05-31 MED ORDER — ONDANSETRON HCL 4 MG/2ML IJ SOLN
4.0000 mg | Freq: Once | INTRAMUSCULAR | Status: AC
  Administered 2023-05-31: 4 mg via INTRAVENOUS
  Filled 2023-05-31: qty 2

## 2023-05-31 MED ORDER — MORPHINE SULFATE (PF) 4 MG/ML IV SOLN
4.0000 mg | Freq: Once | INTRAVENOUS | Status: AC
  Administered 2023-05-31: 4 mg via INTRAVENOUS
  Filled 2023-05-31: qty 1

## 2023-05-31 MED ORDER — IOHEXOL 300 MG/ML  SOLN
100.0000 mL | Freq: Once | INTRAMUSCULAR | Status: AC | PRN
  Administered 2023-05-31: 75 mL via INTRAVENOUS

## 2023-05-31 NOTE — ED Triage Notes (Signed)
Pt arrives pov,slow gait with c/o abd cramping, n/v, and generalized weakness x 4 days. Recent dx with "stomach cancer". Waiting to see oncology

## 2023-05-31 NOTE — ED Notes (Signed)
Ambulatory to restroom

## 2023-05-31 NOTE — Progress Notes (Signed)
Plan of Care Note for accepted transfer   Patient: Nina Horn MRN: [id number]   DOA: 05/31/2023  Facility requesting transfer: [facility]  Requesting Provider: Dr. 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   CT shows unchanged gastric wall thickening and lymph nodes suspicious for metastatic disease.   GI (Dr. Lorenso Quarry) was consulted by ED MD and patient was given IVF, morphine, and Zofran but continues to have N/V.   Plan of care: The patient is accepted for admission to Telemetry unit, at [facility].   Author: Briscoe Deutscher, MD 05/31/2023  Check [email or web address] for on-call coverage.  Nursing staff, Please call [facility] Admits & Consults System-Wide number on Amion as soon as patient's arrival, so appropriate admitting provider can evaluate the pt.

## 2023-05-31 NOTE — ED Notes (Signed)
Pain and nausea improvement. Cramping less frequest

## 2023-05-31 NOTE — ED Notes (Signed)
Patient transported to CT 

## 2023-05-31 NOTE — ED Provider Notes (Signed)
[place] EMERGENCY DEPARTMENT AT [facility] Provider Note   CSN: [id number] Arrival date & time: 05/31/23  1812     History  Chief Complaint  Patient presents with   Abdominal Pain    Nina Horn is a 52 y.o. female.  She tells me she was diagnosed with a MALT tumor in her stomach by Dr. Levora Angel GI.  She was having recurrent stomach pain and had an endoscopy which showed the tumor.  She had some outpatient CTs and is waiting for her first visit with oncology coming up.  For the last few days she has had worsening abdominal pain nausea and vomiting that has not been responsive to her oral medications.  She also has not had a bowel movement in a few days.  She does not think she has had a fever.  No hematemesis.  She also had a recent hand surgery by Dr. Ty Hilts for some cyst in her hand.  Has been using some Vicodin as needed but mostly Tylenol.  The history is provided by the patient.  Abdominal Pain Pain location:  Generalized Pain quality: aching   Pain severity:  Severe Onset quality:  Gradual Duration:  4 days Timing:  Constant Progression:  Waxing and waning Chronicity:  New Context: not trauma   Relieved by:  Nothing Worsened by:  Nothing Ineffective treatments:  OTC medications Associated symptoms: constipation, nausea and vomiting   Associated symptoms: no chest pain, no cough, no diarrhea, no dysuria, no fever, no hematemesis, no hematochezia, no hematuria and no shortness of breath        Home Medications Prior to Admission medications   Medication Sig Start Date End Date Taking? Authorizing Provider  Ferrous Sulfate (IRON PO) Take 1 tablet by mouth as needed (day before donating plasma).    [provider]  pantoprazole (PROTONIX) 40 MG tablet Take 1 tablet (40 mg total) by mouth daily. 04/05/20   Derwood Kaplan, MD  phenazopyridine (PYRIDIUM) 200 MG tablet Take 1 tablet (200 mg total) by mouth 3 (three) times daily. 03/13/21    Henderly, Britni A, PA-C  sucralfate (CARAFATE) 1 g tablet Take 1 tablet (1 g total) by mouth 4 (four) times daily -  with meals and at bedtime. 04/05/20   Derwood Kaplan, MD  ranitidine (ZANTAC) 150 MG tablet Take 1 tablet (150 mg total) by mouth 2 (two) times daily. Patient not taking: Reported on 10/13/2018 07/27/18 04/05/20  Rigoberto Noel, MD      Allergies    Hydrocodone and Promethazine hcl    Review of Systems   Review of Systems  Constitutional:  Negative for fever.  Respiratory:  Negative for cough and shortness of breath.   Cardiovascular:  Negative for chest pain.  Gastrointestinal:  Positive for abdominal pain, constipation, nausea and vomiting. Negative for diarrhea, hematemesis and hematochezia.  Genitourinary:  Negative for dysuria and hematuria.    Physical Exam Updated Vital Signs BP 121/88   Pulse 75   Temp 99.5 F (37.5 C) (Oral)   Resp 20   Wt 54 kg   SpO2 100%   BMI 21.08 kg/m  Physical Exam Vitals and nursing note reviewed.  Constitutional:      General: She is in acute distress.     Appearance: She is well-developed.  HENT:     Head: Normocephalic and atraumatic.  Eyes:     Conjunctiva/sclera: Conjunctivae normal.  Cardiovascular:     Rate and Rhythm: Normal rate and regular rhythm.  Heart sounds: No murmur heard. Pulmonary:     Effort: Pulmonary effort is normal. No respiratory distress.     Breath sounds: Normal breath sounds.  Abdominal:     Palpations: Abdomen is soft.     Tenderness: There is generalized abdominal tenderness. There is no guarding or rebound.  Musculoskeletal:     Cervical back: Neck supple.     Comments: Left hand in a splint  Skin:    General: Skin is warm and dry.     Capillary Refill: Capillary refill takes less than 2 seconds.  Neurological:     General: No focal deficit present.     Mental Status: She is alert.     Sensory: No sensory deficit.     Motor: No weakness.     ED Results / Procedures / Treatments    Labs (all labs ordered are listed, but only abnormal results are displayed) Labs Reviewed  COMPREHENSIVE METABOLIC PANEL - Abnormal; Notable for the following components:      Result Value   Calcium 10.7 (*)    Total Protein 8.3 (*)    AST 14 (*)    All other components within normal limits  CBC - Abnormal; Notable for the following components:   WBC 16.6 (*)    Hemoglobin 15.3 (*)    All other components within normal limits  URINALYSIS, ROUTINE W REFLEX MICROSCOPIC - Abnormal; Notable for the following components:   Hgb urine dipstick SMALL (*)    Ketones, ur 40 (*)    Bacteria, UA RARE (*)    All other components within normal limits  CBC - Abnormal; Notable for the following components:   WBC 13.1 (*)    All other components within normal limits  LIPASE, BLOOD  PREGNANCY, URINE  CREATININE, SERUM  HIV ANTIBODY (ROUTINE TESTING W REFLEX)  COMPREHENSIVE METABOLIC PANEL  MAGNESIUM  PHOSPHORUS    EKG EKG Interpretation Date/Time:  Thursday May 31 2023 18:58:27 EDT Ventricular Rate:  64 PR Interval:  151 QRS Duration:  84 QT Interval:  388 QTC Calculation: 401 R Axis:   86  Text Interpretation: Sinus rhythm Probable left atrial enlargement Probable left ventricular hypertrophy ST elev, probable normal early repol pattern No significant change since prior 11/19 Confirmed by Meridee Score [phone]) on 05/31/2023 7:01:04 PM  Radiology CT ABDOMEN PELVIS W CONTRAST  Result Date: 05/31/2023 CLINICAL DATA:  Acute abdominal pain EXAM: CT ABDOMEN AND PELVIS WITH CONTRAST TECHNIQUE: Multidetector CT imaging of the abdomen and pelvis was performed using the standard protocol following bolus administration of intravenous contrast. RADIATION DOSE REDUCTION: This exam was performed according to the departmental dose-optimization program which includes automated exposure control, adjustment of the mA and/or kV according to patient size and/or use of iterative reconstruction technique.  CONTRAST:  75mL OMNIPAQUE IOHEXOL 300 MG/ML  SOLN COMPARISON:  CT examination dated May 25, 2019 FINDINGS: Lower chest: No acute abnormality. Hepatobiliary: Focal fatty infiltration along the falciform ligament. No gallstones, gallbladder wall thickening or biliary dilatation. Pancreas: Unremarkable. No pancreatic ductal dilatation or surrounding inflammatory changes. Spleen: Normal in size without focal abnormality. Adrenals/Urinary Tract: Adrenal glands are unremarkable. Kidneys are normal, without renal calculi, focal lesion, or hydronephrosis. Bladder is unremarkable. Stomach/Bowel: Gastric wall thickening of the distal gastric body, unchanged. Small bowel loops are normal in caliber. Appendix not identified, however no inflammatory changes in the pericecal region. No evidence of bowel wall thickening, distention, or inflammatory changes. Retained contrast in the rectosigmoid region, likely secondary to prior CT  examination dated Vascular/Lymphatic: Aortic atherosclerosis. No enlarged abdominal or pelvic lymph nodes. Reproductive: Uterus and bilateral adnexa are unremarkable. Other: Gastrohepatic lymph nodes suggesting metastatic disease. No abdominopelvic ascites. Musculoskeletal: No acute or significant osseous findings. IMPRESSION: 1. Gastric wall thickening of the distal gastric body, unchanged, which may suggest patient's known gastric malignancy. 2. Gastrohepatic lymph nodes suggesting metastatic disease. 3. No evidence of bowel obstruction. No free air or fluid. 4. Aortic atherosclerosis. Aortic Atherosclerosis (ICD10-I70.0). Electronically Signed   By: Larose Hires D.O.   On: 05/31/2023 20:46    Procedures Procedures    Medications Ordered in ED Medications  sodium chloride 0.9 % bolus 1,000 mL (has no administration in time range)  ondansetron (ZOFRAN) injection 4 mg (has no administration in time range)  morphine (PF) 4 MG/ML injection 4 mg (has no administration in time range)   pantoprazole (PROTONIX) injection 40 mg (has no administration in time range)    ED Course/ Medical Decision Making/ A&P                             Medical Decision Making Amount and/or Complexity of Data Reviewed Labs: ordered. Radiology: ordered.  Risk Prescription drug management. Decision regarding hospitalization.   This patient complains of abdominal pain nausea vomiting x 4 days; this involves an extensive number of treatment Options and is a complaint that carries with it a high risk of complications and morbidity. The differential includes tumor, obstruction, constipation, gastroenteritis, diverticulitis, colitis, peptic ulcer disease  I ordered, reviewed and interpreted labs, which included CBC with elevated white count stable hemoglobin, chemistries and LFTs fairly unremarkable, urinalysis with dehydration no gross signs of infection, pregnancy test negative I ordered medication IV fluids and pain medicine, PPI and nausea medication and reviewed PMP when indicated. I ordered imaging studies which included CT abdomen and pelvis and I independently    visualized and interpreted imaging which showed gastric wall thickening and lymphadenopathy concerning for malignancy Additional history obtained from patient's family members Previous records obtained and reviewed in epic, do not see any GI notes I consulted [facility] hospitalist Dr. Antionette Char and discussed lab and imaging findings and discussed disposition.  Cardiac monitoring reviewed, normal sinus rhythm/sinus bradycardia Social determinants considered, ongoing tobacco use Critical Interventions: None  After the interventions stated above, I reevaluated the patient and found patient's pain to be improved but not fully controlled. Admission and further testing considered, due to her not clear follow-up plan with oncology and her unmanageable symptoms.  Feel she would benefit from admission to the hospital for further treatment and  getting oncology and possibly palliative on board.  Patient in agreement with plan for admission.         Final Clinical Impression(s) / ED Diagnoses Final diagnoses:  Generalized abdominal pain  Intractable nausea and vomiting  Gastric tumor    Rx / DC Orders ED Discharge Orders     None         Terrilee Files, MD 06/01/23 [phone]

## 2023-05-31 NOTE — ED Notes (Signed)
Given crackers and apple juice per patient request

## 2023-05-31 NOTE — ED Notes (Signed)
Pts RT hand in splint, endorses recent surgery x 1 .5 weeks

## 2023-05-31 NOTE — Progress Notes (Signed)
Based on what you shared with me, I feel your condition warrants further evaluation as soon as possible at an Emergency department.    NOTE: There will be NO CHARGE for this eVisit   If you are having a true medical emergency please call 911.      Emergency Department-[facility]  Get Driving Directions  [phone]  [address]  [place], [place] [address]  Open 24/7/365      [facility] Emergency Department at [facility]  Get Driving Directions  [address]  [place], [place] [address]  Open 24/7/365    Emergency Department- [facility]  Get Driving Directions  [phone]  [address]  [place], [place] [address]  Open 24/7/365      [facility]  Get Driving Directions  [phone]  [address]  [place], [place] [address]  Open 24/7/365    [place]  Emergency Department- [facility]  Get Driving Directions  [phone]  [address]  [place], [place] [address]  Open 24/7/365    [place]  Emergency Department- [facility] [place]  Get Driving Directions  [address]  [place], [place] [address]  Open 24/7/365    [place]  Emergency Department- [facility]  Get Driving Directions  [phone]  [address]  [place], [place] [address]  Open 24/7/365    I have spent 5 minutes in review of e-visit questionnaire, review and updating patient chart, medical decision making and response to patient.   Octavious Zidek M Griselda Bramblett, PA-C  

## 2023-06-01 ENCOUNTER — Encounter (HOSPITAL_COMMUNITY): Payer: Self-pay | Admitting: Internal Medicine

## 2023-06-01 DIAGNOSIS — E8809 Other disorders of plasma-protein metabolism, not elsewhere classified: Secondary | ICD-10-CM | POA: Diagnosis not present

## 2023-06-01 DIAGNOSIS — R112 Nausea with vomiting, unspecified: Secondary | ICD-10-CM | POA: Diagnosis not present

## 2023-06-01 DIAGNOSIS — Z79899 Other long term (current) drug therapy: Secondary | ICD-10-CM | POA: Diagnosis not present

## 2023-06-01 DIAGNOSIS — D649 Anemia, unspecified: Secondary | ICD-10-CM | POA: Diagnosis not present

## 2023-06-01 DIAGNOSIS — K5909 Other constipation: Secondary | ICD-10-CM | POA: Diagnosis not present

## 2023-06-01 DIAGNOSIS — R1084 Generalized abdominal pain: Secondary | ICD-10-CM | POA: Diagnosis present

## 2023-06-01 DIAGNOSIS — C884 Extranodal marginal zone B-cell lymphoma of mucosa-associated lymphoid tissue [MALT-lymphoma]: Secondary | ICD-10-CM

## 2023-06-01 DIAGNOSIS — Z885 Allergy status to narcotic agent status: Secondary | ICD-10-CM | POA: Diagnosis not present

## 2023-06-01 DIAGNOSIS — F17210 Nicotine dependence, cigarettes, uncomplicated: Secondary | ICD-10-CM | POA: Diagnosis not present

## 2023-06-01 DIAGNOSIS — Z888 Allergy status to other drugs, medicaments and biological substances status: Secondary | ICD-10-CM | POA: Diagnosis not present

## 2023-06-01 DIAGNOSIS — R54 Age-related physical debility: Secondary | ICD-10-CM | POA: Diagnosis not present

## 2023-06-01 LAB — PHOSPHORUS: Phosphorus: 3.8 mg/dL (ref 2.5–4.6)

## 2023-06-01 LAB — MAGNESIUM: Magnesium: 1.9 mg/dL (ref 1.7–2.4)

## 2023-06-01 LAB — DIFFERENTIAL
Abs Immature Granulocytes: 0.04 10*3/uL (ref 0.00–0.07)
Basophils Absolute: 0 10*3/uL (ref 0.0–0.1)
Basophils Relative: 0 %
Eosinophils Absolute: 0.1 10*3/uL (ref 0.0–0.5)
Eosinophils Relative: 1 %
Immature Granulocytes: 0 %
Lymphocytes Relative: 15 %
Lymphs Abs: 1.6 10*3/uL (ref 0.7–4.0)
Monocytes Absolute: 0.8 10*3/uL (ref 0.1–1.0)
Monocytes Relative: 8 %
Neutro Abs: 8.1 10*3/uL — ABNORMAL HIGH (ref 1.7–7.7)
Neutrophils Relative %: 76 %

## 2023-06-01 LAB — LACTATE DEHYDROGENASE: LDH: 123 U/L (ref 98–192)

## 2023-06-01 LAB — COMPREHENSIVE METABOLIC PANEL
ALT: 11 U/L (ref 0–44)
AST: 14 U/L — ABNORMAL LOW (ref 15–41)
Albumin: 3.5 g/dL (ref 3.5–5.0)
Alkaline Phosphatase: 73 U/L (ref 38–126)
Anion gap: 16 — ABNORMAL HIGH (ref 5–15)
BUN: 9 mg/dL (ref 6–20)
CO2: 25 mmol/L (ref 22–32)
Calcium: 9.4 mg/dL (ref 8.9–10.3)
Chloride: 99 mmol/L (ref 98–111)
Creatinine, Ser: 0.85 mg/dL (ref 0.44–1.00)
GFR, Estimated: >60 mL/min (ref >60)
Glucose, Bld: 97 mg/dL (ref 70–99)
Potassium: 3.7 mmol/L (ref 3.5–5.1)
Sodium: 140 mmol/L (ref 135–145)
Total Bilirubin: 0.6 mg/dL (ref 0.3–1.2)
Total Protein: 6.6 g/dL (ref 6.5–8.1)

## 2023-06-01 LAB — CBC
HCT: 37.9 % (ref 36.0–46.0)
Hemoglobin: 12.7 g/dL (ref 12.0–15.0)
MCH: 30.4 pg (ref 26.0–34.0)
MCHC: 33.5 g/dL (ref 30.0–36.0)
MCV: 90.7 fL (ref 80.0–100.0)
Platelets: 287 10*3/uL (ref 150–400)
RBC: 4.18 MIL/uL (ref 3.87–5.11)
RDW: 13.3 % (ref 11.5–15.5)
WBC: 13.1 10*3/uL — ABNORMAL HIGH (ref 4.0–10.5)
nRBC: 0 % (ref 0.0–0.2)

## 2023-06-01 LAB — CREATININE, SERUM
Creatinine, Ser: 0.86 mg/dL (ref 0.44–1.00)
GFR, Estimated: >60 mL/min (ref >60)

## 2023-06-01 LAB — HIV ANTIBODY (ROUTINE TESTING W REFLEX): HIV Screen 4th Generation wRfx: NONREACTIVE

## 2023-06-01 MED ORDER — MELATONIN 5 MG PO TABS: 5.0000 mg | ORAL_TABLET | Freq: Every evening | ORAL | Status: DC | PRN

## 2023-06-01 MED ORDER — HYDROMORPHONE HCL 1 MG/ML IJ SOLN
0.5000 mg | INTRAMUSCULAR | Status: AC | PRN
  Administered 2023-06-01: 0.5 mg via INTRAVENOUS
  Filled 2023-06-01: qty 0.5

## 2023-06-01 MED ORDER — HYDROMORPHONE HCL 1 MG/ML IJ SOLN: 0.5000 mg | INTRAMUSCULAR | Status: DC | PRN

## 2023-06-01 MED ORDER — SODIUM CHLORIDE 0.9 % IV SOLN: INTRAVENOUS | Status: DC

## 2023-06-01 MED ORDER — ACETAMINOPHEN 325 MG PO TABS
650.0000 mg | ORAL_TABLET | Freq: Four times a day (QID) | ORAL | Status: DC | PRN
  Administered 2023-06-02: 650 mg via ORAL
  Filled 2023-06-01: qty 2

## 2023-06-01 MED ORDER — SCOPOLAMINE 1 MG/3DAYS TD PT72
1.0000 | MEDICATED_PATCH | TRANSDERMAL | Status: DC
  Administered 2023-06-01: 1.5 mg via TRANSDERMAL
  Filled 2023-06-01: qty 1

## 2023-06-01 MED ORDER — ENOXAPARIN SODIUM 30 MG/0.3ML IJ SOSY
30.0000 mg | PREFILLED_SYRINGE | INTRAMUSCULAR | Status: DC
  Administered 2023-06-01 – 2023-06-02 (×2): 30 mg via SUBCUTANEOUS
  Filled 2023-06-01 (×2): qty 0.3

## 2023-06-01 MED ORDER — ONDANSETRON HCL 4 MG/2ML IJ SOLN
4.0000 mg | Freq: Four times a day (QID) | INTRAMUSCULAR | Status: DC | PRN
  Administered 2023-06-01 – 2023-06-02 (×2): 4 mg via INTRAVENOUS
  Filled 2023-06-01 (×2): qty 2

## 2023-06-01 MED ORDER — SENNOSIDES-DOCUSATE SODIUM 8.6-50 MG PO TABS
2.0000 | ORAL_TABLET | Freq: Once | ORAL | Status: AC
  Administered 2023-06-01: 2 via ORAL
  Filled 2023-06-01: qty 2

## 2023-06-01 MED ORDER — OXYCODONE HCL 5 MG PO TABS
5.0000 mg | ORAL_TABLET | Freq: Four times a day (QID) | ORAL | Status: DC | PRN
  Administered 2023-06-02: 5 mg via ORAL
  Filled 2023-06-01: qty 1

## 2023-06-01 MED ORDER — POLYETHYLENE GLYCOL 3350 17 G PO PACK: 17.0000 g | PACK | Freq: Every day | ORAL | Status: DC | PRN

## 2023-06-01 MED ORDER — SENNOSIDES-DOCUSATE SODIUM 8.6-50 MG PO TABS
1.0000 | ORAL_TABLET | Freq: Every day | ORAL | Status: DC
  Filled 2023-06-01: qty 1

## 2023-06-01 MED ORDER — PROCHLORPERAZINE EDISYLATE 10 MG/2ML IJ SOLN
5.0000 mg | Freq: Four times a day (QID) | INTRAMUSCULAR | Status: DC | PRN
  Administered 2023-06-01: 5 mg via INTRAVENOUS
  Filled 2023-06-01: qty 2

## 2023-06-01 NOTE — H&P (Addendum)
History and Physical  Nina Horn ZOX:[id number] DOB: 05/29/1971 DOA: 05/31/2023  Referring physician: Accepted by Dr. Sherrell Puller, hospitalist service. PCP: Patient, No Pcp Per  Outpatient Specialists: GI, medical oncology. Patient coming from: Home through [facility] ED.  Chief Complaint: Abdominal cramps, intractable nausea and vomiting x 4 days  HPI: Nina Horn is a 52 y.o. female with medical history significant for tobacco use disorder, newly diagnosed gastric malignancy, scheduled for her first appointment with medical oncology on Monday, 06/04/2023.  Follows with Dr. Madelyn Brunner GI outpatient.  She presented initially to [facility] ED with complaints of intractable nausea vomiting x 4 days.  Associated with abdominal cramping intermittently lasting a few minutes and resolving.  Also endorses having left hand surgery about a week ago and has been using prescribed pain medication which has caused her to be constipated.  No bowel movement in the past 4 days.  In the ED, CT abdomen pelvis with contrast revealed the following findings: 1. Gastric wall thickening of the distal gastric body, unchanged, which may suggest patient's known gastric malignancy. 2. Gastrohepatic lymph nodes suggesting metastatic disease. 3. No evidence of bowel obstruction. No free air or fluid. 4. Aortic atherosclerosis. Aortic Atherosclerosis (ICD10-I70.0).  The patient is concerned about the plan regarding her gastric malignancy and her prognosis.  Medical oncology and [facility] consulted to assist with the management.  The patient was admitted by [facility], hospitalist service, to [facility] telemetry unit as observation status.  Due to no beds at [facility] long, the patient was transferred to [facility] telemetry surgical unit as observation status.  ED Course: Tmax 98.3.  BP 135/70, pulse 56, respiratory 18, saturation 100% on room air.  Lab studies remarkable for WBC 13.1.  Review  of Systems: Review of systems as noted in the HPI. All other systems reviewed and are negative.   Past Medical History:  Diagnosis Date   Depression    Migraines    Past Surgical History:  Procedure Laterality Date   PILONIDAL CYST EXCISION     WISDOM TOOTH EXTRACTION      Social History:  reports that she has been smoking cigarettes. She has been smoking an average of 1 pack per day. She has never used smokeless tobacco. She reports current drug use. Drug: Marijuana. She reports that she does not drink alcohol.   Allergies  Allergen Reactions   Hydrocodone Other (See Comments)    Delirium. Pt states tolerated Percocet on previous admission   Promethazine Hcl Other (See Comments)    delirium    Family History  Problem Relation Age of Onset   Skeletal dysplasia Daughter        Thanatophoric dysplasia      Prior to Admission medications   Medication Sig Start Date End Date Taking? Authorizing Provider  Ferrous Sulfate (IRON PO) Take 1 tablet by mouth as needed (day before donating plasma).    [provider]  pantoprazole (PROTONIX) 40 MG tablet Take 1 tablet (40 mg total) by mouth daily. 04/05/20   Derwood Kaplan, MD  phenazopyridine (PYRIDIUM) 200 MG tablet Take 1 tablet (200 mg total) by mouth 3 (three) times daily. 03/13/21   Henderly, Britni A, PA-C  sucralfate (CARAFATE) 1 g tablet Take 1 tablet (1 g total) by mouth 4 (four) times daily -  with meals and at bedtime. 04/05/20   Derwood Kaplan, MD  ranitidine (ZANTAC) 150 MG tablet Take 1 tablet (150 mg total) by mouth 2 (two) times daily.  Patient not taking: Reported on 10/13/2018 07/27/18 04/05/20  Rigoberto Noel, MD    Physical Exam: BP 135/70 (BP Location: Right Arm)   Pulse (!) 56   Temp 98.3 F (36.8 C) (Oral)   Resp 18   Wt 54 kg   SpO2 100%   BMI 21.08 kg/m   General: 52 y.o. year-old female Frail appearing in no acute distress.  Alert and oriented x3. Cardiovascular: Regular rate and rhythm with no  rubs or gallops.  No thyromegaly or JVD noted.  No lower extremity edema. 2/4 pulses in all 4 extremities. Respiratory: Clear to auscultation with no wheezes or rales. Good inspiratory effort. Abdomen: Soft diffusely tender nondistended with normal bowel sounds x4 quadrants. Muskuloskeletal: No cyanosis, clubbing or edema noted bilaterally Neuro: CN II-XII intact, strength, sensation, reflexes Skin: No ulcerative lesions noted or rashes Psychiatry: Judgement and insight appear normal. Mood is appropriate for condition and setting          Labs on Admission:  Basic Metabolic Panel: Recent Labs  Lab 05/31/23 1847  NA 139  K 4.0  CL 99  CO2 27  GLUCOSE 97  BUN 10  CREATININE 0.86  CALCIUM 10.7*   Liver Function Tests: Recent Labs  Lab 05/31/23 1847  AST 14*  ALT 8  ALKPHOS 89  BILITOT 0.4  PROT 8.3*  ALBUMIN 4.8   Recent Labs  Lab 05/31/23 1847  LIPASE 43   No results for input(s): "AMMONIA" in the last 168 hours. CBC: Recent Labs  Lab 05/31/23 1847  WBC 16.6*  HGB 15.3*  HCT 44.4  MCV 88.8  PLT 342   Cardiac Enzymes: No results for input(s): "CKTOTAL", "CKMB", "CKMBINDEX", "TROPONINI" in the last 168 hours.  BNP (last 3 results) No results for input(s): "BNP" in the last 8760 hours.  ProBNP (last 3 results) No results for input(s): "PROBNP" in the last 8760 hours.  CBG: No results for input(s): "GLUCAP" in the last 168 hours.  Radiological Exams on Admission: CT ABDOMEN PELVIS W CONTRAST  Result Date: 05/31/2023 CLINICAL DATA:  Acute abdominal pain EXAM: CT ABDOMEN AND PELVIS WITH CONTRAST TECHNIQUE: Multidetector CT imaging of the abdomen and pelvis was performed using the standard protocol following bolus administration of intravenous contrast. RADIATION DOSE REDUCTION: This exam was performed according to the departmental dose-optimization program which includes automated exposure control, adjustment of the mA and/or kV according to patient size  and/or use of iterative reconstruction technique. CONTRAST:  75mL OMNIPAQUE IOHEXOL 300 MG/ML  SOLN COMPARISON:  CT examination dated May 25, 2019 FINDINGS: Lower chest: No acute abnormality. Hepatobiliary: Focal fatty infiltration along the falciform ligament. No gallstones, gallbladder wall thickening or biliary dilatation. Pancreas: Unremarkable. No pancreatic ductal dilatation or surrounding inflammatory changes. Spleen: Normal in size without focal abnormality. Adrenals/Urinary Tract: Adrenal glands are unremarkable. Kidneys are normal, without renal calculi, focal lesion, or hydronephrosis. Bladder is unremarkable. Stomach/Bowel: Gastric wall thickening of the distal gastric body, unchanged. Small bowel loops are normal in caliber. Appendix not identified, however no inflammatory changes in the pericecal region. No evidence of bowel wall thickening, distention, or inflammatory changes. Retained contrast in the rectosigmoid region, likely secondary to prior CT examination dated Vascular/Lymphatic: Aortic atherosclerosis. No enlarged abdominal or pelvic lymph nodes. Reproductive: Uterus and bilateral adnexa are unremarkable. Other: Gastrohepatic lymph nodes suggesting metastatic disease. No abdominopelvic ascites. Musculoskeletal: No acute or significant osseous findings. IMPRESSION: 1. Gastric wall thickening of the distal gastric body, unchanged, which may suggest patient's known gastric malignancy. 2. Gastrohepatic  lymph nodes suggesting metastatic disease. 3. No evidence of bowel obstruction. No free air or fluid. 4. Aortic atherosclerosis. Aortic Atherosclerosis (ICD10-I70.0). Electronically Signed   By: Larose Hires D.O.   On: 05/31/2023 20:46    EKG: I independently viewed the EKG done and my findings are as followed: Sinus rhythm rate of 64.  Nonspecific ST-T changes.  QTc 401.  Assessment/Plan Present on Admission:  Intractable nausea and vomiting  Principal Problem:   Intractable nausea and  vomiting  Intractable nausea and vomiting x 4 days in the setting of newly diagnosed gastric malignancy IV antiemetics as needed IV fluid hydration NS at 50 cc/h x 2 days  Recently diagnosed gastric malignancy The patient endorses she had an EGD done by GI outpatient with findings suggestive of gastric malignancy, couldn't find records. Abnormal gastric findings also seen on abdominal CT scan done on 05/31/2023 at [facility] ED Medical oncology Dr. Truett Perna and [facility] Sprang GI Dr. Lorenso Quarry consulted to assist with the management  Leukocytosis Suspect reactive in the setting of presumed gastric Malignancy WBC on presentation 13.1 K Afebrile and non septic appearing Monitor WBCs  Tobacco use disorder Recommend complete cessation of tobacco use  Chronic constipation Start bowel regimen  Recent left hand surgery Per the patient, her left hand was operated at [facility] As needed pain management and bowel regimen in place   Time: 75 minutes.   DVT prophylaxis: Subcu Lovenox daily  Code Status: Full code  Family Communication: None at bedside.  Disposition Plan: Admitted to telemetry unit at [facility], due to no beds was transferred to [facility].  Consults called: Medical oncology, Dr. Truett Perna, [facility] GI Dr. Lorenso Quarry.  Admission status: Observation status   Status is: Observation    Darlin Drop MD [facility] Pager [phone]  If 7PM-7AM, please contact night-coverage [email or web address] Password [email or web address]  06/01/2023, 3:12 AM

## 2023-06-01 NOTE — Hospital Course (Addendum)
The patient is a 52 year old thin African-American female with past medical history significant for but not limited to tobacco abuse disorder, newly diagnosed gastric malignancy as well as other comorbidities who was scheduled for her first medical oncology appointment on Monday, 06/04/2023.  She follows with Dr. Levora Angel of [facility] initially [facility] with complaint of intractable nausea and vomiting for 4 days.  She is noted to have associated abdominal cramping intermittently that lasted a few minutes and then started resolving.  She also endorsed left hand surgery about a week ago and has been prescribed pain medication which caused her to become constipated with no bowel movement 4 days.  Further workup was done in the ED she had a CT scan of the abdomen pelvis which revealed gastric wall thickening of the distal gastric body which was unchanged as well as gastrohepatic lymph nodes suggesting metastatic disease and no evidence of bowel obstruction.  Left medical oncology was consulted as well as [facility] and the patient was admitted to [facility] hospitalist service and she was accepted at [facility] long however due to no bed 30 she was transferred to [facility] for further evaluation.  Patient was seen and examined and admitted early this morning after midnight by Dr. Dow Adolph and I am in current agreement with her assessment and plan with additional changes to plan of care being made accordingly.  Assessment and Plan:  Intractable nausea and vomiting x 4 days in the setting of newly diagnosed gastric malignancy -IV antiemetics as needed and will continue IV Zofran and she has 5 mg of promethazine; will start scopolamine patch -Continue IV fluid hydration NS at 50 cc/h x 2 days -Will attempt to clear liquid diet -C/w Supportive Care   Recently diagnosed gastric malignancy (MALT Lymphoma) -The patient endorses she had an EGD done by GI outpatient with findings suggestive of gastric  malignancy, couldn't find records. -Abnormal gastric findings also seen on abdominal CT scan done on 05/31/2023 at [facility] ED -Medical oncology Dr. Truett Perna and [facility] Dr. Lorenso Quarry consulted to assist with the management -Medical oncology recommending LDH, serum immunofixation and serum free light chains analysis as well as outpatient PET scan -Medical oncology recommend supportive care with antiemetics, IV fluid hydration as well as diet as tolerated and Dr. Leeanne Deed will check on her on Monday   Leukocytosis -Suspect reactive in the setting of presumed gastric Malignancy -WBC on presentation 13.1 K -WBC Trend: Recent Labs  Lab 05/31/23 1847 06/01/23 0350 06/02/23 0207  WBC 16.6* 13.1* 9.3  -Afebrile and non septic appearing -Continue to Monitor WBCs  Normocytic Anemia -Likely Hemoconcentrated on Admission due to Nausea and Vomiting and now likely dilutional drop -Hgb/Hct Trend: Recent Labs  Lab 05/31/23 1847 06/01/23 0350 06/02/23 0207  HGB 15.3* 12.7 11.9*  HCT 44.4 37.9 35.7*  MCV 88.8 90.7 91.5  -Check Anemia Panel in the outpatient setting   Tobacco Use disorder -Recommend complete cessation of tobacco use   Chronic Constipation -Start bowel regimen   Recent left hand surgery -Per the patient, her left hand was operated at [facility] -As needed pain management and bowel regimen in place -Order Sling for the Patient and follow up care per her Hand Surgeon   Hypoalbuminemia -Patient's Albumin Trend: Recent Labs  Lab 05/31/23 1847 06/01/23 0347 06/02/23 0207  ALBUMIN 4.8 3.5 3.3*  -Continue to Monitor and Trend and repeat CMP in the AM

## 2023-06-01 NOTE — TOC Initial Note (Signed)
Transition of Care [facility]) - Initial/Assessment Note    Patient Details  Name: Nina Horn MRN: [id number] Date of Birth: [birth year]  [facility] CM/SW Contact:    Kingsley Plan, RN Phone Number: 06/01/2023, 10:38 AM  Clinical Narrative:                  Spoke to patient at bedside. Updated face sheet information and contact information.   Patient from home with significant other Laural Roes [phone] and two children ages 43 and 15 years old.   PCP Norva Riffle   Patient had surgery on her left hand last week, reports she is suppose to have arm in sling and she forgot sling at home. NCM messaged MD and nurse regarding sling.   TOC will follow for discharge needs.  Expected Discharge Plan: Home/Self Care Barriers to Discharge: Continued Medical Work up   Patient Goals and CMS Choice Patient states their goals for this hospitalization and ongoing recovery are:: to return to home          Expected Discharge Plan and Services   Discharge Planning Services: CM Consult   Living arrangements for the past 2 months: Apartment                 DME Arranged: N/A         HH Arranged: NA          Prior Living Arrangements/Services Living arrangements for the past 2 months: Apartment Lives with:: Significant Other Patient language and need for interpreter reviewed:: Yes Do you feel safe going back to the place where you live?: Yes      Need for Family Participation in Patient Care: Yes (Comment) Care giver support system in place?: Yes (comment)   Criminal Activity/Legal Involvement Pertinent to Current Situation/Hospitalization: No - Comment as needed  Activities of Daily Living Home Assistive Devices/Equipment: None ADL Screening (condition at time of admission) Patient's cognitive ability adequate to safely complete daily activities?: Yes Is the patient deaf or have difficulty hearing?: No Does the patient have difficulty seeing,  even when wearing glasses/contacts?: No Does the patient have difficulty concentrating, remembering, or making decisions?: No Patient able to express need for assistance with ADLs?: Yes Does the patient have difficulty dressing or bathing?: No Independently performs ADLs?: No Communication: Independent Dressing (OT): Needs assistance (Patient has limited movement on leff hand due to recent surgery. left hand a splint.) Is this a change from baseline?: Pre-admission baseline Grooming: Needs assistance Is this a change from baseline?: Pre-admission baseline Feeding: Independent Bathing: Needs assistance Is this a change from baseline?: Pre-admission baseline Toileting: Needs assistance Is this a change from baseline?: Pre-admission baseline In/Out Bed: Independent Walks in Home: Independent Does the patient have difficulty walking or climbing stairs?: No Weakness of Legs: None Weakness of Arms/Hands: None  Permission Sought/Granted   Permission granted to share information with : No              Emotional Assessment Appearance:: Appears stated age Attitude/Demeanor/Rapport: Engaged Affect (typically observed): Accepting Orientation: : Oriented to Self, Oriented to Place, Oriented to  Time, Oriented to Situation Alcohol / Substance Use: Not Applicable Psych Involvement: No (comment)  Admission diagnosis:  Intractable nausea and vomiting [R11.2] Patient Active Problem List   Diagnosis Date Noted   Intractable nausea and vomiting 05/31/2023   NSVD (normal spontaneous vaginal delivery) 01/02/2015   Normal labor 01/01/2015   [redacted] weeks gestation of pregnancy  Maternal age 62+, multigravida, antepartum    Ultrasound scan to check interval growth of fetus    Elderly multigravida with antepartum condition or complication 06/26/2014   Previous child with anomaly, antepartum 06/26/2014   DEPRESSION 10/25/2009   MIGRAINE HEADACHE 10/25/2009   PCP:  Norm Salt,  PA Pharmacy:   [facility] [address] [place] - [address] [place] [address] Phone: [phone] Fax: [phone]     Social Determinants of Health (SDOH) Social History: SDOH Screenings   Tobacco Use: High Risk (05/31/2023)   SDOH Interventions:     Readmission Risk Interventions     No data to display

## 2023-06-01 NOTE — Consult Note (Addendum)
New Hematology/Oncology Consult   Requesting ZO:XWRUE [facility]       Reason for Consult: Non-Hodgkin's lymphoma  HPI: Nina Horn developed upper abdominal pain and nausea/vomiting several months ago.  The symptoms are similar to when she had peptic ulcer disease years ago.  Her symptoms did not improve with Prilosec and Zofran. She was referred to Dr. Cleophus Molt on 02/02/2023.  She was taken to an upper endoscopy 02/05/2023.  Nonbleeding ulcers were found at the incisura and in the gastric antrum.  The largest lesion measured 10 mm a biopsy was obtained.  Congestion, nodularity, ulceration, and inflammation were found in the gastric antrum.  A biopsy was obtained.  Moderate nodularity and congestion were found in the gastric body and lesser curvature.  The gastric cardia and fundus appeared normal.  The duodenum appeared normal.  The pathology from the gastric antrum biopsy revealed atypical lymphoproliferative process consistent with MALT lymphoma.  Stains were negative for H. pylori.  A cyclin D1 stain was negative.  B lymphocytes are positive for CD20.Marland Kitchen  A "esophagus "biopsy also returned with an atypical lymphoproliferative process consistent with MALT lymphoma.  A B-cell gene rearrangement study from the "esophagus "returned positive.  A CT of the abdomen and pelvis on 05/25/2023 the spleen normal.  Mild asymmetric wall thickening was seen at the lesser curvature of the stomach the distal gastric body.  Mild lymphadenopathy was seen in the gastropathic ligament with the largest node measuring 18 mm.  Several soft tissue nodules were seen in the gastrocolic ligament and the anterior mid and right abdomen with the largest measuring 10 mm.  She was referred for outpatient oncology evaluation, but canceled the appointment.  She was taken to a repeat upper endoscopy on 04/09/2023.  There was no evidence of ulcers in the stomach.  Nodularity was found in the distal gastric body, prepyloric region, and  gastric antrum.  Biopsies were obtained.  The cardia, fundus, and duodenum appeared normal.  The pathology from a biopsy of the prepyloric region revealed an atypical lymphoid infiltrate consistent with a lymphoid neoplasm.  Negative for H. pylori.  Biopsies from the antrum and stomach body also revealed an atypical lymphoid infiltrate.  She reports worsening of nausea and vomiting for the past several days.  She is tolerating some liquids.  She also has cramping abdominal pain.  She presented to the emergency room yesterday.  She underwent surgery for removal of a left hand ganglion cyst and epidermal cyst on 05/22/2023.  The left hand remains bandaged.  She has scheduled follow-up with orthopedics.   Past Medical History:  Diagnosis Date   Depression    Migraines   :   Past Surgical History:  Procedure Laterality Date   PILONIDAL CYST EXCISION     WISDOM TOOTH EXTRACTION    : .  Left index finger ganglion excision, left long finger pulley release, left ulnar palmar hand mass excision 05/22/2023-pathology revealed a ganglion cyst at the left index finger and benign epidermal inclusion cyst with surrounding inflammation at the left palmar hand mass   Current Facility-Administered Medications:    0.9 %  sodium chloride infusion, , Intravenous, Continuous, Darlin Drop, DO, Last Rate: 50 mL/hr at 06/01/23 0555, New Bag at 06/01/23 0555   acetaminophen (TYLENOL) tablet 650 mg, 650 mg, Oral, Q6H PRN, Margo Aye, Carole N, DO   enoxaparin (LOVENOX) injection 30 mg, 30 mg, Subcutaneous, Q24H, Hall, Carole N, DO   HYDROmorphone (DILAUDID) injection 0.5 mg, 0.5 mg, Intravenous, Q4H PRN, Margo Aye,  Carole N, DO   melatonin tablet 5 mg, 5 mg, Oral, QHS PRN, Dow Adolph N, DO   oxyCODONE (Oxy IR/ROXICODONE) immediate release tablet 5 mg, 5 mg, Oral, Q6H PRN, Hall, Carole N, DO   polyethylene glycol (MIRALAX / GLYCOLAX) packet 17 g, 17 g, Oral, Daily PRN, Hall, Carole N, DO   prochlorperazine (COMPAZINE)  injection 5 mg, 5 mg, Intravenous, Q6H PRN, Hall, Carole N, DO, 5 mg at 06/01/23 0518   senna-docusate (Senokot-S) tablet 1 tablet, 1 tablet, Oral, QHS, Hall, Carole N, DO:   enoxaparin (LOVENOX) injection  30 mg Subcutaneous Q24H   senna-docusate  1 tablet Oral QHS  :   Allergies  Allergen Reactions   Hydrocodone Other (See Comments)    Delirium. Pt states tolerated Percocet on previous admission   Promethazine Hcl Other (See Comments)    delirium  :  FH: A maternal aunt had colon cancer  SOCIAL HISTORY: He lives with her children in [place].  She works in Clinical biochemist at [employer].  She smokes 1 pack of cigarettes per day.  She does not use alcohol.  No transfusion history.  No risk factor for HIV or hepatitis.  Review of Systems:  Positives include: Upper abdominal pain, nausea and vomiting  A complete ROS was otherwise negative.   Physical Exam:  Blood pressure 110/64, pulse (!) 54, temperature 98.6 F (37 C), temperature source Oral, resp. rate 16, height 5' 2.99" (1.6 m), weight 119 lb (54 kg), SpO2 98 %, unknown if currently breastfeeding.  HEENT: 2 mm white areas at the left tonsillar fossa and pharynx, no ulcers or mass Lungs: Clear bilaterally, no respiratory distress Cardiac: Regular rate and rhythm Abdomen: No hepatosplenomegaly, mild tenderness in the left subcostal region, no mass Vascular: No leg edema Lymph nodes: No cervical, supraclavicular, axillary, or inguinal nodes Neurologic: Alert and oriented, the motor exam appears intact in the upper and lower extremities bilaterally Skin: No rash Musculoskeletal: Ace bandage covering the left hand and wrist, no spine tenderness  LABS:   Recent Labs    05/31/23 1847 06/01/23 0350  WBC 16.6* 13.1*  HGB 15.3* 12.7  HCT 44.4 37.9  PLT 342 287     Recent Labs    05/31/23 1847 06/01/23 0350  NA 139  --   K 4.0  --   CL 99  --   CO2 27  --   GLUCOSE 97  --   BUN 10  --   CREATININE  0.86 0.86  CALCIUM 10.7*  --       RADIOLOGY:  CT ABDOMEN PELVIS W CONTRAST  Result Date: 05/31/2023 CLINICAL DATA:  Acute abdominal pain EXAM: CT ABDOMEN AND PELVIS WITH CONTRAST TECHNIQUE: Multidetector CT imaging of the abdomen and pelvis was performed using the standard protocol following bolus administration of intravenous contrast. RADIATION DOSE REDUCTION: This exam was performed according to the departmental dose-optimization program which includes automated exposure control, adjustment of the mA and/or kV according to patient size and/or use of iterative reconstruction technique. CONTRAST:  75mL OMNIPAQUE IOHEXOL 300 MG/ML  SOLN COMPARISON:  CT examination dated May 25, 2019 FINDINGS: Lower chest: No acute abnormality. Hepatobiliary: Focal fatty infiltration along the falciform ligament. No gallstones, gallbladder wall thickening or biliary dilatation. Pancreas: Unremarkable. No pancreatic ductal dilatation or surrounding inflammatory changes. Spleen: Normal in size without focal abnormality. Adrenals/Urinary Tract: Adrenal glands are unremarkable. Kidneys are normal, without renal calculi, focal lesion, or hydronephrosis. Bladder is unremarkable. Stomach/Bowel: Gastric wall thickening of the  distal gastric body, unchanged. Small bowel loops are normal in caliber. Appendix not identified, however no inflammatory changes in the pericecal region. No evidence of bowel wall thickening, distention, or inflammatory changes. Retained contrast in the rectosigmoid region, likely secondary to prior CT examination dated Vascular/Lymphatic: Aortic atherosclerosis. No enlarged abdominal or pelvic lymph nodes. Reproductive: Uterus and bilateral adnexa are unremarkable. Other: Gastrohepatic lymph nodes suggesting metastatic disease. No abdominopelvic ascites. Musculoskeletal: No acute or significant osseous findings. IMPRESSION: 1. Gastric wall thickening of the distal gastric body, unchanged, which may suggest  patient's known gastric malignancy. 2. Gastrohepatic lymph nodes suggesting metastatic disease. 3. No evidence of bowel obstruction. No free air or fluid. 4. Aortic atherosclerosis. Aortic Atherosclerosis (ICD10-I70.0). Electronically Signed   By: Larose Hires D.O.   On: 05/31/2023 20:46   CT ABDOMEN PELVIS W CONTRAST  Result Date: 05/28/2023 CLINICAL DATA:  Newly diagnosed gastric carcinoma. Staging. * Tracking Code: BO * EXAM: CT ABDOMEN AND PELVIS WITH CONTRAST TECHNIQUE: Multidetector CT imaging of the abdomen and pelvis was performed using the standard protocol following bolus administration of intravenous contrast. RADIATION DOSE REDUCTION: This exam was performed according to the departmental dose-optimization program which includes automated exposure control, adjustment of the mA and/or kV according to patient size and/or use of iterative reconstruction technique. CONTRAST:  ISOVUE-300 IOPAMIDOL (ISOVUE-300) INJECTION 61% COMPARISON:  Noncontrast CT on 03/13/2021 FINDINGS: Lower Chest: No acute findings. Hepatobiliary: No hepatic masses identified. Gallbladder is unremarkable. No evidence of biliary ductal dilatation. Pancreas:  No mass or inflammatory changes. Spleen: Within normal limits in size and appearance. Adrenals/Urinary Tract: No suspicious masses identified. No evidence of ureteral calculi or hydronephrosis. Stomach/Bowel: Mild asymmetric wall thickening is seen along the lesser curvature of the distal gastric body, suspicious for known primary gastric carcinoma. No evidence of bowel obstruction, inflammatory process or abnormal fluid collections. Vascular/Lymphatic: Mild lymphadenopathy is seen in the gastrohepatic ligament, with largest lymph node measuring 18 mm on image 17/2. Aortic atherosclerotic calcification incidentally noted. No acute vascular findings. Reproductive:  No mass or other significant abnormality. Other: Several soft tissue nodules are seen in the gastrocolic  ligament in the anterior mid and right abdomen, largest measuring 10 mm on image 31/2. This may be due to lymphadenopathy or peritoneal metastatic disease. No evidence of ascites. Musculoskeletal:  No suspicious bone lesions identified. IMPRESSION: Mild asymmetric wall thickening along the lesser curvature of the distal gastric body, suspicious for known primary gastric carcinoma. Mild gastrohepatic ligament lymphadenopathy, consistent with metastatic disease. Several small soft tissue nodules in the gastrocolic ligament, which may be due to lymphadenopathy or peritoneal metastatic disease. Aortic Atherosclerosis (ICD10-I70.0). Electronically Signed   By: Danae Orleans M.D.   On: 05/28/2023 16:53    Assessment and Plan:  . MALT lymphoma of the stomach 02/05/2023-EGD-2 nonbleeding gastric ulcers in the incisor and gastric antrum, largest measured 10 mm, congestion, discoloration, nodularity, ulceration, and inflammation in the gastric antrum.  Nodularity and congestion in the gastric body and lesser curvature, gastric cardia, fundus, duodenum appeared normal. Pathology from the gastric antrum and "esophagus "revealed benign ulcerative gastritis with an atypical lymphoid infiltrate H. pylori negative consistent with a MALT lymphoma, B lymphocytes CD20 positive, cyclin D1 negative, B-cell gene rearrangement positive 04/09/2023-EGD-no endoscopic evidence of ulceration, post ulcer scarring, nodularity in the gastric body, prepyloric region and gastric antrum-biopsy revealed an atypical lymphoid infiltrate consistent with a lymphoid neoplasm CT abdomen/pelvis 05/25/2023-mild wall thickening at the lesser curvature of the distal gastric body, mild gastropathic ligament lymphadenopathy, soft  tissue nodules in the gastrocolic ligament CT abdomen/pelvis 05/31/2023-unchanged distal gastric body wall thickening, gastrohepatic lymph nodes  2.  Nausea/vomiting and abdominal pain secondary to #1 3.  05/22/2023-left index  finger ganglion cyst excision, left palmar mass excision-benign epidermal inclusion cyst 4.  Leukocytosis-likely secondary to tobacco use 5.  Tobacco use  Nina Horn is admitted with nausea and vomiting.  She has been diagnosed with gastric MALT lymphoma.  The lymphoma involves multiple gastric biopsies.  A CT reveals evidence of upper abdominal adenopathy and peritoneal nodules.  I reviewed the CT findings and images.  I will recommend treatment with systemic therapy for the gastric MALT lymphoma.  She will likely be treated with single agent rituximab or Bendamustine/rituximab.  She needs further staging with an outpatient PET scan, and potentially biopsy of a upper abdominal lymph node or peritoneal nodule.  She can be followed up as an outpatient if she can tolerate a liquid diet.  Recommendations: LDH Serum immunofixation and serum free light chain analysis Outpatient PET scan Supportive care with antiemetics, intravenous hydration, and diet as tolerated Please call oncology over the weekend as needed, I will check on her 06/04/2023 and outpatient follow-up will be scheduled at the Cancer center   Thornton Papas, MD 06/01/2023, 8:04 AM

## 2023-06-01 NOTE — Consult Note (Signed)
Reason for Consult: MALT lymphoma Referring Physician: Hospital team  Nina Horn is an 52 y.o. female.  HPI: Patient seen and examined in her hospital computer chart and our office computer chart reviewed and her case discussed with her primary gastroenterologist Dr. Levora Angel and she has had pain and nausea vomiting and supposedly he has been trying to get her into see the oncologist for months and even took a while to do a CT scan and agree with Dr. Truett Perna with oncology's note and she is doing better with her scopolamine patch and her IV nausea medicine helps and she believes she would like to eat some clear liquids and did move her bowels with the stool softener  Past Medical History:  Diagnosis Date   Depression    Migraines     Past Surgical History:  Procedure Laterality Date   PILONIDAL CYST EXCISION     WISDOM TOOTH EXTRACTION      Family History  Problem Relation Age of Onset   Skeletal dysplasia Daughter        Thanatophoric dysplasia    Social History:  reports that she has been smoking cigarettes. She has been smoking an average of 1 pack per day. She has never used smokeless tobacco. She reports current drug use. Drug: Marijuana. She reports that she does not drink alcohol.  Allergies:  Allergies  Allergen Reactions   Hydrocodone Other (See Comments)    Delirium. Pt states tolerated Percocet on previous admission   Promethazine Hcl Other (See Comments)    delirium    Medications: I have reviewed the patient's current medications.  Results for orders placed or performed during the hospital encounter of 05/31/23 (from the past 48 hour(s))  Lipase, blood     Status: None   Collection Time: 05/31/23  6:47 PM  Result Value Ref Range   Lipase 43 11 - 51 U/L    Comment: Performed at [facility], [address], [place], [place] [address]  Comprehensive metabolic panel     Status: Abnormal   Collection Time: 05/31/23  6:47 PM  Result  Value Ref Range   Sodium 139 135 - 145 mmol/L   Potassium 4.0 3.5 - 5.1 mmol/L   Chloride 99 98 - 111 mmol/L   CO2 27 22 - 32 mmol/L   Glucose, Bld 97 70 - 99 mg/dL    Comment: Glucose reference range applies only to samples taken after fasting for at least 8 hours.   BUN 10 6 - 20 mg/dL   Creatinine, Ser 9.14 0.44 - 1.00 mg/dL   Calcium 78.2 (H) 8.9 - 10.3 mg/dL   Total Protein 8.3 (H) 6.5 - 8.1 g/dL   Albumin 4.8 3.5 - 5.0 g/dL   AST 14 (L) 15 - 41 U/L   ALT 8 0 - 44 U/L   Alkaline Phosphatase 89 38 - 126 U/L   Total Bilirubin 0.4 0.3 - 1.2 mg/dL   GFR, Estimated >95 >62 mL/min    Comment: (NOTE) Calculated using the CKD-EPI Creatinine Equation (2021)    Anion gap 13 5 - 15    Comment: Performed at [facility], [address], [place], [place] [address]  CBC     Status: Abnormal   Collection Time: 05/31/23  6:47 PM  Result Value Ref Range   WBC 16.6 (H) 4.0 - 10.5 K/uL   RBC 5.00 3.87 - 5.11 MIL/uL   Hemoglobin 15.3 (H) 12.0 - 15.0 g/dL   HCT 57.8 46.9 -  46.0 %   MCV 88.8 80.0 - 100.0 fL   MCH 30.6 26.0 - 34.0 pg   MCHC 34.5 30.0 - 36.0 g/dL   RDW 16.1 09.6 - 04.5 %   Platelets 342 150 - 400 K/uL   nRBC 0.0 0.0 - 0.2 %    Comment: Performed at [facility], [address], [place], [place] [address]  Urinalysis, Routine w reflex microscopic -Urine, Clean Catch     Status: Abnormal   Collection Time: 05/31/23  7:24 PM  Result Value Ref Range   Color, Urine YELLOW YELLOW   APPearance CLEAR CLEAR   Specific Gravity, Urine 1.011 1.005 - 1.030   pH 6.5 5.0 - 8.0   Glucose, UA NEGATIVE NEGATIVE mg/dL   Hgb urine dipstick SMALL (A) NEGATIVE   Bilirubin Urine NEGATIVE NEGATIVE   Ketones, ur 40 (A) NEGATIVE mg/dL   Protein, ur NEGATIVE NEGATIVE mg/dL   Nitrite NEGATIVE NEGATIVE   Leukocytes,Ua NEGATIVE NEGATIVE   RBC / HPF 6-10 0 - 5 RBC/hpf   WBC, UA 0-5 0 - 5 WBC/hpf   Bacteria, UA RARE (A) NONE SEEN   Squamous Epithelial / HPF  0-5 0 - 5 /HPF   Mucus PRESENT     Comment: Performed at [facility], [address], [place], [place] [address]  Pregnancy, urine     Status: None   Collection Time: 05/31/23  7:24 PM  Result Value Ref Range   Preg Test, Ur NEGATIVE NEGATIVE    Comment:        THE SENSITIVITY OF THIS METHODOLOGY IS >25 mIU/mL. Performed at [facility], [address] [place], [place] [address]   Comprehensive metabolic panel     Status: Abnormal   Collection Time: 06/01/23  3:47 AM  Result Value Ref Range   Sodium 140 135 - 145 mmol/L   Potassium 3.7 3.5 - 5.1 mmol/L   Chloride 99 98 - 111 mmol/L   CO2 25 22 - 32 mmol/L   Glucose, Bld 97 70 - 99 mg/dL    Comment: Glucose reference range applies only to samples taken after fasting for at least 8 hours.   BUN 9 6 - 20 mg/dL   Creatinine, Ser 2.13 0.44 - 1.00 mg/dL   Calcium 9.4 8.9 - 08.6 mg/dL   Total Protein 6.6 6.5 - 8.1 g/dL   Albumin 3.5 3.5 - 5.0 g/dL   AST 14 (L) 15 - 41 U/L   ALT 11 0 - 44 U/L   Alkaline Phosphatase 73 38 - 126 U/L   Total Bilirubin 0.6 0.3 - 1.2 mg/dL   GFR, Estimated >57 >84 mL/min    Comment: (NOTE) Calculated using the CKD-EPI Creatinine Equation (2021)    Anion gap 16 (H) 5 - 15    Comment: Performed at [facility], [address], [place], [place] [address]  Magnesium     Status: None   Collection Time: 06/01/23  3:47 AM  Result Value Ref Range   Magnesium 1.9 1.7 - 2.4 mg/dL    Comment: Performed at [facility], [address] [place], [place] [address]  Phosphorus     Status: None   Collection Time: 06/01/23  3:47 AM  Result Value Ref Range   Phosphorus 3.8 2.5 - 4.6 mg/dL    Comment: Performed at [facility], [address] [place], [place] [address]  HIV Antibody (routine testing w rflx)     Status: None  Collection Time: 06/01/23  3:50 AM  Result Value Ref Range   HIV Screen 4th Generation wRfx Non Reactive Non Reactive    Comment:  Performed at [facility], [address] [place], [place] [address]  CBC     Status: Abnormal   Collection Time: 06/01/23  3:50 AM  Result Value Ref Range   WBC 13.1 (H) 4.0 - 10.5 K/uL   RBC 4.18 3.87 - 5.11 MIL/uL   Hemoglobin 12.7 12.0 - 15.0 g/dL   HCT 60.4 54.0 - 98.1 %   MCV 90.7 80.0 - 100.0 fL   MCH 30.4 26.0 - 34.0 pg   MCHC 33.5 30.0 - 36.0 g/dL   RDW 19.1 47.8 - 29.5 %   Platelets 287 150 - 400 K/uL   nRBC 0.0 0.0 - 0.2 %    Comment: Performed at [facility], [address] [place], [place] [address]  Creatinine, serum     Status: None   Collection Time: 06/01/23  3:50 AM  Result Value Ref Range   Creatinine, Ser 0.86 0.44 - 1.00 mg/dL   GFR, Estimated >86 >57 mL/min    Comment: (NOTE) Calculated using the CKD-EPI Creatinine Equation (2021) Performed at [facility], [address], [place], [place] [address]     CT ABDOMEN PELVIS W CONTRAST  Result Date: 05/31/2023 CLINICAL DATA:  Acute abdominal pain EXAM: CT ABDOMEN AND PELVIS WITH CONTRAST TECHNIQUE: Multidetector CT imaging of the abdomen and pelvis was performed using the standard protocol following bolus administration of intravenous contrast. RADIATION DOSE REDUCTION: This exam was performed according to the departmental dose-optimization program which includes automated exposure control, adjustment of the mA and/or kV according to patient size and/or use of iterative reconstruction technique. CONTRAST:  75mL OMNIPAQUE IOHEXOL 300 MG/ML  SOLN COMPARISON:  CT examination dated May 25, 2019 FINDINGS: Lower chest: No acute abnormality. Hepatobiliary: Focal fatty infiltration along the falciform ligament. No gallstones, gallbladder wall thickening or biliary dilatation. Pancreas: Unremarkable. No pancreatic ductal dilatation or surrounding inflammatory changes. Spleen: Normal in size without focal abnormality. Adrenals/Urinary Tract: Adrenal glands are unremarkable. Kidneys are normal, without renal  calculi, focal lesion, or hydronephrosis. Bladder is unremarkable. Stomach/Bowel: Gastric wall thickening of the distal gastric body, unchanged. Small bowel loops are normal in caliber. Appendix not identified, however no inflammatory changes in the pericecal region. No evidence of bowel wall thickening, distention, or inflammatory changes. Retained contrast in the rectosigmoid region, likely secondary to prior CT examination dated Vascular/Lymphatic: Aortic atherosclerosis. No enlarged abdominal or pelvic lymph nodes. Reproductive: Uterus and bilateral adnexa are unremarkable. Other: Gastrohepatic lymph nodes suggesting metastatic disease. No abdominopelvic ascites. Musculoskeletal: No acute or significant osseous findings. IMPRESSION: 1. Gastric wall thickening of the distal gastric body, unchanged, which may suggest patient's known gastric malignancy. 2. Gastrohepatic lymph nodes suggesting metastatic disease. 3. No evidence of bowel obstruction. No free air or fluid. 4. Aortic atherosclerosis. Aortic Atherosclerosis (ICD10-I70.0). Electronically Signed   By: Larose Hires D.O.   On: 05/31/2023 20:46    ROS except above Blood pressure 122/74, pulse (!) 55, temperature 98.3 F (36.8 C), temperature source Oral, resp. rate 16, height 5' 2.99" (1.6 m), weight 54 kg, SpO2 100 %, unknown if currently breastfeeding. Physical Exam vital signs stable afebrile no acute distress abdomen is minimally upper discomfort but soft no guarding or rebound chemistry is okay CBC okay except for mildly elevated white count CT scan unchanged  Assessment/Plan: While lymphoma Plan: Agree with oncology's  note symptomatic relief as much as possible and chemotherapy and possible radiation therapy per their note and please call my partner Dr. Marca Ancona this weekend if we can be of any further assistance  [facility] 06/01/2023, 3:09 PM

## 2023-06-01 NOTE — Progress Notes (Signed)
PROGRESS NOTE    Nina Horn  YNW:[id number] DOB: 12/22/1970 DOA: 05/31/2023 PCP: Norm Salt, PA   Brief Narrative:  The patient is a 52 year old thin African-American female with past medical history significant for but not limited to tobacco abuse disorder, newly diagnosed gastric malignancy as well as other comorbidities who was scheduled for her first medical oncology appointment on Monday, 06/04/2023.  She follows with Dr. Levora Angel of [facility] initially [facility] with complaint of intractable nausea and vomiting for 4 days.  She is noted to have associated abdominal cramping intermittently that lasted a few minutes and then started resolving.  She also endorsed left hand surgery about a week ago and has been prescribed pain medication which caused her to become constipated with no bowel movement 4 days.  Further workup was done in the ED she had a CT scan of the abdomen pelvis which revealed gastric wall thickening of the distal gastric body which was unchanged as well as gastrohepatic lymph nodes suggesting metastatic disease and no evidence of bowel obstruction.  Left medical oncology was consulted as well as [facility] and the patient was admitted to [facility] hospitalist service and she was accepted at [facility] long however due to no bed 30 she was transferred to [facility] for further evaluation.  Patient was seen and examined and admitted early this morning after midnight by Dr. Dow Adolph and I am in current agreement with her assessment and plan with additional changes to plan of care being made accordingly.  Assessment and Plan:  Intractable nausea and vomiting x 4 days in the setting of newly diagnosed gastric malignancy -IV antiemetics as needed and will continue IV Zofran and she has 5 mg of promethazine; will start scopolamine patch -Continue IV fluid hydration NS at 50 cc/h x 2 days -Will attempt to clear liquid diet -C/w Supportive Care   Recently  diagnosed gastric malignancy (MALT Lymphoma) -The patient endorses she had an EGD done by GI outpatient with findings suggestive of gastric malignancy, couldn't find records. -Abnormal gastric findings also seen on abdominal CT scan done on 05/31/2023 at [facility] ED -Medical oncology Dr. Truett Perna and [facility] Dr. Lorenso Quarry consulted to assist with the management -Medical oncology recommending LDH, serum immunofixation and serum free light chains analysis as well as outpatient PET scan -Medical oncology recommend supportive care with antiemetics, IV fluid hydration as well as diet as tolerated and Dr. Leeanne Deed will check on her on Monday   Leukocytosis -Suspect reactive in the setting of presumed gastric Malignancy -WBC on presentation 13.1 K -WBC Trend: Recent Labs  Lab 05/31/23 1847 06/01/23 0350  WBC 16.6* 13.1*  -Afebrile and non septic appearing -Continue to Monitor WBCs   Tobacco Use disorder -Recommend complete cessation of tobacco use   Chronic Constipation -Start bowel regimen   Recent left hand surgery -Per the patient, her left hand was operated at [facility] -As needed pain management and bowel regimen in place -Order Sling for the Patient    DVT prophylaxis: enoxaparin (LOVENOX) injection 30 mg Start: 06/01/23 1000    Code Status: Full Code Family Communication: No family present at bedside   Disposition Plan:  Level of care: Telemetry Medical Status is: Observation The patient remains OBS appropriate and will d/c before 2 midnights.   Consultants:  Medical Oncology Gastroenterology   Procedures:  As delineated as above  Antimicrobials:  Anti-infectives (From admission, onward)    None       Objective:  Vitals:   06/01/23 0720 06/01/23 0723 06/01/23 1151 06/01/23 1511  BP: 110/64  122/74 112/66  Pulse: (!) 54  (!) 55 (!) 54  Resp: 16  16 16   Temp: 98.6 F (37 C)  98.3 F (36.8 C) 98.9 F (37.2 C)  TempSrc: Oral Oral Oral Oral  SpO2: 98%  100%  99%  Weight:      Height:  5' 2.99" (1.6 m)      Intake/Output Summary (Last 24 hours) at 06/01/2023 1730 Last data filed at 06/01/2023 0945 Gross per 24 hour  Intake 1003.82 ml  Output --  Net 1003.82 ml   Filed Weights   05/31/23 1834  Weight: 54 kg   CBC: Recent Labs  Lab 05/31/23 1847 06/01/23 0350 06/01/23 1456  WBC 16.6* 13.1*  --   NEUTROABS  --   --  8.1*  HGB 15.3* 12.7  --   HCT 44.4 37.9  --   MCV 88.8 90.7  --   PLT 342 287  --    Basic Metabolic Panel: Recent Labs  Lab 05/31/23 1847 06/01/23 0347 06/01/23 0350  NA 139 140  --   K 4.0 3.7  --   CL 99 99  --   CO2 27 25  --   GLUCOSE 97 97  --   BUN 10 9  --   CREATININE 0.86 0.85 0.86  CALCIUM 10.7* 9.4  --   MG  --  1.9  --   PHOS  --  3.8  --    GFR: Estimated Creatinine Clearance: 64.7 mL/min (by C-G formula based on SCr of 0.86 mg/dL). Liver Function Tests: Recent Labs  Lab 05/31/23 1847 06/01/23 0347  AST 14* 14*  ALT 8 11  ALKPHOS 89 73  BILITOT 0.4 0.6  PROT 8.3* 6.6  ALBUMIN 4.8 3.5   Recent Labs  Lab 05/31/23 1847  LIPASE 43   No results for input(s): "AMMONIA" in the last 168 hours. Coagulation Profile: No results for input(s): "INR", "PROTIME" in the last 168 hours. Cardiac Enzymes: No results for input(s): "CKTOTAL", "CKMB", "CKMBINDEX", "TROPONINI" in the last 168 hours. BNP (last 3 results) No results for input(s): "PROBNP" in the last 8760 hours. HbA1C: No results for input(s): "HGBA1C" in the last 72 hours. CBG: No results for input(s): "GLUCAP" in the last 168 hours. Lipid Profile: No results for input(s): "CHOL", "HDL", "LDLCALC", "TRIG", "CHOLHDL", "LDLDIRECT" in the last 72 hours. Thyroid Function Tests: No results for input(s): "TSH", "T4TOTAL", "FREET4", "T3FREE", "THYROIDAB" in the last 72 hours. Anemia Panel: No results for input(s): "VITAMINB12", "FOLATE", "FERRITIN", "TIBC", "IRON", "RETICCTPCT" in the last 72 hours. Sepsis Labs: No results for  input(s): "PROCALCITON", "LATICACIDVEN" in the last 168 hours.  No results found for this or any previous visit (from the past 240 hour(s)).   Radiology Studies: CT ABDOMEN PELVIS W CONTRAST  Result Date: 05/31/2023 CLINICAL DATA:  Acute abdominal pain EXAM: CT ABDOMEN AND PELVIS WITH CONTRAST TECHNIQUE: Multidetector CT imaging of the abdomen and pelvis was performed using the standard protocol following bolus administration of intravenous contrast. RADIATION DOSE REDUCTION: This exam was performed according to the departmental dose-optimization program which includes automated exposure control, adjustment of the mA and/or kV according to patient size and/or use of iterative reconstruction technique. CONTRAST:  75mL OMNIPAQUE IOHEXOL 300 MG/ML  SOLN COMPARISON:  CT examination dated May 25, 2019 FINDINGS: Lower chest: No acute abnormality. Hepatobiliary: Focal fatty infiltration along the falciform ligament. No gallstones, gallbladder wall thickening or  biliary dilatation. Pancreas: Unremarkable. No pancreatic ductal dilatation or surrounding inflammatory changes. Spleen: Normal in size without focal abnormality. Adrenals/Urinary Tract: Adrenal glands are unremarkable. Kidneys are normal, without renal calculi, focal lesion, or hydronephrosis. Bladder is unremarkable. Stomach/Bowel: Gastric wall thickening of the distal gastric body, unchanged. Small bowel loops are normal in caliber. Appendix not identified, however no inflammatory changes in the pericecal region. No evidence of bowel wall thickening, distention, or inflammatory changes. Retained contrast in the rectosigmoid region, likely secondary to prior CT examination dated Vascular/Lymphatic: Aortic atherosclerosis. No enlarged abdominal or pelvic lymph nodes. Reproductive: Uterus and bilateral adnexa are unremarkable. Other: Gastrohepatic lymph nodes suggesting metastatic disease. No abdominopelvic ascites. Musculoskeletal: No acute or significant  osseous findings. IMPRESSION: 1. Gastric wall thickening of the distal gastric body, unchanged, which may suggest patient's known gastric malignancy. 2. Gastrohepatic lymph nodes suggesting metastatic disease. 3. No evidence of bowel obstruction. No free air or fluid. 4. Aortic atherosclerosis. Aortic Atherosclerosis (ICD10-I70.0). Electronically Signed   By: Larose Hires D.O.   On: 05/31/2023 20:46    Scheduled Meds:  enoxaparin (LOVENOX) injection  30 mg Subcutaneous Q24H   scopolamine  1 patch Transdermal Q72H   senna-docusate  1 tablet Oral QHS   Continuous Infusions:  sodium chloride 50 mL/hr at 06/01/23 0555    LOS: 0 days   Marguerita Merles, DO [facility] Available via Epic secure chat 7am-7pm After these hours, please refer to coverage provider listed on [email or web address] 06/01/2023, 5:30 PM

## 2023-06-01 NOTE — Progress Notes (Signed)
Orthopedic Tech Progress Note Patient Details:  Nina Horn 28-Jun-1971 [id number]   Sling delivered to pt room, but not applied as she was in the process of having labs drawn from the L arm. Pt has worn a sling before and is aware of how to wear/adjust it.  Ortho Devices Type of Ortho Device: Arm sling Ortho Device/Splint Location: for LUE Ortho Device/Splint Interventions: Ordered   Post Interventions Instructions Provided: Care of device, Adjustment of device  Brookes Craine [name] 06/01/2023, 5:02 PM

## 2023-06-02 DIAGNOSIS — C884 Extranodal marginal zone B-cell lymphoma of mucosa-associated lymphoid tissue [MALT-lymphoma]: Secondary | ICD-10-CM | POA: Diagnosis not present

## 2023-06-02 DIAGNOSIS — R112 Nausea with vomiting, unspecified: Secondary | ICD-10-CM | POA: Diagnosis not present

## 2023-06-02 LAB — MAGNESIUM: Magnesium: 2 mg/dL (ref 1.7–2.4)

## 2023-06-02 LAB — COMPREHENSIVE METABOLIC PANEL
ALT: 10 U/L (ref 0–44)
AST: 11 U/L — ABNORMAL LOW (ref 15–41)
Albumin: 3.3 g/dL — ABNORMAL LOW (ref 3.5–5.0)
Alkaline Phosphatase: 66 U/L (ref 38–126)
Anion gap: 10 (ref 5–15)
BUN: 8 mg/dL (ref 6–20)
CO2: 26 mmol/L (ref 22–32)
Calcium: 9 mg/dL (ref 8.9–10.3)
Chloride: 102 mmol/L (ref 98–111)
Creatinine, Ser: 0.77 mg/dL (ref 0.44–1.00)
GFR, Estimated: >60 mL/min (ref >60)
Glucose, Bld: 99 mg/dL (ref 70–99)
Potassium: 3.5 mmol/L (ref 3.5–5.1)
Sodium: 138 mmol/L (ref 135–145)
Total Bilirubin: 0.3 mg/dL (ref 0.3–1.2)
Total Protein: 6 g/dL — ABNORMAL LOW (ref 6.5–8.1)

## 2023-06-02 LAB — CBC
HCT: 35.7 % — ABNORMAL LOW (ref 36.0–46.0)
Hemoglobin: 11.9 g/dL — ABNORMAL LOW (ref 12.0–15.0)
MCH: 30.5 pg (ref 26.0–34.0)
MCHC: 33.3 g/dL (ref 30.0–36.0)
MCV: 91.5 fL (ref 80.0–100.0)
Platelets: 279 10*3/uL (ref 150–400)
RBC: 3.9 MIL/uL (ref 3.87–5.11)
RDW: 13.3 % (ref 11.5–15.5)
WBC: 9.3 10*3/uL (ref 4.0–10.5)
nRBC: 0 % (ref 0.0–0.2)

## 2023-06-02 LAB — PHOSPHORUS: Phosphorus: 4.3 mg/dL (ref 2.5–4.6)

## 2023-06-02 LAB — BETA 2 MICROGLOBULIN, SERUM: Beta-2 Microglobulin: 1.4 mg/L (ref 0.6–2.4)

## 2023-06-02 MED ORDER — ACETAMINOPHEN 325 MG PO TABS: 650.0000 mg | ORAL_TABLET | Freq: Four times a day (QID) | ORAL | 0 refills | Status: DC | PRN

## 2023-06-02 MED ORDER — TRANSDERM-SCOP 1 MG/3DAYS TD PT72: 1.0000 | MEDICATED_PATCH | TRANSDERMAL | 0 refills | Status: DC

## 2023-06-02 MED ORDER — SENNOSIDES-DOCUSATE SODIUM 8.6-50 MG PO TABS: 1.0000 | ORAL_TABLET | Freq: Every day | ORAL | 0 refills | Status: DC

## 2023-06-02 MED ORDER — POLYETHYLENE GLYCOL 3350 17 G PO PACK: 17.0000 g | PACK | Freq: Every day | ORAL | 0 refills | Status: DC | PRN

## 2023-06-02 NOTE — Discharge Summary (Signed)
Physician Discharge Summary   Patient: Nina Horn MRN: [id number] DOB: [birth year]  Admit date:     05/31/2023  Discharge date: 06/03/23  Discharge Physician: Marguerita Merles, DO   PCP: Norm Salt, [place]   Recommendations at discharge:   With PCP within 1 to 2 weeks and repeat CBC, CMP, mag, Phos within 1 week Follow-up with medical oncology on Monday, 06/04/2023 Follow-up with gastroenterology in outpatient setting Follow-up with hand surgery in outpatient setting Follow-up on the serum immunofixation and light chain analysis in the outpatient setting  Discharge Diagnoses: Principal Problem:   Intractable nausea and vomiting  Resolved Problems:   * No resolved hospital problems. [facility] Hospital Course: The patient is a 52 year old thin African-American female with past medical history significant for but not limited to tobacco abuse disorder, newly diagnosed gastric malignancy as well as other comorbidities who was scheduled for her first medical oncology appointment on Monday, 06/04/2023.  She follows with Dr. Levora Angel of [facility] initially [facility] with complaint of intractable nausea and vomiting for 4 days.  She is noted to have associated abdominal cramping intermittently that lasted a few minutes and then started resolving.  She also endorsed left hand surgery about a week ago and has been prescribed pain medication which caused her to become constipated with no bowel movement 4 days.  Further workup was done in the ED she had a CT scan of the abdomen pelvis which revealed gastric wall thickening of the distal gastric body which was unchanged as well as gastrohepatic lymph nodes suggesting metastatic disease and no evidence of bowel obstruction.  Left medical oncology was consulted as well as [facility] and the patient was admitted to [facility] hospitalist service and she was accepted at [facility] long however due to no bed 30 she was transferred to [facility] for  further evaluation.  Patient was seen and examined and admitted early this morning after midnight by Dr. Dow Adolph and I am in current agreement with her assessment and plan with additional changes to plan of care being made accordingly.  Assessment and Plan:  Intractable nausea and vomiting x 4 days in the setting of newly diagnosed gastric malignancy -IV antiemetics as needed and will continue IV Zofran and she has 5 mg of promethazine; will start scopolamine patch -Continue IV fluid hydration NS at 50 cc/h x 2 days -Will attempt to clear liquid diet -C/w Supportive Care and she is much improved and tolerated her diet without issues.   Recently diagnosed gastric malignancy (MALT Lymphoma) -The patient endorses she had an EGD done by GI outpatient with findings suggestive of gastric malignancy, couldn't find records. -Abnormal gastric findings also seen on abdominal CT scan done on 05/31/2023 at [facility] ED -Medical oncology Dr. Truett Perna and [facility] Dr. Lorenso Quarry consulted to assist with the management -Medical oncology recommending LDH, serum immunofixation and serum free light chains analysis as well as outpatient PET scan -Medical oncology recommend supportive care with antiemetics, IV fluid hydration as well as diet as tolerated and Dr. Elnita Maxwell will check on her on Monday; patient tolerated her diet and was stable for discharge and will have further workup in outpatient setting will need to follow-up on the immunofixation and serum light chain analysis   Leukocytosis -Suspect reactive in the setting of presumed gastric Malignancy -WBC on presentation 13.1 K -WBC Trend: Recent Labs  Lab 05/31/23 1847 06/01/23 0350 06/02/23 0207  WBC 16.6* 13.1* 9.3  -Afebrile and non septic  appearing -Continue to Monitor WBCs in outpatient setting  Normocytic Anemia -Likely Hemoconcentrated on Admission due to Nausea and Vomiting and now likely dilutional drop -Hgb/Hct Trend: Recent Labs  Lab  05/31/23 1847 06/01/23 0350 06/02/23 0207  HGB 15.3* 12.7 11.9*  HCT 44.4 37.9 35.7*  MCV 88.8 90.7 91.5  -Check Anemia Panel in the outpatient setting   Tobacco Use disorder -Recommend complete cessation of tobacco use   Chronic Constipation -Start bowel regimen   Recent left hand surgery -Per the patient, her left hand was operated at [facility] -As needed pain management and bowel regimen in place -Order Sling for the Patient and follow up care per her Hand Surgeon   Hypoalbuminemia -Patient's Albumin Trend: Recent Labs  Lab 05/31/23 1847 06/01/23 0347 06/02/23 0207  ALBUMIN 4.8 3.5 3.3*  -Continue to Monitor and Trend and repeat CMP in the AM   Consultants: Gastroenterology, medical oncology Procedures performed: As delineated above Disposition: Home Diet recommendation:  Cardiac diet DISCHARGE MEDICATION: Allergies as of 06/02/2023       Reactions   Promethazine Hcl Other (See Comments)   delirium        Medication List     STOP taking these medications    phenazopyridine 200 MG tablet Commonly known as: PYRIDIUM       TAKE these medications    acetaminophen 325 MG tablet Commonly known as: TYLENOL Take 2 tablets (650 mg total) by mouth every 6 (six) hours as needed for mild pain, fever or headache.   HYDROcodone-acetaminophen 5-325 MG tablet Commonly known as: NORCO/VICODIN Take 1 tablet by mouth every 6 (six) hours as needed for moderate pain.   ondansetron 4 MG tablet Commonly known as: ZOFRAN Take 4 mg by mouth every 8 (eight) hours as needed for vomiting or nausea.   pantoprazole 40 MG tablet Commonly known as: PROTONIX Take 1 tablet (40 mg total) by mouth daily.   polyethylene glycol 17 g packet Commonly known as: MIRALAX / GLYCOLAX Take 17 g by mouth daily as needed for mild constipation.   senna-docusate 8.6-50 MG tablet Commonly known as: Senokot-S Take 1 tablet by mouth at bedtime.   sucralfate 1 g tablet Commonly  known as: CARAFATE Take 1 tablet (1 g total) by mouth 4 (four) times daily -  with meals and at bedtime.   Transderm-Scop 1 MG/3DAYS Generic drug: scopolamine Place 1 patch (1.5 mg total) onto the skin every 3 (three) days. Start taking on: June 04, 2023        Discharge Exam: Ceasar Mons Weights   05/31/23 1834  Weight: 54 kg   Vitals:   06/02/23 0558 06/02/23 0723  BP: 107/70 115/63  Pulse: (!) 51 (!) 57  Resp: 17 16  Temp: 98.4 F (36.9 C) 98.9 F (37.2 C)  SpO2: 100% 100%   Examination: Physical Exam:  Constitutional: Thin African-American female in no acute distress appears calm Respiratory: Diminished to auscultation bilaterally, no wheezing, rales, rhonchi or crackles. Normal respiratory effort and patient is not tachypenic. No accessory muscle use.  Unlabored breathing Cardiovascular: RRR, no murmurs / rubs / gallops. S1 and S2 auscultated.  Abdomen: Soft, non-tender, non-distended. Bowel sounds positive.  GU: Deferred. Musculoskeletal: No clubbing / cyanosis of digits/nails.  Left hand is wrapped Skin: No rashes, lesions, ulcers. No induration; Warm and dry.  Neurologic: CN 2-12 grossly intact with no focal deficits. Romberg sign and cerebellar reflexes not assessed.  Psychiatric: Normal judgment and insight. Alert and oriented x 3. Normal mood and  appropriate affect.   Condition at discharge: stable  The results of significant diagnostics from this hospitalization (including imaging, microbiology, ancillary and laboratory) are listed below for reference.   Imaging Studies: CT ABDOMEN PELVIS W CONTRAST  Result Date: 05/31/2023 CLINICAL DATA:  Acute abdominal pain EXAM: CT ABDOMEN AND PELVIS WITH CONTRAST TECHNIQUE: Multidetector CT imaging of the abdomen and pelvis was performed using the standard protocol following bolus administration of intravenous contrast. RADIATION DOSE REDUCTION: This exam was performed according to the departmental dose-optimization program  which includes automated exposure control, adjustment of the mA and/or kV according to patient size and/or use of iterative reconstruction technique. CONTRAST:  75mL OMNIPAQUE IOHEXOL 300 MG/ML  SOLN COMPARISON:  CT examination dated May 25, 2019 FINDINGS: Lower chest: No acute abnormality. Hepatobiliary: Focal fatty infiltration along the falciform ligament. No gallstones, gallbladder wall thickening or biliary dilatation. Pancreas: Unremarkable. No pancreatic ductal dilatation or surrounding inflammatory changes. Spleen: Normal in size without focal abnormality. Adrenals/Urinary Tract: Adrenal glands are unremarkable. Kidneys are normal, without renal calculi, focal lesion, or hydronephrosis. Bladder is unremarkable. Stomach/Bowel: Gastric wall thickening of the distal gastric body, unchanged. Small bowel loops are normal in caliber. Appendix not identified, however no inflammatory changes in the pericecal region. No evidence of bowel wall thickening, distention, or inflammatory changes. Retained contrast in the rectosigmoid region, likely secondary to prior CT examination dated Vascular/Lymphatic: Aortic atherosclerosis. No enlarged abdominal or pelvic lymph nodes. Reproductive: Uterus and bilateral adnexa are unremarkable. Other: Gastrohepatic lymph nodes suggesting metastatic disease. No abdominopelvic ascites. Musculoskeletal: No acute or significant osseous findings. IMPRESSION: 1. Gastric wall thickening of the distal gastric body, unchanged, which may suggest patient's known gastric malignancy. 2. Gastrohepatic lymph nodes suggesting metastatic disease. 3. No evidence of bowel obstruction. No free air or fluid. 4. Aortic atherosclerosis. Aortic Atherosclerosis (ICD10-I70.0). Electronically Signed   By: Larose Hires D.O.   On: 05/31/2023 20:46   CT ABDOMEN PELVIS W CONTRAST  Result Date: 05/28/2023 CLINICAL DATA:  Newly diagnosed gastric carcinoma. Staging. * Tracking Code: BO * EXAM: CT ABDOMEN AND  PELVIS WITH CONTRAST TECHNIQUE: Multidetector CT imaging of the abdomen and pelvis was performed using the standard protocol following bolus administration of intravenous contrast. RADIATION DOSE REDUCTION: This exam was performed according to the departmental dose-optimization program which includes automated exposure control, adjustment of the mA and/or kV according to patient size and/or use of iterative reconstruction technique. CONTRAST:  ISOVUE-300 IOPAMIDOL (ISOVUE-300) INJECTION 61% COMPARISON:  Noncontrast CT on 03/13/2021 FINDINGS: Lower Chest: No acute findings. Hepatobiliary: No hepatic masses identified. Gallbladder is unremarkable. No evidence of biliary ductal dilatation. Pancreas:  No mass or inflammatory changes. Spleen: Within normal limits in size and appearance. Adrenals/Urinary Tract: No suspicious masses identified. No evidence of ureteral calculi or hydronephrosis. Stomach/Bowel: Mild asymmetric wall thickening is seen along the lesser curvature of the distal gastric body, suspicious for known primary gastric carcinoma. No evidence of bowel obstruction, inflammatory process or abnormal fluid collections. Vascular/Lymphatic: Mild lymphadenopathy is seen in the gastrohepatic ligament, with largest lymph node measuring 18 mm on image 17/2. Aortic atherosclerotic calcification incidentally noted. No acute vascular findings. Reproductive:  No mass or other significant abnormality. Other: Several soft tissue nodules are seen in the gastrocolic ligament in the anterior mid and right abdomen, largest measuring 10 mm on image 31/2. This may be due to lymphadenopathy or peritoneal metastatic disease. No evidence of ascites. Musculoskeletal:  No suspicious bone lesions identified. IMPRESSION: Mild asymmetric wall thickening along the lesser curvature of  the distal gastric body, suspicious for known primary gastric carcinoma. Mild gastrohepatic ligament lymphadenopathy, consistent with metastatic  disease. Several small soft tissue nodules in the gastrocolic ligament, which may be due to lymphadenopathy or peritoneal metastatic disease. Aortic Atherosclerosis (ICD10-I70.0). Electronically Signed   By: Danae Orleans M.D.   On: 05/28/2023 16:53    Microbiology: Results for orders placed or performed during the hospital encounter of 01/01/15  OB RESULT CONSOLE Group B Strep     Status: None   Collection Time: 12/14/14 12:00 AM  Result Value Ref Range Status   GBS Positive  Final   Labs: CBC: Recent Labs  Lab 05/31/23 1847 06/01/23 0350 06/01/23 1456 06/02/23 0207  WBC 16.6* 13.1*  --  9.3  NEUTROABS  --   --  8.1*  --   HGB 15.3* 12.7  --  11.9*  HCT 44.4 37.9  --  35.7*  MCV 88.8 90.7  --  91.5  PLT 342 287  --  279   Basic Metabolic Panel: Recent Labs  Lab 05/31/23 1847 06/01/23 0347 06/01/23 0350 06/02/23 0207  NA 139 140  --  138  K 4.0 3.7  --  3.5  CL 99 99  --  102  CO2 27 25  --  26  GLUCOSE 97 97  --  99  BUN 10 9  --  8  CREATININE 0.86 0.85 0.86 0.77  CALCIUM 10.7* 9.4  --  9.0  MG  --  1.9  --  2.0  PHOS  --  3.8  --  4.3   Liver Function Tests: Recent Labs  Lab 05/31/23 1847 06/01/23 0347 06/02/23 0207  AST 14* 14* 11*  ALT 8 11 10   ALKPHOS 89 73 66  BILITOT 0.4 0.6 0.3  PROT 8.3* 6.6 6.0*  ALBUMIN 4.8 3.5 3.3*   CBG: No results for input(s): "GLUCAP" in the last 168 hours.  Discharge time spent: greater than 30 minutes.  Signed: Marguerita Merles, DO [facility] 06/03/2023

## 2023-06-02 NOTE — TOC Transition Note (Signed)
Transition of Care [facility]) - CM/SW Discharge Note   Patient Details  Name: SWEDEN HEINSOHN MRN: [id number] Date of Birth: 04/02/1971  Transition of Care [facility] CM/SW Contact:  Tom-Johnson, Hershal Coria, RN Phone Number: 06/02/2023, 12:57 PM   Clinical Narrative:     Patient is scheduled for discharge today.  Readmission Risk Assessment done. Outpatient f/u, hospital f/u and discharge instructions on AVS. No TOC needs or recommendations noted. Significant other, Duke Salvia to transport at discharge.  No further TOC needs noted.       Final next level of care: Home/Self Care Barriers to Discharge: Barriers Resolved   Patient Goals and CMS Choice CMS Medicare.gov Compare Post Acute Care list provided to:: Patient Choice offered to / list presented to : NA  Discharge Placement                  Patient to be transferred to facility by: Significant other Name of family member notified: [facility] Additional resources added to the After Visit Summary for     Discharge Planning Services: CM Consult            DME Arranged: N/A DME Agency: NA       HH Arranged: NA HH Agency: NA        Social Determinants of Health (SDOH) Interventions SDOH Screenings   Tobacco Use: High Risk (06/01/2023)     Readmission Risk Interventions    06/02/2023   12:56 PM  Readmission Risk Prevention Plan  Post Dischage Appt Complete  Medication Screening Complete  Transportation Screening Complete

## 2023-06-02 NOTE — Discharge Instructions (Signed)
:   Post-Operative Dressings Soft Dressings: If you do NOT have a splint or cast on, remove your surgical bandages in 4 days. At that time, you may start to shower or wash normally, but do not soak the wound in a tub, pool, dishwater, etc and do not scrub the incision. After washing, pat your incision dry and then apply a dry bandaid or gauze to cover.  If you have questions about your dressings, please call the clinic.  Post-operative Wound Care -In general: Any sutures or anything that will not fall off over time on their own, will be removed in clinic by our staff.  -Regardless of what is used to close your wound, do not submerge the wound in any standing water for at least 4 weeks after surgery. It is okay to let the shower water run over the wound and gently clean the wound with soapy water then rinse and gently pat dry.  -Do NOT put any hydrogen peroxide, alcohol, or any lotions, creams, ointments, or salves on the incision.  -If your surgical wound/incision was closed with non-absorbable sutures or staples, they will be removed at your first post-op visit.  -If your incision was closed with absorbable sutures, they do not need to be removed as they will dissolve on their own. They may be visible or buried underneath the skin.  You may have one of the following over your incision/wound. They can all get wet and should remain in place until they fall off on their own. -Small stickers called steri-strips. You do not need to remove them. If the ends start to peel up, you can carefully trim them with scissors. -Skin glue often known by the brand name "Dermabond". Do not remove or pick at it, it will fall off over time.

## 2023-06-04 ENCOUNTER — Inpatient Hospital Stay: Payer: Medicaid Other

## 2023-06-04 ENCOUNTER — Inpatient Hospital Stay: Payer: Medicaid Other | Attending: Oncology | Admitting: Oncology

## 2023-06-04 ENCOUNTER — Encounter: Payer: Self-pay | Admitting: *Deleted

## 2023-06-04 ENCOUNTER — Telehealth: Payer: Self-pay | Admitting: Oncology

## 2023-06-04 DIAGNOSIS — D72829 Elevated white blood cell count, unspecified: Secondary | ICD-10-CM | POA: Insufficient documentation

## 2023-06-04 DIAGNOSIS — C884 Extranodal marginal zone B-cell lymphoma of mucosa-associated lymphoid tissue [MALT-lymphoma]: Secondary | ICD-10-CM | POA: Insufficient documentation

## 2023-06-04 DIAGNOSIS — Z5112 Encounter for antineoplastic immunotherapy: Secondary | ICD-10-CM | POA: Insufficient documentation

## 2023-06-04 DIAGNOSIS — Z720 Tobacco use: Secondary | ICD-10-CM | POA: Insufficient documentation

## 2023-06-04 LAB — KAPPA/LAMBDA LIGHT CHAINS
Kappa free light chain: 18.3 mg/L (ref 3.3–19.4)
Kappa, lambda light chain ratio: 1.39 (ref 0.26–1.65)
Lambda free light chains: 13.2 mg/L (ref 5.7–26.3)

## 2023-06-04 MED ORDER — HYDROCODONE-ACETAMINOPHEN 5-325 MG PO TABS: 1.0000 | ORAL_TABLET | Freq: Four times a day (QID) | ORAL | 0 refills | Status: DC | PRN

## 2023-06-04 NOTE — Progress Notes (Signed)
[facility]   HEMATOLOGY- ONCOLOGY TeleHEALTH OFFICE VISIT PROGRESS NOTE  I connected with Nina Horn on 06/04/23 at 10:40 AM EDT by video and verified that I am speaking with the correct person using two identifiers.   I discussed the limitations, risks, security and privacy concerns of performing an evaluation and management service by telemedicine and the availability of in-person appointments. I also discussed with the patient that there may be a patient responsible charge related to this service. The patient expressed understanding and agreed to proceed.   Patient's location: Home Provider's location: Office   Diagnosis: MALT lymphoma  INTERVAL HISTORY:   Nina Horn was discharged from the hospital 06/02/2023.  She reports persistent nausea, but no emesis.  She is tolerating liquids.  She is having bowel movements.  She had the staples removed from her left hand earlier today.  She has abdominal "cramps ".  She takes Tylenol and hydrocodone as needed.   Lab Results:  Lab Results  Component Value Date   WBC 9.3 06/02/2023   HGB 11.9 (L) 06/02/2023   HCT 35.7 (L) 06/02/2023   MCV 91.5 06/02/2023   PLT 279 06/02/2023   NEUTROABS 8.1 (H) 06/01/2023     Medications: I have reviewed the patient's current medications.  Assessment/Plan: MALT lymphoma of the stomach 02/05/2023-EGD-2 nonbleeding gastric ulcers in the incisor and gastric antrum, largest measured 10 mm, congestion, discoloration, nodularity, ulceration, and inflammation in the gastric antrum.  Nodularity and congestion in the gastric body and lesser curvature, gastric cardia, fundus, duodenum appeared normal. Pathology from the gastric antrum and "esophagus "revealed benign ulcerative gastritis with an atypical lymphoid infiltrate H. pylori negative consistent with a MALT lymphoma, B lymphocytes CD20 positive, cyclin D1 negative, B-cell gene rearrangement positive 04/09/2023-EGD-no endoscopic  evidence of ulceration, post ulcer scarring, nodularity in the gastric body, prepyloric region and gastric antrum-biopsy revealed an atypical lymphoid infiltrate consistent with a lymphoid neoplasm CT abdomen/pelvis 05/25/2023-mild wall thickening at the lesser curvature of the distal gastric body, mild gastropathic ligament lymphadenopathy, soft tissue nodules in the gastrocolic ligament CT abdomen/pelvis 05/31/2023-unchanged distal gastric body wall thickening, gastrohepatic lymph nodes   2.  Nausea/vomiting and abdominal pain secondary to #1 3.  05/22/2023-left index finger ganglion cyst excision, left palmar mass excision-benign epidermal inclusion cyst 4.  Leukocytosis-likely secondary to tobacco use 5.  Tobacco use    Disposition: Nina Horn has been diagnosed with MALT lymphoma of the stomach.  I discussed the diagnosis, prognosis, and treatment options with her.  She will be scheduled for a staging PET prior to making a decision on treatment.  We will consider radiation if the lymphoma appears localized to the stomach.  I will recommend systemic therapy if the PET is consistent with more advanced stage disease.  We will decide on single agent rituximab versus Bendamustine/rituximab based on the extent of disease.  She will continue senna and MiraLAX.  I refilled her prescription for hydrocodone to use as needed for pain.  She will contact me if she is unable to tolerate liquids.  She does not feel the scopolamine patch has helped the nausea.  She will discontinue the scopolamine patch.  She will continue ondansetron as needed for nausea.  Nina Horn will be scheduled for an office visit within a few days of the staging PET scan.   I discussed the assessment and treatment plan with the patient. The patient was provided an opportunity to ask questions and all were answered. The patient agreed with the  plan and demonstrated an understanding of the instructions.   The patient was advised to  call back or seek an in-person evaluation if the symptoms worsen or if the condition fails to improve as anticipated.  I provided 25 minutes of video, chart review, and documentation time during this encounter, and > 50% was spent counseling as documented under my assessment & plan.  Thornton Papas MD  06/04/2023 9:37 AM

## 2023-06-04 NOTE — Telephone Encounter (Signed)
Spoke with patient confirming upcoming appointment  

## 2023-06-04 NOTE — Progress Notes (Signed)
Approval received from [facility] for hydrocodone-acetaminophen 5/325 mg tabs through 12/01/23.

## 2023-06-04 NOTE — Progress Notes (Signed)
PATIENT NAVIGATOR PROGRESS NOTE  Name: Nina Horn Date: 06/04/2023 MRN: [id number]  DOB: 04-11-1971   Reason for visit:  PET scheduling  Comments:  Pt scheduled for PET scan on 7/18 appt with Lonna Cobb NP moved from 7/12 to 7/18 for scan review and treatment plan discussion  Pt verbalized understanding and agreement    Time spent counseling/coordinating care: 30-45 minutes

## 2023-06-05 ENCOUNTER — Encounter (HOSPITAL_BASED_OUTPATIENT_CLINIC_OR_DEPARTMENT_OTHER): Payer: Self-pay

## 2023-06-05 ENCOUNTER — Other Ambulatory Visit: Payer: Self-pay

## 2023-06-05 DIAGNOSIS — K5903 Drug induced constipation: Secondary | ICD-10-CM | POA: Diagnosis present

## 2023-06-05 DIAGNOSIS — Z6821 Body mass index (BMI) 21.0-21.9, adult: Secondary | ICD-10-CM

## 2023-06-05 DIAGNOSIS — Z79899 Other long term (current) drug therapy: Secondary | ICD-10-CM

## 2023-06-05 DIAGNOSIS — K920 Hematemesis: Secondary | ICD-10-CM | POA: Diagnosis present

## 2023-06-05 DIAGNOSIS — R64 Cachexia: Secondary | ICD-10-CM | POA: Diagnosis present

## 2023-06-05 DIAGNOSIS — D72829 Elevated white blood cell count, unspecified: Secondary | ICD-10-CM | POA: Diagnosis present

## 2023-06-05 DIAGNOSIS — G893 Neoplasm related pain (acute) (chronic): Secondary | ICD-10-CM | POA: Diagnosis present

## 2023-06-05 DIAGNOSIS — T40605A Adverse effect of unspecified narcotics, initial encounter: Secondary | ICD-10-CM | POA: Diagnosis present

## 2023-06-05 DIAGNOSIS — Z888 Allergy status to other drugs, medicaments and biological substances status: Secondary | ICD-10-CM

## 2023-06-05 DIAGNOSIS — C884 Extranodal marginal zone B-cell lymphoma of mucosa-associated lymphoid tissue [MALT-lymphoma]: Principal | ICD-10-CM | POA: Diagnosis present

## 2023-06-05 DIAGNOSIS — F17210 Nicotine dependence, cigarettes, uncomplicated: Secondary | ICD-10-CM | POA: Diagnosis present

## 2023-06-05 DIAGNOSIS — F32A Depression, unspecified: Secondary | ICD-10-CM | POA: Diagnosis present

## 2023-06-05 LAB — CBC
HCT: 41.5 % (ref 36.0–46.0)
Hemoglobin: 14.1 g/dL (ref 12.0–15.0)
MCH: 30.6 pg (ref 26.0–34.0)
MCHC: 34 g/dL (ref 30.0–36.0)
MCV: 90 fL (ref 80.0–100.0)
Platelets: 318 10*3/uL (ref 150–400)
RBC: 4.61 MIL/uL (ref 3.87–5.11)
RDW: 13.2 % (ref 11.5–15.5)
WBC: 13.7 10*3/uL — ABNORMAL HIGH (ref 4.0–10.5)
nRBC: 0 % (ref 0.0–0.2)

## 2023-06-05 LAB — URINALYSIS, ROUTINE W REFLEX MICROSCOPIC
Bilirubin Urine: NEGATIVE
Glucose, UA: NEGATIVE mg/dL
Hgb urine dipstick: NEGATIVE
Leukocytes,Ua: NEGATIVE
Nitrite: NEGATIVE
Specific Gravity, Urine: 1.023 (ref 1.005–1.030)
pH: 6.5 (ref 5.0–8.0)

## 2023-06-05 LAB — PREGNANCY, URINE: Preg Test, Ur: NEGATIVE

## 2023-06-05 MED ORDER — ONDANSETRON HCL 4 MG/2ML IJ SOLN
4.0000 mg | Freq: Once | INTRAMUSCULAR | Status: AC | PRN
  Administered 2023-06-05: 4 mg via INTRAVENOUS
  Filled 2023-06-05: qty 2

## 2023-06-05 NOTE — ED Triage Notes (Signed)
Pt reports n/v symptoms today and unable to keep anything down getting worse over the last couple of days. Pt was d/c from the hospital last weekend. Hx of stomach cancer.   A/ox4; GCS 15.

## 2023-06-06 ENCOUNTER — Other Ambulatory Visit (HOSPITAL_COMMUNITY): Payer: Self-pay

## 2023-06-06 ENCOUNTER — Inpatient Hospital Stay (HOSPITAL_BASED_OUTPATIENT_CLINIC_OR_DEPARTMENT_OTHER)
Admission: EM | Admit: 2023-06-06 | Discharge: 2023-06-08 | DRG: 841 | Disposition: A | Discharge status: Home / Self Care | Payer: Medicaid Other | Attending: Internal Medicine | Admitting: Internal Medicine

## 2023-06-06 ENCOUNTER — Other Ambulatory Visit: Payer: Self-pay

## 2023-06-06 DIAGNOSIS — C884 Extranodal marginal zone B-cell lymphoma of mucosa-associated lymphoid tissue [MALT-lymphoma]: Secondary | ICD-10-CM | POA: Insufficient documentation

## 2023-06-06 DIAGNOSIS — R112 Nausea with vomiting, unspecified: Secondary | ICD-10-CM | POA: Diagnosis present

## 2023-06-06 DIAGNOSIS — T40605A Adverse effect of unspecified narcotics, initial encounter: Secondary | ICD-10-CM | POA: Diagnosis not present

## 2023-06-06 DIAGNOSIS — Z79899 Other long term (current) drug therapy: Secondary | ICD-10-CM | POA: Diagnosis not present

## 2023-06-06 DIAGNOSIS — F32A Depression, unspecified: Secondary | ICD-10-CM | POA: Diagnosis not present

## 2023-06-06 DIAGNOSIS — R64 Cachexia: Secondary | ICD-10-CM | POA: Diagnosis not present

## 2023-06-06 DIAGNOSIS — Z85028 Personal history of other malignant neoplasm of stomach: Secondary | ICD-10-CM

## 2023-06-06 DIAGNOSIS — D72829 Elevated white blood cell count, unspecified: Secondary | ICD-10-CM | POA: Diagnosis not present

## 2023-06-06 DIAGNOSIS — K5903 Drug induced constipation: Secondary | ICD-10-CM | POA: Diagnosis not present

## 2023-06-06 DIAGNOSIS — G893 Neoplasm related pain (acute) (chronic): Secondary | ICD-10-CM | POA: Diagnosis not present

## 2023-06-06 DIAGNOSIS — Z6821 Body mass index (BMI) 21.0-21.9, adult: Secondary | ICD-10-CM | POA: Diagnosis not present

## 2023-06-06 DIAGNOSIS — K920 Hematemesis: Principal | ICD-10-CM

## 2023-06-06 DIAGNOSIS — Z888 Allergy status to other drugs, medicaments and biological substances status: Secondary | ICD-10-CM | POA: Diagnosis not present

## 2023-06-06 DIAGNOSIS — F17210 Nicotine dependence, cigarettes, uncomplicated: Secondary | ICD-10-CM | POA: Diagnosis not present

## 2023-06-06 LAB — MULTIPLE MYELOMA PANEL, SERUM
Albumin SerPl Elph-Mcnc: 2.8 g/dL — ABNORMAL LOW (ref 2.9–4.4)
Albumin/Glob SerPl: 0.9 (ref 0.7–1.7)
Alpha 1: 0.1 g/dL (ref 0.0–0.4)
Alpha2 Glob SerPl Elph-Mcnc: 0.4 g/dL (ref 0.4–1.0)
B-Globulin SerPl Elph-Mcnc: 2.1 g/dL — ABNORMAL HIGH (ref 0.7–1.3)
Gamma Glob SerPl Elph-Mcnc: 0.7 g/dL (ref 0.4–1.8)
Globulin, Total: 3.4 g/dL (ref 2.2–3.9)
IgA: 129 mg/dL (ref 87–352)
IgG (Immunoglobin G), Serum: 791 mg/dL (ref 586–1602)
IgM (Immunoglobulin M), Srm: 62 mg/dL (ref 26–217)
Total Protein ELP: 6.2 g/dL (ref 6.0–8.5)

## 2023-06-06 LAB — CBC WITH DIFFERENTIAL/PLATELET
Abs Immature Granulocytes: 0.04 10*3/uL (ref 0.00–0.07)
Basophils Absolute: 0 10*3/uL (ref 0.0–0.1)
Basophils Relative: 0 %
Eosinophils Absolute: 0.1 10*3/uL (ref 0.0–0.5)
Eosinophils Relative: 0 %
HCT: 36.7 % (ref 36.0–46.0)
Hemoglobin: 12 g/dL (ref 12.0–15.0)
Immature Granulocytes: 0 %
Lymphocytes Relative: 12 %
Lymphs Abs: 1.5 10*3/uL (ref 0.7–4.0)
MCH: 30.3 pg (ref 26.0–34.0)
MCHC: 32.7 g/dL (ref 30.0–36.0)
MCV: 92.7 fL (ref 80.0–100.0)
Monocytes Absolute: 0.7 10*3/uL (ref 0.1–1.0)
Monocytes Relative: 6 %
Neutro Abs: 10.2 10*3/uL — ABNORMAL HIGH (ref 1.7–7.7)
Neutrophils Relative %: 82 %
Platelets: 294 10*3/uL (ref 150–400)
RBC: 3.96 MIL/uL (ref 3.87–5.11)
RDW: 13.2 % (ref 11.5–15.5)
WBC: 12.5 10*3/uL — ABNORMAL HIGH (ref 4.0–10.5)
nRBC: 0 % (ref 0.0–0.2)

## 2023-06-06 LAB — HEMOGLOBIN AND HEMATOCRIT, BLOOD
HCT: 35.1 % — ABNORMAL LOW (ref 36.0–46.0)
HCT: 36.8 % (ref 36.0–46.0)
Hemoglobin: 11.7 g/dL — ABNORMAL LOW (ref 12.0–15.0)
Hemoglobin: 12 g/dL (ref 12.0–15.0)

## 2023-06-06 LAB — COMPREHENSIVE METABOLIC PANEL
ALT: 8 U/L (ref 0–44)
ALT: 10 U/L (ref 0–44)
AST: 17 U/L (ref 15–41)
AST: 13 U/L — ABNORMAL LOW (ref 15–41)
Albumin: 4.7 g/dL (ref 3.5–5.0)
Albumin: 3.6 g/dL (ref 3.5–5.0)
Alkaline Phosphatase: 72 U/L (ref 38–126)
Alkaline Phosphatase: 72 U/L (ref 38–126)
Anion gap: 13 (ref 5–15)
Anion gap: 11 (ref 5–15)
BUN: 7 mg/dL (ref 6–20)
BUN: 8 mg/dL (ref 6–20)
CO2: 27 mmol/L (ref 22–32)
CO2: 25 mmol/L (ref 22–32)
Calcium: 10.4 mg/dL — ABNORMAL HIGH (ref 8.9–10.3)
Calcium: 8.9 mg/dL (ref 8.9–10.3)
Chloride: 99 mmol/L (ref 98–111)
Chloride: 103 mmol/L (ref 98–111)
Creatinine, Ser: 0.9 mg/dL (ref 0.44–1.00)
Creatinine, Ser: 0.76 mg/dL (ref 0.44–1.00)
GFR, Estimated: >60 mL/min (ref >60)
GFR, Estimated: >60 mL/min (ref >60)
Glucose, Bld: 96 mg/dL (ref 70–99)
Glucose, Bld: 106 mg/dL — ABNORMAL HIGH (ref 70–99)
Potassium: 4 mmol/L (ref 3.5–5.1)
Potassium: 3.5 mmol/L (ref 3.5–5.1)
Sodium: 139 mmol/L (ref 135–145)
Sodium: 139 mmol/L (ref 135–145)
Total Bilirubin: 0.4 mg/dL (ref 0.3–1.2)
Total Bilirubin: 0.5 mg/dL (ref 0.3–1.2)
Total Protein: 8 g/dL (ref 6.5–8.1)
Total Protein: 6.6 g/dL (ref 6.5–8.1)

## 2023-06-06 LAB — MAGNESIUM: Magnesium: 2.1 mg/dL (ref 1.7–2.4)

## 2023-06-06 LAB — LIPASE, BLOOD: Lipase: 33 U/L (ref 11–51)

## 2023-06-06 MED ORDER — LACTATED RINGERS IV SOLN: INTRAVENOUS | Status: AC

## 2023-06-06 MED ORDER — PANTOPRAZOLE SODIUM 40 MG IV SOLR: 40.0000 mg | INTRAVENOUS | Status: DC

## 2023-06-06 MED ORDER — PANTOPRAZOLE SODIUM 40 MG IV SOLR
40.0000 mg | Freq: Two times a day (BID) | INTRAVENOUS | Status: DC
  Administered 2023-06-06 – 2023-06-07 (×2): 40 mg via INTRAVENOUS
  Filled 2023-06-06 (×2): qty 10

## 2023-06-06 MED ORDER — SENNA 8.6 MG PO TABS
1.0000 | ORAL_TABLET | Freq: Two times a day (BID) | ORAL | Status: DC
  Administered 2023-06-06 – 2023-06-08 (×4): 8.6 mg via ORAL
  Filled 2023-06-06 (×4): qty 1

## 2023-06-06 MED ORDER — SCOPOLAMINE 1 MG/3DAYS TD PT72
1.0000 | MEDICATED_PATCH | TRANSDERMAL | Status: DC
  Administered 2023-06-06: 1.5 mg via TRANSDERMAL
  Filled 2023-06-06: qty 1

## 2023-06-06 MED ORDER — PANTOPRAZOLE SODIUM 40 MG IV SOLR
40.0000 mg | Freq: Once | INTRAVENOUS | Status: AC
  Administered 2023-06-06: 40 mg via INTRAVENOUS
  Filled 2023-06-06: qty 10

## 2023-06-06 MED ORDER — HYDROCODONE-ACETAMINOPHEN 5-325 MG PO TABS: 1.0000 | ORAL_TABLET | Freq: Four times a day (QID) | ORAL | Status: DC | PRN

## 2023-06-06 MED ORDER — ONDANSETRON HCL 4 MG/2ML IJ SOLN
4.0000 mg | Freq: Four times a day (QID) | INTRAMUSCULAR | Status: DC | PRN
  Administered 2023-06-06 – 2023-06-08 (×6): 4 mg via INTRAVENOUS
  Filled 2023-06-06 (×5): qty 2

## 2023-06-06 MED ORDER — SODIUM CHLORIDE 0.9 % IV BOLUS
1000.0000 mL | Freq: Once | INTRAVENOUS | Status: AC
  Administered 2023-06-06: 1000 mL via INTRAVENOUS

## 2023-06-06 MED ORDER — PROCHLORPERAZINE EDISYLATE 10 MG/2ML IJ SOLN
10.0000 mg | Freq: Once | INTRAMUSCULAR | Status: AC
  Administered 2023-06-06: 10 mg via INTRAVENOUS
  Filled 2023-06-06: qty 2

## 2023-06-06 MED ORDER — FENTANYL CITRATE PF 50 MCG/ML IJ SOSY: 25.0000 ug | PREFILLED_SYRINGE | INTRAMUSCULAR | Status: DC | PRN

## 2023-06-06 MED ORDER — SUCRALFATE 1 G PO TABS
1.0000 g | ORAL_TABLET | Freq: Three times a day (TID) | ORAL | Status: DC
  Administered 2023-06-06 – 2023-06-08 (×5): 1 g via ORAL
  Filled 2023-06-06 (×5): qty 1

## 2023-06-06 MED ORDER — HYDROMORPHONE HCL 1 MG/ML IJ SOLN
1.0000 mg | Freq: Once | INTRAMUSCULAR | Status: AC
  Administered 2023-06-06: 1 mg via INTRAVENOUS
  Filled 2023-06-06: qty 1

## 2023-06-06 MED ORDER — BOOST / RESOURCE BREEZE PO LIQD CUSTOM
1.0000 | Freq: Three times a day (TID) | ORAL | Status: DC
  Administered 2023-06-07 – 2023-06-08 (×2): 1 via ORAL

## 2023-06-06 MED ORDER — SENNOSIDES 8.8 MG/5ML PO SYRP
10.0000 mL | ORAL_SOLUTION | Freq: Two times a day (BID) | ORAL | Status: DC
  Filled 2023-06-06: qty 10

## 2023-06-06 MED ORDER — ACETAMINOPHEN 325 MG PO TABS
650.0000 mg | ORAL_TABLET | Freq: Four times a day (QID) | ORAL | Status: DC | PRN
  Administered 2023-06-07 (×2): 650 mg via ORAL
  Filled 2023-06-06 (×2): qty 2

## 2023-06-06 MED ORDER — HYDROMORPHONE HCL 1 MG/ML IJ SOLN
1.0000 mg | INTRAMUSCULAR | Status: DC | PRN
  Administered 2023-06-06 – 2023-06-07 (×3): 1 mg via INTRAVENOUS
  Filled 2023-06-06 (×3): qty 1

## 2023-06-06 MED ORDER — POLYETHYLENE GLYCOL 3350 17 G PO PACK: 17.0000 g | PACK | Freq: Every day | ORAL | Status: DC | PRN

## 2023-06-06 MED ORDER — ACETAMINOPHEN 650 MG RE SUPP: 650.0000 mg | Freq: Four times a day (QID) | RECTAL | Status: DC | PRN

## 2023-06-06 MED ORDER — ONDANSETRON HCL 4 MG/2ML IJ SOLN
4.0000 mg | Freq: Once | INTRAMUSCULAR | Status: AC
  Administered 2023-06-06: 4 mg via INTRAVENOUS
  Filled 2023-06-06: qty 2

## 2023-06-06 MED ORDER — ENSURE ENLIVE PO LIQD
237.0000 mL | Freq: Two times a day (BID) | ORAL | Status: DC
  Administered 2023-06-07 – 2023-06-08 (×3): 237 mL via ORAL

## 2023-06-06 MED ORDER — POLYETHYLENE GLYCOL 3350 17 G PO PACK
17.0000 g | PACK | Freq: Two times a day (BID) | ORAL | Status: DC
  Administered 2023-06-06 – 2023-06-08 (×5): 17 g via ORAL
  Filled 2023-06-06 (×5): qty 1

## 2023-06-06 MED ORDER — NALOXONE HCL 0.4 MG/ML IJ SOLN: 0.4000 mg | INTRAMUSCULAR | Status: DC | PRN

## 2023-06-06 NOTE — TOC Benefit Eligibility Note (Signed)
Pharmacy Patient Advocate Encounter  Insurance verification completed.    The patient is insured through [facility]   Ran test claim for scopolamine (Transderm-Scop) 1 mg/3 days and the current 30 day co-pay is $4.00.   This test claim was processed through [facility]- copay amounts may vary at other pharmacies due to pharmacy/plan contracts, or as the patient moves through the different stages of their insurance plan.    Roland Earl, CPHT Pharmacy Patient Advocate Specialist [facility] Patient Advocate Team Direct Number: [phone]  Fax: [phone]

## 2023-06-06 NOTE — Consult Note (Signed)
[facility] Consultation Note  Referring Provider: [facility] Primary Care Physician:  Norm Salt, PA Primary Gastroenterologist:  Dr. Levora Angel  Reason for Consultation:  nausea and vomiting  HPI: Nina Horn is a 52 y.o. female presenting recurrent nausea and vomiting.  Ongoing problem. Recent diagnosis for the same.  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  Having abdominal discomfort and nausea and weight loss.  Saw Dr. Truett Perna last week, but to my knowledge no treatment has yet been initiated.  Report of some coffee ground emesis.  No melena or hematochezia.   Past Medical History:  Diagnosis Date   Depression    Migraines     Past Surgical History:  Procedure Laterality Date   PILONIDAL CYST EXCISION     WISDOM TOOTH EXTRACTION      Prior to Admission medications   Medication Sig Start Date End Date Taking? Authorizing Provider  acetaminophen (TYLENOL) 325 MG tablet Take 2 tablets (650 mg total) by mouth every 6 (six) hours as needed for mild pain, fever or headache. 06/02/23  Yes Sheikh, Omair Latif, DO  HYDROcodone-acetaminophen (NORCO/VICODIN) 5-325 MG tablet Take 1 tablet by mouth every 6 (six) hours as needed for moderate pain. 06/04/23  Yes Ladene Artist, MD  ondansetron (ZOFRAN) 4 MG tablet Take 4 mg by mouth every 8 (eight) hours as needed for vomiting or nausea. 01/24/23  Yes [provider]  polyethylene glycol (MIRALAX / GLYCOLAX) 17 g packet Take 17 g by mouth daily as needed for mild constipation. 06/02/23  Yes Sheikh, Omair Latif, DO  scopolamine (TRANSDERM-SCOP) 1 MG/3DAYS Place 1 patch (1.5 mg total) onto the skin every 3 (three) days. Patient taking differently: Place 1 patch onto the skin every 3 (three) days. 06/04/23  Yes Sheikh, Omair Latif, DO  senna-docusate (SENOKOT-S) 8.6-50 MG tablet Take 1 tablet by mouth at bedtime. 06/02/23  Yes Sheikh, Omair Latif, DO   sucralfate (CARAFATE) 1 g tablet Take 1 tablet (1 g total) by mouth 4 (four) times daily -  with meals and at bedtime. 04/05/20  Yes Nanavati, Ankit, MD  pantoprazole (PROTONIX) 40 MG tablet Take 1 tablet (40 mg total) by mouth daily. Patient not taking: Reported on 06/06/2023 04/05/20   Derwood Kaplan, MD  ranitidine (ZANTAC) 150 MG tablet Take 1 tablet (150 mg total) by mouth 2 (two) times daily. Patient not taking: Reported on 10/13/2018 07/27/18 04/05/20  Rigoberto Noel, MD    Current Facility-Administered Medications  Medication Dose Route Frequency Provider Last Rate Last Admin   acetaminophen (TYLENOL) tablet 650 mg  650 mg Oral Q6H PRN Howerter, Justin B, DO       Or   acetaminophen (TYLENOL) suppository 650 mg  650 mg Rectal Q6H PRN Howerter, Justin B, DO       fentaNYL (SUBLIMAZE) injection 25 mcg  25 mcg Intravenous Q2H PRN Howerter, Justin B, DO       HYDROmorphone (DILAUDID) injection 1 mg  1 mg Intravenous Q3H PRN Kirby Crigler, Mir M, MD   1 mg at 06/06/23 1027   lactated ringers infusion   Intravenous Continuous Howerter, Justin B, DO 75 mL/hr at 06/06/23 0556 New Bag at 06/06/23 0556   naloxone (NARCAN) injection 0.4 mg  0.4 mg Intravenous PRN Howerter, Justin B, DO       ondansetron (ZOFRAN) injection 4 mg  4 mg Intravenous Q6H PRN Howerter, Justin B, DO   4 mg at 06/06/23 1040   pantoprazole (  PROTONIX) injection 40 mg  40 mg Intravenous Q12H Kirby Crigler, Mir M, MD       polyethylene glycol (MIRALAX / GLYCOLAX) packet 17 g  17 g Oral BID Kirby Crigler, Mir M, MD   17 g at 06/06/23 1028   scopolamine (TRANSDERM-SCOP) 1 MG/3DAYS 1.5 mg  1 patch Transdermal Q72H Kirby Crigler, Mir M, MD   1.5 mg at 06/06/23 1029   senna (SENOKOT) tablet 8.6 mg  1 tablet Oral BID Herby Abraham, RPH   8.6 mg at 06/06/23 1028   sucralfate (CARAFATE) tablet 1 g  1 g Oral TID WC & HS Maryln Gottron, MD        Allergies as of 06/05/2023 - Review Complete 06/05/2023  Allergen Reaction Noted   Promethazine hcl  Other (See Comments) 10/25/2009    Family History  Problem Relation Age of Onset   Skeletal dysplasia Daughter        Thanatophoric dysplasia    Social History   Socioeconomic History   Marital status: Single    Spouse name: Not on file   Number of children: Not on file   Years of education: Not on file   Highest education level: Not on file  Occupational History   Not on file  Tobacco Use   Smoking status: Every Day    Packs/day: 1    Types: Cigarettes   Smokeless tobacco: Never  Substance and Sexual Activity   Alcohol use: No   Drug use: Yes    Types: Marijuana   Sexual activity: Yes    Birth control/protection: None  Other Topics Concern   Not on file  Social History Narrative   Not on file   Social Determinants of Health   Financial Resource Strain: Not on file  Food Insecurity: No Food Insecurity (06/06/2023)   Hunger Vital Sign    Worried About Running Out of Food in the Last Year: Never true    Ran Out of Food in the Last Year: Never true  Transportation Needs: No Transportation Needs (06/06/2023)   PRAPARE - Administrator, Civil Service (Medical): No    Lack of Transportation (Non-Medical): No  Physical Activity: Not on file  Stress: Not on file  Social Connections: Not on file  Intimate Partner Violence: Not At Risk (06/06/2023)   Humiliation, Afraid, Rape, and Kick questionnaire    Fear of Current or Ex-Partner: No    Emotionally Abused: No    Physically Abused: No    Sexually Abused: No    Review of Systems: As per HPI, all others negative  Physical Exam: Vital signs in last 24 hours: Temp:  [97.5 F (36.4 C)-98.2 F (36.8 C)] 97.5 F (36.4 C) (07/03 0932) Pulse Rate:  [52-85] 57 (07/03 0932) Resp:  [17-18] 18 (07/03 0932) BP: (109-138)/(64-86) 120/73 (07/03 0932) SpO2:  [96 %-100 %] 100 % (07/03 0932) Weight:  [54 kg] 54 kg (07/02 2301) Last BM Date : 06/02/23 General:   Alert, thin, cachectic, cooperative in NAD Head:   Normocephalic and atraumatic. Eyes:  Sclera clear, no icterus.   Conjunctiva pink. Ears:  Normal auditory acuity. Nose:  No deformity, discharge,  or lesions. Mouth:  No deformity or lesions.  Oropharynx pink but dry Neck:  Supple; no masses or thyromegaly. Lungs:  No respiratory distress Abdomen:  Soft, nontender, no significant distention. No masses, hepatosplenomegaly or hernias noted. Without guarding, and without rebound.     Msk:  Symmetrical without gross deformities. Normal posture. Pulses:  Normal pulses noted. Extremities:  Without clubbing or edema. Neurologic:  Alert and  oriented x4; diffusely weak, otherwise grossly normal neurologically. Skin:  Intact without significant lesions or rashes. Psych:  Alert and cooperative. Normal mood and affect.   Lab Results: Recent Labs    06/05/23 2304 06/06/23 0545 06/06/23 0930  WBC 13.7* 12.5*  --   HGB 14.1 12.0 11.7*  HCT 41.5 36.7 35.1*  PLT 318 294  --    BMET Recent Labs    06/05/23 2304 06/06/23 0545  NA 139 139  K 4.0 3.5  CL 99 103  CO2 27 25  GLUCOSE 96 106*  BUN 7 8  CREATININE 0.90 0.76  CALCIUM 10.4* 8.9   LFT Recent Labs    06/06/23 0545  PROT 6.6  ALBUMIN 3.6  AST 13*  ALT 10  ALKPHOS 72  BILITOT 0.5   PT/INR No results for input(s): "LABPROT", "INR" in the last 72 hours.  Studies/Results: No results found.  Impression:   Nausea, vomiting.  Recurrent. Coffee ground emesis? Newly diagnosed gastric ulcer (MALT-lymphoma, H. Pylori positive). Weight loss.  Plan:   Per my review of notes, there is disconnect between patient's understanding of her diagnosis and subsequent instructions that have been given her, specifically as regards to follow-up with oncology appointments and scans on certain dates and having to limit her diet in light of her diagnosis.   Clear liquid diet for now. Antiemetics + PPI. Inpatient oncology consult. I do not think repeat endoscopy is necessary in absence of  acute destabilizing rampant GI bleeding, which patient currently does not have. No further recommendations from GI perspective; [facility] will sign-off; case reviewed with hospitalist team.   LOS: 0 days   Kita Neace M  06/06/2023, 12:19 PM  Cell [phone] If no answer or after 5 PM call [phone]

## 2023-06-06 NOTE — ED Provider Notes (Signed)
[place] EMERGENCY DEPARTMENT AT [facility] Provider Note   CSN: [id number] Arrival date & time: 06/05/23  2253     History  Chief Complaint  Patient presents with   Nausea   Emesis    Nina Horn is a 52 y.o. female.  Patient is a 52 year old female with recently diagnosed gastric cancer.  She was just discharged 1 week ago after being admitted for vomiting and abdominal pain.  She has yet to undergo treatment, but is being followed by Dr. Myrle Sheng.  Patient presenting today unable to keep anything down.  She denies any fevers or chills.  She denies any ill contacts or having consumed any suspicious foods.  The history is provided by the patient.       Home Medications Prior to Admission medications   Medication Sig Start Date End Date Taking? Authorizing Provider  acetaminophen (TYLENOL) 325 MG tablet Take 2 tablets (650 mg total) by mouth every 6 (six) hours as needed for mild pain, fever or headache. 06/02/23   Marguerita Merles Latif, DO  HYDROcodone-acetaminophen (NORCO/VICODIN) 5-325 MG tablet Take 1 tablet by mouth every 6 (six) hours as needed for moderate pain. 06/04/23   Ladene Artist, MD  ondansetron (ZOFRAN) 4 MG tablet Take 4 mg by mouth every 8 (eight) hours as needed for vomiting or nausea. 01/24/23   [provider]  pantoprazole (PROTONIX) 40 MG tablet Take 1 tablet (40 mg total) by mouth daily. 04/05/20   Derwood Kaplan, MD  polyethylene glycol (MIRALAX / GLYCOLAX) 17 g packet Take 17 g by mouth daily as needed for mild constipation. 06/02/23   Marguerita Merles Latif, DO  scopolamine (TRANSDERM-SCOP) 1 MG/3DAYS Place 1 patch (1.5 mg total) onto the skin every 3 (three) days. 06/04/23   Sheikh, Omair Latif, DO  senna-docusate (SENOKOT-S) 8.6-50 MG tablet Take 1 tablet by mouth at bedtime. 06/02/23   Marguerita Merles Latif, DO  sucralfate (CARAFATE) 1 g tablet Take 1 tablet (1 g total) by mouth 4 (four) times daily -  with meals and at bedtime. 04/05/20    Derwood Kaplan, MD  ranitidine (ZANTAC) 150 MG tablet Take 1 tablet (150 mg total) by mouth 2 (two) times daily. Patient not taking: Reported on 10/13/2018 07/27/18 04/05/20  Rigoberto Noel, MD      Allergies    Promethazine hcl    Review of Systems   Review of Systems  All other systems reviewed and are negative.   Physical Exam Updated Vital Signs BP 114/76   Pulse 66   Temp 98.2 F (36.8 C) (Oral)   Resp 17   Ht 5\' 3"  (1.6 m)   Wt 54 kg   SpO2 97%   BMI 21.08 kg/m  Physical Exam Vitals and nursing note reviewed.  Constitutional:      General: She is not in acute distress.    Appearance: She is well-developed. She is not diaphoretic.  HENT:     Head: Normocephalic and atraumatic.  Cardiovascular:     Rate and Rhythm: Normal rate and regular rhythm.     Heart sounds: No murmur heard.    No friction rub. No gallop.  Pulmonary:     Effort: Pulmonary effort is normal. No respiratory distress.     Breath sounds: Normal breath sounds. No wheezing.  Abdominal:     General: Bowel sounds are normal. There is no distension.     Palpations: Abdomen is soft.     Tenderness: There is abdominal tenderness. There is  no guarding or rebound.     Comments: There is tenderness to palpation in the epigastric region.  Musculoskeletal:        General: Normal range of motion.     Cervical back: Normal range of motion and neck supple.  Skin:    General: Skin is warm and dry.  Neurological:     General: No focal deficit present.     Mental Status: She is alert and oriented to person, place, and time.     ED Results / Procedures / Treatments   Labs (all labs ordered are listed, but only abnormal results are displayed) Labs Reviewed  COMPREHENSIVE METABOLIC PANEL - Abnormal; Notable for the following components:      Result Value   Calcium 10.4 (*)    All other components within normal limits  CBC - Abnormal; Notable for the following components:   WBC 13.7 (*)    All other  components within normal limits  URINALYSIS, ROUTINE W REFLEX MICROSCOPIC - Abnormal; Notable for the following components:   Ketones, ur TRACE (*)    Protein, ur TRACE (*)    All other components within normal limits  LIPASE, BLOOD  PREGNANCY, URINE    EKG None  Radiology No results found.  Procedures Procedures    Medications Ordered in ED Medications  ondansetron (ZOFRAN) injection 4 mg (4 mg Intravenous Given 06/05/23 2358)  sodium chloride 0.9 % bolus 1,000 mL (1,000 mLs Intravenous New Bag/Given 06/06/23 0225)  ondansetron (ZOFRAN) injection 4 mg (4 mg Intravenous Given 06/06/23 0227)  HYDROmorphone (DILAUDID) injection 1 mg (1 mg Intravenous Given 06/06/23 0226)  prochlorperazine (COMPAZINE) injection 10 mg (10 mg Intravenous Given 06/06/23 0255)    ED Course/ Medical Decision Making/ A&P  Patient is a 52 year old female with recently diagnosed gastric cancer presenting with complaints of abdominal pain, vomiting, and hematemesis.  She was recently admitted for the same.  Patient arrives here with stable vital signs and is afebrile.  Physical examination reveals tenderness in the epigastric region with no rebound or guarding.  Workup initiated including CBC, CMP, and lipase.  She does have a leukocytosis with white count of 13.7 but laboratory studies are otherwise unremarkable.  Patient was hydrated with normal saline and treated with Dilaudid for pain and Zofran for nausea.  She is feeling somewhat better, but due to the vomiting of blood, I feels the patient should be admitted for observation.  I have spoken with Dr. Antionette Char who agrees to admit.  Final Clinical Impression(s) / ED Diagnoses Final diagnoses:  None    Rx / DC Orders ED Discharge Orders     None         Geoffery Lyons, MD 06/06/23 [phone]

## 2023-06-06 NOTE — Progress Notes (Signed)
Plan of Care Note for accepted transfer   Patient: Nina Horn MRN: [id number]   DOA: 06/06/2023  Facility requesting transfer: [facility]   Requesting Provider: Dr. Judd Lien   Reason for transfer: Intractable N/V, hematemesis   Facility course: 52 yr old female with recently diagnosed MALT lymphoma p/w N/V since yesterday, unable to keep anything down.   She has dark emesis in ED that appears bloody per ED report.   WBC is 13.7 and calcium 10.4. CMP and CBC otherwise normal.   She was given 3 doses of antiemetics, 1 liter IVF, and Dilaudid in ED and will be getting IV PPI.   Plan of care: The patient is accepted for admission to Telemetry unit, at [facility].   Author: Briscoe Deutscher, MD 06/06/2023  Check [email or web address] for on-call coverage.  Nursing staff, Please call [facility] Admits & Consults System-Wide number on Amion as soon as patient's arrival, so appropriate admitting provider can evaluate the pt.

## 2023-06-06 NOTE — ED Notes (Signed)
Patient reports vomiting blood since being here tonight.

## 2023-06-06 NOTE — Progress Notes (Signed)
Carryover admission to the Day Admitter; accepted by Dr.  Antionette Char as transfer from  [facility]  to a  med-tele bed at  [facility]  for intractable nausea/vomiting. Please see Dr.  Francesco Runner transfer progress note for additional details.    I have placed some additional preliminary admit orders via the adult multi-morbid admission order set. I have also ordered prn IV Zofran, as needed IV fentanyl for abdominal discomfort, and continues lactated Ringer's at 75 cc/h x 12 hours.  Of also ordered updated morning labs in the form of CMP, CBC and magnesium level.    Newton Pigg, DO Hospitalist

## 2023-06-06 NOTE — ED Notes (Signed)
Patient heard by nurse at nurse's station wretching, vomiting, crying. Nurse enters room to find patient vomiting a dark liquid. Patient reports she has not eaten all day. Dr. Judd Lien notified.

## 2023-06-06 NOTE — H&P (Addendum)
History and Physical  [name] BMW:[id number] DOB: Jun 12, 1971 DOA: 06/06/2023  PCP: Norm Salt, PA   Chief Complaint: vomiting, abdominal pain   HPI: Nina Horn is a 52 y.o. female with medical history significant for tobacco abuse disorder, newly diagnosed gastric MALT lymphoma being admitted to the hospital with recurrent intractable nausea and vomiting.  She was admitted for for the same to [facility] 6/27 to 06/03/2023 and was discharged home on 6/30 when she was tolerating a diet.  Plan was for her to follow-up with her oncologist Dr. Alcide Evener on Monday, 06/04/2023, as well as with gastroenterology as an outpatient.  Patient states that her nausea was indeed improved in the hospital, but since going home she almost immediately started having intractable nausea with vomiting once again.  She had a couple of small bowel movements in the hospital, felt to be constipated due to narcotics being taken for her recent left hand surgery.  Since being discharged from the hospital, she has been taking MiraLAX once a day, but has not had any bowel movements.  She continues to have intermittent severe crampy abdominal pain rated 9 out of 10, especially in the left upper quadrant.  She has had pretty severe nausea, really unable to keep anything down.  She has also been vomiting since she left the hospital despite taking her disintegrating Zofran tablets.  She had scopolamine patch placed in the hospital, took it off on 7/1 since that had been on for several days.  States that about 3 days ago when she was vomiting, there was bright red material, no coffee grounds.  It has now turned more dark but is still a dark red color and she is worried that she is vomiting blood.  She denies any chest pain, fevers.  She had a follow-up with her hand surgeon on 7/1, 3 stitches were removed and per the patient her hand is healing well.  She also had a virtual visit with her oncologist Dr. Alcide Evener,  who per the patient recommends outpatient PET scan prior to starting therapy for her gastric cancer.  The PET scan is scheduled on 7/18.  Due to continued intractable nausea and vomiting, and concern for possible hematemesis, the patient presented to the ER overnight.  ER course: Patient has been hemodynamically stable, lab work was done and reveals mild stable leukocytosis, AST 13 but otherwise normal labs.  She was given IV PPI, IV fluids, and admitted to the hospitalist service.  Review of Systems: Please see HPI for pertinent positives and negatives. A complete 10 system review of systems are otherwise negative.  Past Medical History:  Diagnosis Date   Depression    Migraines    Past Surgical History:  Procedure Laterality Date   PILONIDAL CYST EXCISION     WISDOM TOOTH EXTRACTION      Social History:  reports that she has been smoking cigarettes. She has been smoking an average of 1 pack per day. She has never used smokeless tobacco. She reports current drug use. Drug: Marijuana. She reports that she does not drink alcohol.   Allergies  Allergen Reactions   Promethazine Hcl Other (See Comments)    delirium    Family History  Problem Relation Age of Onset   Skeletal dysplasia Daughter        Thanatophoric dysplasia     Prior to Admission medications   Medication Sig Start Date End Date Taking? Authorizing Provider  acetaminophen (TYLENOL) 325 MG tablet Take 2  tablets (650 mg total) by mouth every 6 (six) hours as needed for mild pain, fever or headache. 06/02/23   Marguerita Merles Latif, DO  HYDROcodone-acetaminophen (NORCO/VICODIN) 5-325 MG tablet Take 1 tablet by mouth every 6 (six) hours as needed for moderate pain. 06/04/23   Ladene Artist, MD  ondansetron (ZOFRAN) 4 MG tablet Take 4 mg by mouth every 8 (eight) hours as needed for vomiting or nausea. 01/24/23   [provider]  pantoprazole (PROTONIX) 40 MG tablet Take 1 tablet (40 mg total) by mouth daily. 04/05/20    Derwood Kaplan, MD  polyethylene glycol (MIRALAX / GLYCOLAX) 17 g packet Take 17 g by mouth daily as needed for mild constipation. 06/02/23   Marguerita Merles Latif, DO  scopolamine (TRANSDERM-SCOP) 1 MG/3DAYS Place 1 patch (1.5 mg total) onto the skin every 3 (three) days. 06/04/23   Sheikh, Omair Latif, DO  senna-docusate (SENOKOT-S) 8.6-50 MG tablet Take 1 tablet by mouth at bedtime. 06/02/23   Marguerita Merles Latif, DO  sucralfate (CARAFATE) 1 g tablet Take 1 tablet (1 g total) by mouth 4 (four) times daily -  with meals and at bedtime. 04/05/20   Derwood Kaplan, MD  ranitidine (ZANTAC) 150 MG tablet Take 1 tablet (150 mg total) by mouth 2 (two) times daily. Patient not taking: Reported on 10/13/2018 07/27/18 04/05/20  Rigoberto Noel, MD    Physical Exam: BP 116/64 (BP Location: Left Arm)   Pulse (!) 52   Temp 97.8 F (36.6 C) (Oral)   Resp 17   Ht 5\' 3"  (1.6 m)   Wt 54 kg   SpO2 100%   BMI 21.08 kg/m   General: Thin female, alert and oriented, pleasant, looks tired and chronically uncomfortable, but not acutely ill Eyes: EOMI, clear conjuctivae, white sclerea Neck: supple, no masses, trachea mildline  Cardiovascular: RRR, no murmurs or rubs, no peripheral edema  Respiratory: clear to auscultation bilaterally, no wheezes, no crackles  Abdomen: soft, left upper quadrant is tender, nondistended, normal bowel tones heard, no guarding or rebound Skin: dry, no rashes  Musculoskeletal: no joint effusions, normal range of motion  Psychiatric: appropriate affect, normal speech  Neurologic: extraocular muscles intact, clear speech, moving all extremities with intact sensorium          Labs on Admission:  Basic Metabolic Panel: Recent Labs  Lab 05/31/23 1847 06/01/23 0347 06/01/23 0350 06/02/23 0207 06/05/23 2304 06/06/23 0545  NA 139 140  --  138 139 139  K 4.0 3.7  --  3.5 4.0 3.5  CL 99 99  --  102 99 103  CO2 27 25  --  26 27 25   GLUCOSE 97 97  --  99 96 106*  BUN 10 9  --  8 7 8    CREATININE 0.86 0.85 0.86 0.77 0.90 0.76  CALCIUM 10.7* 9.4  --  9.0 10.4* 8.9  MG  --  1.9  --  2.0  --  2.1  PHOS  --  3.8  --  4.3  --   --    Liver Function Tests: Recent Labs  Lab 05/31/23 1847 06/01/23 0347 06/02/23 0207 06/05/23 2304 06/06/23 0545  AST 14* 14* 11* 17 13*  ALT 8 11 10 8 10   ALKPHOS 89 73 66 72 72  BILITOT 0.4 0.6 0.3 0.4 0.5  PROT 8.3* 6.6 6.0* 8.0 6.6  ALBUMIN 4.8 3.5 3.3* 4.7 3.6   Recent Labs  Lab 05/31/23 1847 06/05/23 2304  LIPASE 43 33  No results for input(s): "AMMONIA" in the last 168 hours. CBC: Recent Labs  Lab 05/31/23 1847 06/01/23 0350 06/01/23 1456 06/02/23 0207 06/05/23 2304 06/06/23 0545  WBC 16.6* 13.1*  --  9.3 13.7* 12.5*  NEUTROABS  --   --  8.1*  --   --  10.2*  HGB 15.3* 12.7  --  11.9* 14.1 12.0  HCT 44.4 37.9  --  35.7* 41.5 36.7  MCV 88.8 90.7  --  91.5 90.0 92.7  PLT 342 287  --  279 318 294   Cardiac Enzymes: No results for input(s): "CKTOTAL", "CKMB", "CKMBINDEX", "TROPONINI" in the last 168 hours.  BNP (last 3 results) No results for input(s): "BNP" in the last 8760 hours.  ProBNP (last 3 results) No results for input(s): "PROBNP" in the last 8760 hours.  CBG: No results for input(s): "GLUCAP" in the last 168 hours.  Radiological Exams on Admission: No results found.  Assessment/Plan This is a pleasant and unfortunate 52 year old female with a history of tobacco abuse, recently diagnosed gastric MALT lymphoma being admitted to the hospital with intractable nausea vomiting, abdominal pain and concern for hematemesis.  Intractable nausea and vomiting-I suspect this is due to a combination of her recently diagnosed MALT lymphoma, as well as severe constipation.  Her nausea was also well-controlled in the hospital when she had scopolamine patch, but prior authorization for this was denied after discharge. -Observation admission -IV fluids -Keep n.p.o. for now, advance diet when okay with GI -Resume  scopolamine patch, placed pharmacy consult to assist with prior authorization so that this can be continued at discharge -Treat causes as below  Hematemesis-concern due to daily vomiting, and reports of bright red followed by dark red liquid emesis.  Patient currently not on any blood thinners.  Note normal hemoglobin, but this could be in the setting of hemoconcentration due to dehydration. -Continue IV PPI twice daily -Trend hemoglobin every 8 hours -Avoid blood thinners -Discussed with [facility] Dr. Dulce Sellar who will consult  Gastric MALT lymphoma-with concern for possible lymph node metastasis -Patient is followed by Dr. Alcide Evener, had virtual visit on 7/1 -Scheduled for outpatient PET scan prior to initiation of chemotherapy -Dr. Alcide Evener to see in AM  Severe constipation-this is certainly contributing to her abdominal crampy discomfort, and her nausea/vomiting -Avoid narcotics as much as possible -Start scheduled stool softeners and MiraLAX twice daily -Will plan for soapsuds enema this evening if no result from the above  DVT prophylaxis:  SCDs only due to concern for possible upper GI bleed    Code Status: Full Code  Consults called: Discussed with Dr. Dulce Sellar of [facility], as well as Dr. Alcide Evener.  Admission status: The appropriate patient status for this patient is INPATIENT. Inpatient status is judged to be reasonable and necessary in order to provide the required intensity of service to ensure the patient's safety. The patient's presenting symptoms, physical exam findings, and initial radiographic and laboratory data in the context of their chronic comorbidities is felt to place them at high risk for further clinical deterioration. Furthermore, it is not anticipated that the patient will be medically stable for discharge from the hospital within 2 midnights of admission.    I certify that at the point of admission it is my clinical judgment that the patient will require inpatient  hospital care spanning beyond 2 midnights from the point of admission due to high intensity of service, high risk for further deterioration and high frequency of surveillance required  Time spent:  56 minutes  Devine Klingel Sharlette Dense MD [facility] Pager [phone]  If 7PM-7AM, please contact night-coverage [email or web address] Password [facility]  06/06/2023, 9:26 AM

## 2023-06-07 ENCOUNTER — Encounter: Payer: Self-pay | Admitting: Oncology

## 2023-06-07 DIAGNOSIS — F32A Depression, unspecified: Secondary | ICD-10-CM | POA: Diagnosis present

## 2023-06-07 DIAGNOSIS — T40605A Adverse effect of unspecified narcotics, initial encounter: Secondary | ICD-10-CM | POA: Diagnosis present

## 2023-06-07 DIAGNOSIS — F17210 Nicotine dependence, cigarettes, uncomplicated: Secondary | ICD-10-CM | POA: Diagnosis present

## 2023-06-07 DIAGNOSIS — D72829 Elevated white blood cell count, unspecified: Secondary | ICD-10-CM | POA: Diagnosis present

## 2023-06-07 DIAGNOSIS — Z888 Allergy status to other drugs, medicaments and biological substances status: Secondary | ICD-10-CM | POA: Diagnosis not present

## 2023-06-07 DIAGNOSIS — R112 Nausea with vomiting, unspecified: Secondary | ICD-10-CM

## 2023-06-07 DIAGNOSIS — C884 Extranodal marginal zone B-cell lymphoma of mucosa-associated lymphoid tissue [MALT-lymphoma]: Secondary | ICD-10-CM | POA: Insufficient documentation

## 2023-06-07 DIAGNOSIS — Z6821 Body mass index (BMI) 21.0-21.9, adult: Secondary | ICD-10-CM | POA: Diagnosis not present

## 2023-06-07 DIAGNOSIS — Z79899 Other long term (current) drug therapy: Secondary | ICD-10-CM | POA: Diagnosis not present

## 2023-06-07 DIAGNOSIS — R64 Cachexia: Secondary | ICD-10-CM | POA: Diagnosis present

## 2023-06-07 DIAGNOSIS — K920 Hematemesis: Secondary | ICD-10-CM | POA: Diagnosis present

## 2023-06-07 DIAGNOSIS — K5903 Drug induced constipation: Secondary | ICD-10-CM | POA: Diagnosis present

## 2023-06-07 DIAGNOSIS — G893 Neoplasm related pain (acute) (chronic): Secondary | ICD-10-CM | POA: Diagnosis present

## 2023-06-07 LAB — CBC
HCT: 35.5 % — ABNORMAL LOW (ref 36.0–46.0)
Hemoglobin: 11.4 g/dL — ABNORMAL LOW (ref 12.0–15.0)
MCH: 30 pg (ref 26.0–34.0)
MCHC: 32.1 g/dL (ref 30.0–36.0)
MCV: 93.4 fL (ref 80.0–100.0)
Platelets: 287 10*3/uL (ref 150–400)
RBC: 3.8 MIL/uL — ABNORMAL LOW (ref 3.87–5.11)
RDW: 13.3 % (ref 11.5–15.5)
WBC: 10.8 10*3/uL — ABNORMAL HIGH (ref 4.0–10.5)
nRBC: 0 % (ref 0.0–0.2)

## 2023-06-07 LAB — HEPATITIS B CORE ANTIBODY, TOTAL: Hep B Core Total Ab: NONREACTIVE

## 2023-06-07 LAB — BASIC METABOLIC PANEL
Anion gap: 10 (ref 5–15)
BUN: 7 mg/dL (ref 6–20)
CO2: 26 mmol/L (ref 22–32)
Calcium: 8.8 mg/dL — ABNORMAL LOW (ref 8.9–10.3)
Chloride: 100 mmol/L (ref 98–111)
Creatinine, Ser: 0.89 mg/dL (ref 0.44–1.00)
GFR, Estimated: >60 mL/min (ref >60)
Glucose, Bld: 95 mg/dL (ref 70–99)
Potassium: 3.3 mmol/L — ABNORMAL LOW (ref 3.5–5.1)
Sodium: 136 mmol/L (ref 135–145)

## 2023-06-07 LAB — HEPATITIS B SURFACE ANTIGEN: Hepatitis B Surface Ag: NONREACTIVE

## 2023-06-07 MED ORDER — METHOCARBAMOL 500 MG PO TABS
500.0000 mg | ORAL_TABLET | Freq: Four times a day (QID) | ORAL | Status: DC | PRN
  Administered 2023-06-07: 500 mg via ORAL
  Filled 2023-06-07: qty 1

## 2023-06-07 MED ORDER — PANTOPRAZOLE SODIUM 40 MG PO TBEC
40.0000 mg | DELAYED_RELEASE_TABLET | Freq: Two times a day (BID) | ORAL | Status: DC
  Administered 2023-06-07 – 2023-06-08 (×2): 40 mg via ORAL
  Filled 2023-06-07 (×2): qty 1

## 2023-06-07 MED ORDER — POTASSIUM CHLORIDE IN NACL 20-0.9 MEQ/L-% IV SOLN
INTRAVENOUS | Status: DC
  Filled 2023-06-07 (×2): qty 1000

## 2023-06-07 NOTE — Plan of Care (Signed)

## 2023-06-07 NOTE — Progress Notes (Signed)
   06/07/23 0854  TOC Brief Assessment  Insurance and Status Reviewed  Patient has primary care physician Yes  Home environment has been reviewed From home  Prior level of function: Independent  Prior/Current Home Services No current home services  Social Determinants of Health Reivew SDOH reviewed no interventions necessary  Readmission risk has been reviewed Yes  Transition of care needs no transition of care needs at this time     No TOC needs identified. Please consult TOC should need arise.

## 2023-06-07 NOTE — Progress Notes (Signed)
IP PROGRESS NOTE  Subjective:    Nina Horn was readmitted yesterday with intractable nausea and vomiting.  She reported "red "and dark emesis.  She has been constipated.  She had a bowel movement after an enema last night.  She complains of constant nausea and intermittent cramping abdominal pain.  Objective: Vital signs in last 24 hours: Blood pressure 122/77, pulse (!) 51, temperature 97.9 F (36.6 C), temperature source Oral, resp. rate 16, height 5\' 3"  (1.6 m), weight 110 lb 7.2 oz (50.1 kg), SpO2 100 %, unknown if currently breastfeeding.  Intake/Output from previous day: 07/03 0701 - 07/04 0700 In: 1080 [P.O.:1080] Out: -   Physical Exam:  HEENT: No thrush Lungs: Clear bilaterally Cardiac: Rate and rhythm Abdomen: Nontender, no mass, no hepatosplenomegaly, soft, nondistended Extremities: Leg edema   Lab Results: Recent Labs    06/06/23 0545 06/06/23 0930 06/06/23 1656 06/07/23 0109  WBC 12.5*  --   --  10.8*  HGB 12.0   < > 12.0 11.4*  HCT 36.7   < > 36.8 35.5*  PLT 294  --   --  287   < > = values in this interval not displayed.    BMET Recent Labs    06/06/23 0545 06/07/23 0109  NA 139 136  K 3.5 3.3*  CL 103 100  CO2 25 26  GLUCOSE 106* 95  BUN 8 7  CREATININE 0.76 0.89  CALCIUM 8.9 8.8*     Medications: I have reviewed the patient's current medications.  Assessment/Plan:   MALT lymphoma of the stomach 02/05/2023-EGD-2 nonbleeding gastric ulcers in the incisor and gastric antrum, largest measured 10 mm, congestion, discoloration, nodularity, ulceration, and inflammation in the gastric antrum.  Nodularity and congestion in the gastric body and lesser curvature, gastric cardia, fundus, duodenum appeared normal. Pathology from the gastric antrum and "esophagus "revealed benign ulcerative gastritis with an atypical lymphoid infiltrate H. pylori negative consistent with a MALT lymphoma, B lymphocytes CD20 positive, cyclin D1 negative, B-cell gene  rearrangement positive 04/09/2023-EGD-no endoscopic evidence of ulceration, post ulcer scarring, nodularity in the gastric body, prepyloric region and gastric antrum-biopsy revealed an atypical lymphoid infiltrate consistent with a lymphoid neoplasm CT abdomen/pelvis 05/25/2023-mild wall thickening at the lesser curvature of the distal gastric body, mild gastropathic ligament lymphadenopathy, soft tissue nodules in the gastrocolic ligament CT abdomen/pelvis 05/31/2023-unchanged distal gastric body wall thickening, gastrohepatic lymph nodes   2.  Nausea/vomiting and abdominal pain secondary to #1 3.  05/22/2023-left index finger ganglion cyst excision, left palmar mass excision-benign epidermal inclusion cyst 4.  Leukocytosis-likely secondary to tobacco use 5.  Tobacco use 6.  06/06/2023-readmission with intractable nausea and vomiting  Nina Horn was readmitted yesterday with intractable nausea and vomiting.  Her symptoms are most likely related to the gastric lymphoma.  I think it would be difficult for her to tolerate radiation and the staging CT abdomen/pelvis reveals evidence of disease outside of the stomach.  I recommend systemic therapy. Ideally she would complete a staging PET scan and begin treatment as an outpatient.  If her symptoms cannot be controlled with medical therapy we will plan to begin treatment in the hospital.  She understands treatment will not immediately relieve her symptoms.  I recommend single agent rituximab.  We reviewed potential toxicities associated with rituximab including the chance of an allergic reaction, pneumonitis, CNS toxicity, hematologic toxicity, infection, and reactivation of hepatitis.  She agrees to proceed.  We will plan for weekly rituximab given for 4 doses.  Recommendations: Continue  intravenous hydration, antiemetics Trial of Decadron for nausea Rituximab to be given as an inpatient tomorrow or over the weekend if she is not tolerating a liquid  diet Liquid diet  Thornton Papas, MD   06/07/2023, 6:27 AM

## 2023-06-07 NOTE — Progress Notes (Signed)
Mobility Specialist - Progress Note   06/07/23 0914  Mobility  Activity Ambulated with assistance in hallway  Level of Assistance Modified independent, requires aide device or extra time  Assistive Device None  Distance Ambulated (ft) 570 ft  Range of Motion/Exercises Active  Activity Response Tolerated well  Mobility Referral Yes  $Mobility charge 1 Mobility  Mobility Specialist Start Time (ACUTE ONLY) 0857  Mobility Specialist Stop Time (ACUTE ONLY) 0914  Mobility Specialist Time Calculation (min) (ACUTE ONLY) 17 min   Pt received in bed and agreed to mobility. Had no issues throughout session and returned to bed with all needs met.  Marilynne Halsted Mobility Specialist

## 2023-06-07 NOTE — Progress Notes (Signed)
START ON PATHWAY REGIMEN - Lymphoma and CLL     A cycle is every 7 days:     Rituximab-xxxx   **Always confirm dose/schedule in your pharmacy ordering system**  Patient Characteristics: Marginal Zone Lymphoma, Systemic, First Line, Symptomatic Disease Type: Marginal Zone Lymphoma Disease Type: Not Applicable Disease Type: Not Applicable Localized or Systemic Disease<= Systemic Line of Therapy: First Line Asymptomatic or Symptomatic<= Symptomatic Intent of Therapy: Non-Curative / Palliative Intent, Discussed with Patient 

## 2023-06-07 NOTE — Progress Notes (Signed)
PROGRESS NOTE    Nina Horn  ZOX:[id number] DOB: Oct 24, 1971 DOA: 06/06/2023 PCP: Norm Salt, PA    Brief Narrative:   Nina Horn is a 52 y.o. female with past medical history significant for newly diagnosed MALT lymphoma of the stomach, tobacco use disorder who presented to [facility] ED on 06/06/2023 with recurrent intractable nausea/vomiting.  Patient recently admitted to [facility] 6/27 - 6/30 for similar complaints with plan to follow-up with her oncologist and GI outpatient.  Initially her nausea improved during hospitalization but once at home symptoms had returned.  Additionally she reports constipation despite use of MiraLAX at home.  She was utilizing a scopolamine patch previously in the hospital but took it off on 7/1 since it has been on for several days and there was concern about insurance coverage with this medication outpatient.  Recent virtual visit with her oncologist, Dr. Truett Perna who recommended outpatient PET scan prior to starting therapy for her gastric cancer which is scheduled on 06/21/2023  In the ED, temperature 97.8 F, HR 66, RR 17, BP 114/76, SpO2 97% on room air.  WBC 12.5, hemoglobin 12.0, platelet count 294.  Sodium 139, potassium 3.5, chloride 103, CO2 25, glucose 106, BUN 8, creatinine 0.76.  AST 13, ALT 10, total bilirubin 0.5.  Patient was given IV PPI, IV fluids.  TRH consulted for admission for further evaluation management of recurrent intractable nausea/vomiting likely secondary to underlying MALT gastric lymphoma.  Assessment & Plan:   Intractable nausea and vomiting MALT gastric lymphoma Patient representing to the ED with recurrent intractable nausea/vomiting; etiology likely secondary to her newly diagnosed gastric ulcer 2/2 MALT lymphoma.  Seen by gastroenterology, Dr. Dulce Sellar on 7/3 with recommendation of antiemetics, PPI and no need for repeat endoscopy at this time. -- Medical oncology, Dr. Truett Perna following;  appreciate assistance -- Protonix 40 mg IV every 12 hours -- Carafate 1 g p.o. 3 times daily AC/at bedtime -- Scopolamine patch -- Zofran 4 mg IV every 6 hours PRN N/V -- Advance to soft diet today per patient request -- Oncology plans to initiate chemotherapy with Rituxan given her recurrence of symptoms during this hospitalization  Constipation -- Senna twice daily -- MiraLAX twice daily -- Continue to monitor bowel movements closely  Tobacco use disorder Counseled on need for complete cessation/abstinence  Recent left hand surgery With orthopedics, hand surgery at [facility].  Continue outpatient follow-up.   DVT prophylaxis: Place and maintain sequential compression device Start: 06/06/23 0909 SCDs Start: 06/06/23 0530    Code Status: Full Code Family Communication: No family present at bedside this morning  Disposition Plan:  Level of care: Telemetry Status is: Inpatient Remains inpatient appropriate because: Plan to initiate chemotherapy with Rituxan while inpatient per oncology    Consultants:  [facility] gastroenterology, Dr. Dulce Sellar: Signed off 7/3 Medical oncology, Dr. Truett Perna  Procedures:  None  Antimicrobials:  None   Subjective: Patient seen examined bedside, sleeping but easy arousable.  Requesting further advancement of diet since liquid diet "salty".  Seen by medical oncology this morning, Dr. Truett Perna; who plans to initiate chemotherapy with Rituxan this morning.  No other specific complaints or concerns at this time.  Denies headache, no dizziness, no chest pain, no palpitations, no fever/chills/night sweats, no current nausea, no current vomiting, no diarrhea, no abdominal pain, no focal weakness, no fatigue, no paresthesias.  No acute events overnight per nursing staff.  Objective: Vitals:   06/06/23 1912 06/07/23 0422 06/07/23 0540 06/07/23 1135  BP: 129/67  122/77 124/78  Pulse: (!) 58  (!) 51 (!) 56  Resp: 15  16 18   Temp: 97.8 F (36.6 C)   97.9 F (36.6 C) 98.3 F (36.8 C)  TempSrc: Oral  Oral Oral  SpO2: 100%  100% 100%  Weight:  50.1 kg    Height:        Intake/Output Summary (Last 24 hours) at 06/07/2023 1217 Last data filed at 06/07/2023 0600 Gross per 24 hour  Intake 1440 ml  Output --  Net 1440 ml   Filed Weights   06/05/23 2301 06/07/23 0422  Weight: 54 kg 50.1 kg    Examination:  Physical Exam: GEN: NAD, alert and oriented x 3, chronically ill in appearance, appears older than stated age HEENT: NCAT, PERRL, EOMI, sclera clear, MMM PULM: CTAB w/o wheezes/crackles, normal respiratory effort CV: RRR w/o M/G/R GI: abd soft, NTND, NABS, no R/G/M MSK: no peripheral edema, moves all extremities independently NEURO: CN II-XII intact, no focal deficits, sensation to light touch intact PSYCH: normal mood/affect Integumentary: No concerning rashes/lesions/wounds noted on exposed skin surfaces.    Data Reviewed: I have personally reviewed following labs and imaging studies  CBC: Recent Labs  Lab 06/01/23 0350 06/01/23 1456 06/02/23 0207 06/05/23 2304 06/06/23 0545 06/06/23 0930 06/06/23 1656 06/07/23 0109  WBC 13.1*  --  9.3 13.7* 12.5*  --   --  10.8*  NEUTROABS  --  8.1*  --   --  10.2*  --   --   --   HGB 12.7  --  11.9* 14.1 12.0 11.7* 12.0 11.4*  HCT 37.9  --  35.7* 41.5 36.7 35.1* 36.8 35.5*  MCV 90.7  --  91.5 90.0 92.7  --   --  93.4  PLT 287  --  279 318 294  --   --  287   Basic Metabolic Panel: Recent Labs  Lab 06/01/23 0347 06/01/23 0350 06/02/23 0207 06/05/23 2304 06/06/23 0545 06/07/23 0109  NA 140  --  138 139 139 136  K 3.7  --  3.5 4.0 3.5 3.3*  CL 99  --  102 99 103 100  CO2 25  --  26 27 25 26   GLUCOSE 97  --  99 96 106* 95  BUN 9  --  8 7 8 7   CREATININE 0.85 0.86 0.77 0.90 0.76 0.89  CALCIUM 9.4  --  9.0 10.4* 8.9 8.8*  MG 1.9  --  2.0  --  2.1  --   PHOS 3.8  --  4.3  --   --   --    GFR: Estimated Creatinine Clearance: 59.8 mL/min (by C-G formula based on SCr  of 0.89 mg/dL). Liver Function Tests: Recent Labs  Lab 05/31/23 1847 06/01/23 0347 06/02/23 0207 06/05/23 2304 06/06/23 0545  AST 14* 14* 11* 17 13*  ALT 8 11 10 8 10   ALKPHOS 89 73 66 72 72  BILITOT 0.4 0.6 0.3 0.4 0.5  PROT 8.3* 6.6 6.0* 8.0 6.6  ALBUMIN 4.8 3.5 3.3* 4.7 3.6   Recent Labs  Lab 05/31/23 1847 06/05/23 2304  LIPASE 43 33   No results for input(s): "AMMONIA" in the last 168 hours. Coagulation Profile: No results for input(s): "INR", "PROTIME" in the last 168 hours. Cardiac Enzymes: No results for input(s): "CKTOTAL", "CKMB", "CKMBINDEX", "TROPONINI" in the last 168 hours. BNP (last 3 results) No results for input(s): "PROBNP" in the last 8760 hours. HbA1C: No results for input(s): "HGBA1C" in  the last 72 hours. CBG: No results for input(s): "GLUCAP" in the last 168 hours. Lipid Profile: No results for input(s): "CHOL", "HDL", "LDLCALC", "TRIG", "CHOLHDL", "LDLDIRECT" in the last 72 hours. Thyroid Function Tests: No results for input(s): "TSH", "T4TOTAL", "FREET4", "T3FREE", "THYROIDAB" in the last 72 hours. Anemia Panel: No results for input(s): "VITAMINB12", "FOLATE", "FERRITIN", "TIBC", "IRON", "RETICCTPCT" in the last 72 hours. Sepsis Labs: No results for input(s): "PROCALCITON", "LATICACIDVEN" in the last 168 hours.  No results found for this or any previous visit (from the past 240 hour(s)).       Radiology Studies: No results found.      Scheduled Meds:  feeding supplement  1 Container Oral TID BM   feeding supplement  237 mL Oral BID BM   pantoprazole (PROTONIX) IV  40 mg Intravenous Q12H   polyethylene glycol  17 g Oral BID   scopolamine  1 patch Transdermal Q72H   senna  1 tablet Oral BID   sucralfate  1 g Oral TID WC & HS   Continuous Infusions:   LOS: 0 days    Time spent: 52 minutes spent on chart review, discussion with nursing staff, consultants, updating family and interview/physical exam; more than 50% of that time  was spent in counseling and/or coordination of care.    Nina Philips [place], DO [facility] Available via Epic secure chat 7am-7pm After these hours, please refer to coverage provider listed on [email or web address] 06/07/2023, 12:17 PM

## 2023-06-08 ENCOUNTER — Other Ambulatory Visit: Payer: Self-pay

## 2023-06-08 ENCOUNTER — Encounter: Payer: Self-pay | Admitting: *Deleted

## 2023-06-08 DIAGNOSIS — R112 Nausea with vomiting, unspecified: Secondary | ICD-10-CM | POA: Diagnosis not present

## 2023-06-08 LAB — PHOSPHORUS: Phosphorus: 4.1 mg/dL (ref 2.5–4.6)

## 2023-06-08 LAB — CBC
HCT: 33.8 % — ABNORMAL LOW (ref 36.0–46.0)
Hemoglobin: 11 g/dL — ABNORMAL LOW (ref 12.0–15.0)
MCH: 29.6 pg (ref 26.0–34.0)
MCHC: 32.5 g/dL (ref 30.0–36.0)
MCV: 91.1 fL (ref 80.0–100.0)
Platelets: 284 10*3/uL (ref 150–400)
RBC: 3.71 MIL/uL — ABNORMAL LOW (ref 3.87–5.11)
RDW: 13.2 % (ref 11.5–15.5)
WBC: 6.3 10*3/uL (ref 4.0–10.5)
nRBC: 0 % (ref 0.0–0.2)

## 2023-06-08 LAB — BASIC METABOLIC PANEL
Anion gap: 8 (ref 5–15)
BUN: 10 mg/dL (ref 6–20)
CO2: 27 mmol/L (ref 22–32)
Calcium: 8.6 mg/dL — ABNORMAL LOW (ref 8.9–10.3)
Chloride: 104 mmol/L (ref 98–111)
Creatinine, Ser: 0.68 mg/dL (ref 0.44–1.00)
GFR, Estimated: >60 mL/min (ref >60)
Glucose, Bld: 98 mg/dL (ref 70–99)
Potassium: 3.9 mmol/L (ref 3.5–5.1)
Sodium: 139 mmol/L (ref 135–145)

## 2023-06-08 LAB — MAGNESIUM: Magnesium: 2.1 mg/dL (ref 1.7–2.4)

## 2023-06-08 MED ORDER — SCOPOLAMINE 1 MG/3DAYS TD PT72: 1.0000 | MEDICATED_PATCH | TRANSDERMAL | 0 refills | Status: DC

## 2023-06-08 MED ORDER — POLYETHYLENE GLYCOL 3350 17 G PO PACK: 17.0000 g | PACK | Freq: Two times a day (BID) | ORAL | 0 refills | Status: DC

## 2023-06-08 MED ORDER — SENNOSIDES-DOCUSATE SODIUM 8.6-50 MG PO TABS: 1.0000 | ORAL_TABLET | Freq: Two times a day (BID) | ORAL | 0 refills | Status: DC

## 2023-06-08 MED ORDER — PANTOPRAZOLE SODIUM 40 MG PO TBEC: 40.0000 mg | DELAYED_RELEASE_TABLET | Freq: Two times a day (BID) | ORAL | 2 refills | Status: DC

## 2023-06-08 NOTE — Progress Notes (Signed)
PATIENT NAVIGATOR PROGRESS NOTE  Name: Nina Horn Date: 06/08/2023 MRN: [id number]  DOB: 1971/06/23   Reason for visit:  PET scan schedule change and appt with Dr Truett Perna appt change  Comments:  Called and spoke with Nina Horn and gave her instructions for PET scan on 06/11/23 at [facility] with arrival at 1100  She is now scheduled to see Dr Truett Perna on Tuesday at 0820,Directions to building and parking reviewed as well as contact number    Time spent counseling/coordinating care: 45-60 minutes

## 2023-06-08 NOTE — Progress Notes (Signed)
IP PROGRESS NOTE  Subjective:    Ms. Fayard reports feeling better.  She tolerated a diet yesterday.  Nausea has improved.  Objective: Vital signs in last 24 hours: Blood pressure (!) 96/58, pulse 61, temperature 97.8 F (36.6 C), temperature source Oral, resp. rate 18, height 5\' 3"  (1.6 m), weight 110 lb 7.2 oz (50.1 kg), SpO2 100 %, unknown if currently breastfeeding.  Intake/Output from previous day: 07/04 0701 - 07/05 0700 In: 990.9 [I.V.:990.9] Out: -   Physical Exam:  HEENT: No thrush  Abdomen: Nontender, no mass, no hepatosplenomegaly, soft, nondistended Extremities: No leg edema  Lab Results: Recent Labs    06/07/23 0109 06/08/23 0539  WBC 10.8* 6.3  HGB 11.4* 11.0*  HCT 35.5* 33.8*  PLT 287 284    BMET Recent Labs    06/07/23 0109 06/08/23 0539  NA 136 139  K 3.3* 3.9  CL 100 104  CO2 26 27  GLUCOSE 95 98  BUN 7 10  CREATININE 0.89 0.68  CALCIUM 8.8* 8.6*     Medications: I have reviewed the patient's current medications.  Assessment/Plan:   MALT lymphoma of the stomach 02/05/2023-EGD-2 nonbleeding gastric ulcers in the incisor and gastric antrum, largest measured 10 mm, congestion, discoloration, nodularity, ulceration, and inflammation in the gastric antrum.  Nodularity and congestion in the gastric body and lesser curvature, gastric cardia, fundus, duodenum appeared normal. Pathology from the gastric antrum and "esophagus "revealed benign ulcerative gastritis with an atypical lymphoid infiltrate H. pylori negative consistent with a MALT lymphoma, B lymphocytes CD20 positive, cyclin D1 negative, B-cell gene rearrangement positive 04/09/2023-EGD-no endoscopic evidence of ulceration, post ulcer scarring, nodularity in the gastric body, prepyloric region and gastric antrum-biopsy revealed an atypical lymphoid infiltrate consistent with a lymphoid neoplasm CT abdomen/pelvis 05/25/2023-mild wall thickening at the lesser curvature of the distal gastric  body, mild gastropathic ligament lymphadenopathy, soft tissue nodules in the gastrocolic ligament CT abdomen/pelvis 05/31/2023-unchanged distal gastric body wall thickening, gastrohepatic lymph nodes   2.  Nausea/vomiting and abdominal pain secondary to #1 3.  05/22/2023-left index finger ganglion cyst excision, left palmar mass excision-benign epidermal inclusion cyst 4.  Leukocytosis-likely secondary to tobacco use 5.  Tobacco use 6.  06/06/2023-readmission with intractable nausea and vomiting  Ms. Bole was readmitted 06/06/2023 with intractable nausea and vomiting.  Her symptoms are most likely related to the gastric lymphoma.  Constipation may have been contributing.  She is now tolerating a diet.  I recommend she follow a liquid diet.  She reports tolerance of Ensure and boost.  I had planned to treat her as an inpatient if needed.  However her clinical status has improved significantly over the past 24 hours.  We will try to arrange for a staging PET scan next week and then initiate systemic therapy.  We can begin a trial of Decadron if she has recurrent nausea and vomiting.  I encouraged her to contact the office if she develops increased nausea or abdominal pain.    Recommendations: Continue antiemetics Trial of Decadron for recurrent nausea Liquid diet Outpatient PET to be scheduled ASAP Outpatient follow-up at the Cancer center with the plan to begin rituximab will be scheduled after the PET  Thornton Papas, MD   06/08/2023, 7:54 AM

## 2023-06-08 NOTE — Progress Notes (Signed)
Mobility Specialist - Progress Note   06/08/23 0944  Mobility  Activity Ambulated with assistance in hallway  Level of Assistance Modified independent, requires aide device or extra time  Assistive Device None  Distance Ambulated (ft) 750 ft  Range of Motion/Exercises Active  Activity Response Tolerated well  Mobility Referral Yes  $Mobility charge 1 Mobility  Mobility Specialist Start Time (ACUTE ONLY) 0920  Mobility Specialist Stop Time (ACUTE ONLY) 0933  Mobility Specialist Time Calculation (min) (ACUTE ONLY) 13 min   Pt received in bed and agreed to mobility. Had no issues throughout session. Returned to bed with all needs met.  Marilynne Halsted Mobility Specialist

## 2023-06-08 NOTE — Discharge Summary (Signed)
Physician Discharge Summary  Nina Horn:[id number] DOB: 10-Oct-1971 DOA: 06/06/2023  PCP: Norm Salt, PA  Admit date: 06/06/2023 Discharge date: 06/08/2023  Admitted From: Home Disposition: Home  Recommendations for Outpatient Follow-up:  Follow up with PCP in 1-2 weeks Follow-up with medical oncology, Dr. Truett Perna as scheduled Continue scopolamine patch, replace every 72 hours Protonix increased to 40 mg p.o. twice daily; continue Carafate Increased MiraLAX to twice daily, Senokot to twice daily to assist with severe constipation  Home Health: No Equipment/Devices: None  Discharge Condition: Able CODE STATUS: Full code Diet recommendation: Liquid diet, possible soft diet if tolerates  History of present illness:  Nina Horn is a 52 y.o. female with past medical history significant for newly diagnosed MALT lymphoma of the stomach, tobacco use disorder who presented to [facility] ED on 06/06/2023 with recurrent intractable nausea/vomiting.  Patient recently admitted to [facility] 6/27 - 6/30 for similar complaints with plan to follow-up with her oncologist and GI outpatient.  Initially her nausea improved during hospitalization but once at home symptoms had returned.  Additionally she reports constipation despite use of MiraLAX at home.  She was utilizing a scopolamine patch previously in the hospital but took it off on 7/1 since it has been on for several days and there was concern about insurance coverage with this medication outpatient.  Recent virtual visit with her oncologist, Dr. Truett Perna who recommended outpatient PET scan prior to starting therapy for her gastric cancer which is scheduled on 06/21/2023   In the ED, temperature 97.8 F, HR 66, RR 17, BP 114/76, SpO2 97% on room air.  WBC 12.5, hemoglobin 12.0, platelet count 294.  Sodium 139, potassium 3.5, chloride 103, CO2 25, glucose 106, BUN 8, creatinine 0.76.  AST 13, ALT 10, total  bilirubin 0.5.  Patient was given IV PPI, IV fluids.  TRH consulted for admission for further evaluation management of recurrent intractable nausea/vomiting likely secondary to underlying MALT gastric lymphoma.  Hospital course:  Intractable nausea and vomiting MALT gastric lymphoma Patient representing to the ED with recurrent intractable nausea/vomiting; etiology likely secondary to her newly diagnosed gastric ulcer 2/2 MALT lymphoma.  Seen by gastroenterology, Dr. Dulce Sellar on 7/3 with recommendation of antiemetics, PPI and no need for repeat endoscopy at this time.  Patient was started on Protonix 40 mg IV every 12 hours, continued on Carafate and restarted on scopolamine patch.  Supported with Zofran as needed with improvement of symptoms.  Medical oncology, Dr. Truett Perna followed during hospital course.  Initially plans were to initiate chemotherapy with Rituxan, but given them improvement of her symptoms with medical treatment will defer to outpatient.  Plan for outpatient staging PET scan next week.  Outpatient follow-up with medical oncology.   Constipation Senna twice daily, MiraLAX twice daily   Tobacco use disorder Counseled on need for complete cessation/abstinence   Recent left hand surgery Recent left index finger ganglion cyst excision, left palmar mass excision with benign epidermal inclusion cyst on 05/22/2023.  Follows with orthopedics, hand surgery at [facility].  Continue outpatient follow-up.  Discharge Diagnoses:  Principal Problem:   Intractable nausea and vomiting Active Problems:   MALT lymphoma [facility])    Discharge Instructions  Discharge Instructions     Call MD for:  difficulty breathing, headache or visual disturbances   Complete by: As directed    Call MD for:  extreme fatigue   Complete by: As directed    Call MD for:  persistant dizziness or  light-headedness   Complete by: As directed    Call MD for:  persistant nausea and vomiting   Complete by: As  directed    Call MD for:  severe uncontrolled pain   Complete by: As directed    Call MD for:  temperature >100.4   Complete by: As directed    Diet - low sodium heart healthy   Complete by: As directed    Increase activity slowly   Complete by: As directed       Allergies as of 06/08/2023       Reactions   Promethazine Hcl Other (See Comments)   delirium        Medication List     TAKE these medications    acetaminophen 325 MG tablet Commonly known as: TYLENOL Take 2 tablets (650 mg total) by mouth every 6 (six) hours as needed for mild pain, fever or headache.   HYDROcodone-acetaminophen 5-325 MG tablet Commonly known as: NORCO/VICODIN Take 1 tablet by mouth every 6 (six) hours as needed for moderate pain.   ondansetron 4 MG tablet Commonly known as: ZOFRAN Take 4 mg by mouth every 8 (eight) hours as needed for vomiting or nausea.   pantoprazole 40 MG tablet Commonly known as: PROTONIX Take 1 tablet (40 mg total) by mouth 2 (two) times daily. What changed: when to take this   polyethylene glycol 17 g packet Commonly known as: MIRALAX / GLYCOLAX Take 17 g by mouth 2 (two) times daily. What changed:  when to take this reasons to take this   scopolamine 1 MG/3DAYS Commonly known as: Transderm-Scop Place 1 patch (1.5 mg total) onto the skin every 3 (three) days. Start taking on: June 09, 2023   senna-docusate 8.6-50 MG tablet Commonly known as: Senokot-S Take 1 tablet by mouth 2 (two) times daily. What changed: when to take this   sucralfate 1 g tablet Commonly known as: CARAFATE Take 1 tablet (1 g total) by mouth 4 (four) times daily -  with meals and at bedtime.        Follow-up Information     Norm Salt, [place]. Schedule an appointment as soon as possible for a visit in 1 week(s).   Specialty: Physician Assistant Contact information: [address] [place] [address] [phone]         Ladene Artist, MD. Schedule an  appointment as soon as possible for a visit.   Specialty: Oncology Contact information: [address] [place] [address] [phone]                Allergies  Allergen Reactions   Promethazine Hcl Other (See Comments)    delirium    Consultations: [facility] gastroenterology, Dr. Dulce Sellar Medical oncology, Dr. Truett Perna   Procedures/Studies: CT ABDOMEN PELVIS W CONTRAST  Result Date: 05/31/2023 CLINICAL DATA:  Acute abdominal pain EXAM: CT ABDOMEN AND PELVIS WITH CONTRAST TECHNIQUE: Multidetector CT imaging of the abdomen and pelvis was performed using the standard protocol following bolus administration of intravenous contrast. RADIATION DOSE REDUCTION: This exam was performed according to the departmental dose-optimization program which includes automated exposure control, adjustment of the mA and/or kV according to patient size and/or use of iterative reconstruction technique. CONTRAST:  75mL OMNIPAQUE IOHEXOL 300 MG/ML  SOLN COMPARISON:  CT examination dated May 25, 2019 FINDINGS: Lower chest: No acute abnormality. Hepatobiliary: Focal fatty infiltration along the falciform ligament. No gallstones, gallbladder wall thickening or biliary dilatation. Pancreas: Unremarkable. No pancreatic ductal dilatation or surrounding  inflammatory changes. Spleen: Normal in size without focal abnormality. Adrenals/Urinary Tract: Adrenal glands are unremarkable. Kidneys are normal, without renal calculi, focal lesion, or hydronephrosis. Bladder is unremarkable. Stomach/Bowel: Gastric wall thickening of the distal gastric body, unchanged. Small bowel loops are normal in caliber. Appendix not identified, however no inflammatory changes in the pericecal region. No evidence of bowel wall thickening, distention, or inflammatory changes. Retained contrast in the rectosigmoid region, likely secondary to prior CT examination dated Vascular/Lymphatic: Aortic atherosclerosis. No enlarged abdominal or  pelvic lymph nodes. Reproductive: Uterus and bilateral adnexa are unremarkable. Other: Gastrohepatic lymph nodes suggesting metastatic disease. No abdominopelvic ascites. Musculoskeletal: No acute or significant osseous findings. IMPRESSION: 1. Gastric wall thickening of the distal gastric body, unchanged, which may suggest patient's known gastric malignancy. 2. Gastrohepatic lymph nodes suggesting metastatic disease. 3. No evidence of bowel obstruction. No free air or fluid. 4. Aortic atherosclerosis. Aortic Atherosclerosis (ICD10-I70.0). Electronically Signed   By: Larose Hires D.O.   On: 05/31/2023 20:46   CT ABDOMEN PELVIS W CONTRAST  Result Date: 05/28/2023 CLINICAL DATA:  Newly diagnosed gastric carcinoma. Staging. * Tracking Code: BO * EXAM: CT ABDOMEN AND PELVIS WITH CONTRAST TECHNIQUE: Multidetector CT imaging of the abdomen and pelvis was performed using the standard protocol following bolus administration of intravenous contrast. RADIATION DOSE REDUCTION: This exam was performed according to the departmental dose-optimization program which includes automated exposure control, adjustment of the mA and/or kV according to patient size and/or use of iterative reconstruction technique. CONTRAST:  ISOVUE-300 IOPAMIDOL (ISOVUE-300) INJECTION 61% COMPARISON:  Noncontrast CT on 03/13/2021 FINDINGS: Lower Chest: No acute findings. Hepatobiliary: No hepatic masses identified. Gallbladder is unremarkable. No evidence of biliary ductal dilatation. Pancreas:  No mass or inflammatory changes. Spleen: Within normal limits in size and appearance. Adrenals/Urinary Tract: No suspicious masses identified. No evidence of ureteral calculi or hydronephrosis. Stomach/Bowel: Mild asymmetric wall thickening is seen along the lesser curvature of the distal gastric body, suspicious for known primary gastric carcinoma. No evidence of bowel obstruction, inflammatory process or abnormal fluid collections. Vascular/Lymphatic:  Mild lymphadenopathy is seen in the gastrohepatic ligament, with largest lymph node measuring 18 mm on image 17/2. Aortic atherosclerotic calcification incidentally noted. No acute vascular findings. Reproductive:  No mass or other significant abnormality. Other: Several soft tissue nodules are seen in the gastrocolic ligament in the anterior mid and right abdomen, largest measuring 10 mm on image 31/2. This may be due to lymphadenopathy or peritoneal metastatic disease. No evidence of ascites. Musculoskeletal:  No suspicious bone lesions identified. IMPRESSION: Mild asymmetric wall thickening along the lesser curvature of the distal gastric body, suspicious for known primary gastric carcinoma. Mild gastrohepatic ligament lymphadenopathy, consistent with metastatic disease. Several small soft tissue nodules in the gastrocolic ligament, which may be due to lymphadenopathy or peritoneal metastatic disease. Aortic Atherosclerosis (ICD10-I70.0). Electronically Signed   By: Danae Orleans M.D.   On: 05/28/2023 16:53     Subjective: Seen examined bedside, resting calmly.  Lying in bed.  Eating breakfast.  In good spirits, tolerating without any further nausea or vomiting.  Seen by Dr. Truett Perna this morning, now plan for outpatient chemotherapy after staging PET scan.  Ready for discharge home.  No other questions or concerns at this time.  Denies headache, no dizziness, no chest pain, no palpitations, no fever/chills/night sweats, no nausea cefonicid diarrhea, no focal weakness, no fatigue, no paresthesias.  No acute events overnight per nursing staff.  Discharge Exam: Vitals:   06/07/23 1955 06/08/23 0426  BP:  121/77 (!) 96/58  Pulse: 65 61  Resp: 16 18  Temp: 98.1 F (36.7 C) 97.8 F (36.6 C)  SpO2: 100% 100%   Vitals:   06/07/23 0540 06/07/23 1135 06/07/23 1955 06/08/23 0426  BP: 122/77 124/78 121/77 (!) 96/58  Pulse: (!) 51 (!) 56 65 61  Resp: 16 18 16 18   Temp: 97.9 F (36.6 C) 98.3 F (36.8 C)  98.1 F (36.7 C) 97.8 F (36.6 C)  TempSrc: Oral Oral Oral Oral  SpO2: 100% 100% 100% 100%  Weight:      Height:        Physical Exam: GEN: NAD, alert and oriented x 3, thin in appearance HEENT: NCAT, PERRL, EOMI, sclera clear, MMM PULM: CTAB w/o wheezes/crackles, normal respiratory effort, on room air CV: RRR w/o M/G/R GI: abd soft, NTND, NABS, no R/G/M MSK: no peripheral edema, muscle strength globally intact 5/5 bilateral upper/lower extremities NEURO: CN II-XII intact, no focal deficits, sensation to light touch intact PSYCH: normal mood/affect Integumentary: Noted surgical incision sites left hand, healing well with no concerning fluctuance/erythema; otherwise no other concerning rashes/lesions/wounds noted on exposed skin surfaces.      The results of significant diagnostics from this hospitalization (including imaging, microbiology, ancillary and laboratory) are listed below for reference.     Microbiology: No results found for this or any previous visit (from the past 240 hour(s)).   Labs: BNP (last 3 results) No results for input(s): "BNP" in the last 8760 hours. Basic Metabolic Panel: Recent Labs  Lab 06/02/23 0207 06/05/23 2304 06/06/23 0545 06/07/23 0109 06/08/23 0539  NA 138 139 139 136 139  K 3.5 4.0 3.5 3.3* 3.9  CL 102 99 103 100 104  CO2 26 27 25 26 27   GLUCOSE 99 96 106* 95 98  BUN 8 7 8 7 10   CREATININE 0.77 0.90 0.76 0.89 0.68  CALCIUM 9.0 10.4* 8.9 8.8* 8.6*  MG 2.0  --  2.1  --  2.1  PHOS 4.3  --   --   --  4.1   Liver Function Tests: Recent Labs  Lab 06/02/23 0207 06/05/23 2304 06/06/23 0545  AST 11* 17 13*  ALT 10 8 10   ALKPHOS 66 72 72  BILITOT 0.3 0.4 0.5  PROT 6.0* 8.0 6.6  ALBUMIN 3.3* 4.7 3.6   Recent Labs  Lab 06/05/23 2304  LIPASE 33   No results for input(s): "AMMONIA" in the last 168 hours. CBC: Recent Labs  Lab 06/01/23 1456 06/02/23 0207 06/02/23 0207 06/05/23 2304 06/06/23 0545 06/06/23 0930  06/06/23 1656 06/07/23 0109 06/08/23 0539  WBC  --  9.3  --  13.7* 12.5*  --   --  10.8* 6.3  NEUTROABS 8.1*  --   --   --  10.2*  --   --   --   --   HGB  --  11.9*   < > 14.1 12.0 11.7* 12.0 11.4* 11.0*  HCT  --  35.7*   < > 41.5 36.7 35.1* 36.8 35.5* 33.8*  MCV  --  91.5  --  90.0 92.7  --   --  93.4 91.1  PLT  --  279  --  318 294  --   --  287 284   < > = values in this interval not displayed.   Cardiac Enzymes: No results for input(s): "CKTOTAL", "CKMB", "CKMBINDEX", "TROPONINI" in the last 168 hours. BNP: Invalid input(s): "POCBNP" CBG: No results for input(s): "GLUCAP" in the last 168  hours. D-Dimer No results for input(s): "DDIMER" in the last 72 hours. Hgb A1c No results for input(s): "HGBA1C" in the last 72 hours. Lipid Profile No results for input(s): "CHOL", "HDL", "LDLCALC", "TRIG", "CHOLHDL", "LDLDIRECT" in the last 72 hours. Thyroid function studies No results for input(s): "TSH", "T4TOTAL", "T3FREE", "THYROIDAB" in the last 72 hours.  Invalid input(s): "FREET3" Anemia work up No results for input(s): "VITAMINB12", "FOLATE", "FERRITIN", "TIBC", "IRON", "RETICCTPCT" in the last 72 hours. Urinalysis    Component Value Date/Time   COLORURINE YELLOW 06/05/2023 2304   APPEARANCEUR CLEAR 06/05/2023 2304   LABSPEC 1.023 06/05/2023 2304   PHURINE 6.5 06/05/2023 2304   GLUCOSEU NEGATIVE 06/05/2023 2304   HGBUR NEGATIVE 06/05/2023 2304   BILIRUBINUR NEGATIVE 06/05/2023 2304   KETONESUR TRACE (A) 06/05/2023 2304   PROTEINUR TRACE (A) 06/05/2023 2304   UROBILINOGEN 0.2 12/08/2012 0240   NITRITE NEGATIVE 06/05/2023 2304   LEUKOCYTESUR NEGATIVE 06/05/2023 2304   Sepsis Labs Recent Labs  Lab 06/05/23 2304 06/06/23 0545 06/07/23 0109 06/08/23 0539  WBC 13.7* 12.5* 10.8* 6.3   Microbiology No results found for this or any previous visit (from the past 240 hour(s)).   Time coordinating discharge: Over 30 minutes  SIGNED:   Alvira Philips [place], DO  [facility] 06/08/2023, 10:05 AM

## 2023-06-11 ENCOUNTER — Encounter: Payer: Self-pay | Admitting: Oncology

## 2023-06-11 ENCOUNTER — Other Ambulatory Visit: Payer: Self-pay

## 2023-06-11 ENCOUNTER — Encounter
Admission: RE | Admit: 2023-06-11 | Discharge: 2023-06-11 | Disposition: A | Discharge status: Home / Self Care | Payer: Medicaid Other | Source: Ambulatory Visit | Attending: Oncology | Admitting: Oncology

## 2023-06-11 DIAGNOSIS — C884 Extranodal marginal zone B-cell lymphoma of mucosa-associated lymphoid tissue [MALT-lymphoma]: Secondary | ICD-10-CM | POA: Diagnosis not present

## 2023-06-11 LAB — GLUCOSE, CAPILLARY: Glucose-Capillary: 95 mg/dL (ref 70–99)

## 2023-06-11 MED ORDER — FLUDEOXYGLUCOSE F - 18 (FDG) INJECTION
5.7000 | Freq: Once | INTRAVENOUS | Status: AC
  Administered 2023-06-11: 6.16 via INTRAVENOUS

## 2023-06-12 ENCOUNTER — Inpatient Hospital Stay: Payer: Medicaid Other | Admitting: Oncology

## 2023-06-12 VITALS — BP 118/81 | HR 70 | Temp 98.1°F | Resp 18 | Ht 63.0 in | Wt 116.2 lb

## 2023-06-12 DIAGNOSIS — D72829 Elevated white blood cell count, unspecified: Secondary | ICD-10-CM | POA: Diagnosis not present

## 2023-06-12 DIAGNOSIS — Z720 Tobacco use: Secondary | ICD-10-CM | POA: Diagnosis not present

## 2023-06-12 DIAGNOSIS — C884 Extranodal marginal zone B-cell lymphoma of mucosa-associated lymphoid tissue [MALT-lymphoma]: Secondary | ICD-10-CM | POA: Diagnosis present

## 2023-06-12 DIAGNOSIS — Z5112 Encounter for antineoplastic immunotherapy: Secondary | ICD-10-CM | POA: Diagnosis present

## 2023-06-12 NOTE — Progress Notes (Signed)
[facility] OFFICE PROGRESS NOTE   Diagnosis: Gastric MALT lymphoma  INTERVAL HISTORY:   Nina Horn returns as scheduled.  She was discharged from the hospital on 06/08/2023 after admission with recurrent nausea and vomiting.  She reports nausea and abdominal pain are significantly improved.  She is avoiding "cold "liquids and believes this has helped.  She is following a liquid and mechanical soft diet.  She continues a bowel regimen.  Objective:  Vital signs in last 24 hours:  Blood pressure 118/81, pulse 70, temperature 98.1 F (36.7 C), resp. rate 18, height 5\' 3"  (1.6 m), weight 116 lb 3.2 oz (52.7 kg), SpO2 100 %, unknown if currently breastfeeding.   HEENT: Several 1-3 mm yellow lesions over the left tonsil Lymphatics: Pea-sized left scalene node.  No other cervical, supraclavicular, or axillary nodes Resp: Lungs clear bilaterally Cardio: Regular rate and rhythm GI: No hepatosplenomegaly, soft, mild tenderness in the mid upper abdomen, no mass Vascular: No leg edema   Lab Results:  Lab Results  Component Value Date   WBC 6.3 06/08/2023   HGB 11.0 (L) 06/08/2023   HCT 33.8 (L) 06/08/2023   MCV 91.1 06/08/2023   PLT 284 06/08/2023   NEUTROABS 10.2 (H) 06/06/2023    CMP  Lab Results  Component Value Date   NA 139 06/08/2023   K 3.9 06/08/2023   CL 104 06/08/2023   CO2 27 06/08/2023   GLUCOSE 98 06/08/2023   BUN 10 06/08/2023   CREATININE 0.68 06/08/2023   CALCIUM 8.6 (L) 06/08/2023   PROT 6.6 06/06/2023   ALBUMIN 3.6 06/06/2023   AST 13 (L) 06/06/2023   ALT 10 06/06/2023   ALKPHOS 72 06/06/2023   BILITOT 0.5 06/06/2023   GFRNONAA >60 06/08/2023   GFRAA >60 04/05/2020    No results found for: "CEA1", "CEA", "ZOX096", "CA125"  No results found for: "INR", "LABPROT"  Imaging:  NM PET Image Initial (PI) Skull Base To Thigh (F-18 FDG)  Result Date: 06/11/2023 CLINICAL DATA:  Staging treatment strategy for non-Hodgkin's lymphoma. EXAM:  NUCLEAR MEDICINE PET SKULL BASE TO THIGH TECHNIQUE: 6.2 mCi F-18 FDG was injected intravenously. Full-ring PET imaging was performed from the skull base to thigh after the radiotracer. CT data was obtained and used for attenuation correction and anatomic localization. Fasting blood glucose: 95 mg/dl COMPARISON:  CT 04/54/0981 FINDINGS: Mediastinal blood pool activity: SUV max 2.2 Liver activity: SUV max 2.3 NECK: No hypermetabolic lymph nodes in the neck. Incidental CT findings: None. CHEST: No hypermetabolic mediastinal or hilar nodes. No suspicious pulmonary nodules on the CT scan. Incidental CT findings: None. ABDOMEN/PELVIS: Moderate metabolic activity in the gastric antrum. Moderate metabolic activity associated with enlarged gastrohepatic ligament lymph node measuring 18 mm with SUV max equal 3.1 (minimally greater than liver). Hypermetabolic peritoneal nodule in the LEFT ventral peritoneal space measures 8 mm with SUV max equal 2.2. (Image 102/4). No additional radiotracer avid peritoneal metastasis. No periaortic or pelvic lymphadenopathy. Normal spleen. Incidental CT findings: None. SKELETON: No focal hypermetabolic activity to suggest skeletal metastasis. Incidental CT findings: None. IMPRESSION: 1. Low metabolic activity associated with the gastric antrum, gastrohepatic ligament lymph node and peritoneal nodal metastasis favors low-grade lymphoma. 2. No skeletal metastasis.  Normal spleen. Electronically Signed   By: Genevive Bi M.D.   On: 06/11/2023 14:15    Medications: I have reviewed the patient's current medications.   Assessment/Plan: MALT lymphoma of the stomach 02/05/2023-EGD-2 nonbleeding gastric ulcers in the incisor and gastric antrum, largest measured 10  mm, congestion, discoloration, nodularity, ulceration, and inflammation in the gastric antrum.  Nodularity and congestion in the gastric body and lesser curvature, gastric cardia, fundus, duodenum appeared normal. Pathology from the  gastric antrum and "esophagus "revealed benign ulcerative gastritis with an atypical lymphoid infiltrate H. pylori negative consistent with a MALT lymphoma, B lymphocytes CD20 positive, cyclin D1 negative, B-cell gene rearrangement positive 04/09/2023-EGD-no endoscopic evidence of ulceration, post ulcer scarring, nodularity in the gastric body, prepyloric region and gastric antrum-biopsy revealed an atypical lymphoid infiltrate consistent with a lymphoid neoplasm CT abdomen/pelvis 05/25/2023-mild wall thickening at the lesser curvature of the distal gastric body, mild gastropathic ligament lymphadenopathy, soft tissue nodules in the gastrocolic ligament CT abdomen/pelvis 05/31/2023-unchanged distal gastric body wall thickening, gastrohepatic lymph nodes PET 06/11/2023-low-level metabolic activity associated with the gastric antrum, a gastrohepatic ligament lymph node, and a peritoneal nodule, normal spleen Cycle 1 rituximab 06/14/2023   2.  Nausea/vomiting and abdominal pain secondary to #1 3.  05/22/2023-left index finger ganglion cyst excision, left palmar mass excision-benign epidermal inclusion cyst 4.  Leukocytosis-likely secondary to tobacco use 5.  Tobacco use 6.  06/06/2023-readmission with intractable nausea and vomiting   Disposition: Nina Horn has been diagnosed with gastric MALT lymphoma.  There is radiologic evidence of lymphoma involving a perigastric lymph node and a peritoneal nodule.  She is symptomatic with intermittent nausea and abdominal pain.  We discussed treatment options.  I recommend single agent rituximab given the extent of disease outside of the stomach.  We reviewed potential toxicities associated with rituximab again today.  The plan is to begin rituximab on 06/14/2023.  She will return for an office visit on 06/21/2023.  She will call for recurrent nausea and vomiting. I reviewed the PET findings and images with her.   Thornton Papas, MD  06/12/2023  8:41 AM

## 2023-06-13 ENCOUNTER — Other Ambulatory Visit: Payer: Self-pay

## 2023-06-13 ENCOUNTER — Encounter: Payer: Self-pay | Admitting: Oncology

## 2023-06-13 ENCOUNTER — Telehealth: Payer: Self-pay | Admitting: *Deleted

## 2023-06-13 NOTE — Telephone Encounter (Signed)
Spoke w/dietician and informed patient that [employer] can not be found in stores. Need to order from [facility] or [facility]. The Ensure Clear can be found at [employer].

## 2023-06-14 ENCOUNTER — Other Ambulatory Visit: Payer: Medicaid Other

## 2023-06-14 ENCOUNTER — Inpatient Hospital Stay: Payer: Medicaid Other

## 2023-06-14 VITALS — BP 108/75 | HR 66 | Temp 98.5°F | Resp 18 | Ht 63.0 in | Wt 112.3 lb

## 2023-06-14 DIAGNOSIS — R112 Nausea with vomiting, unspecified: Secondary | ICD-10-CM

## 2023-06-14 DIAGNOSIS — C884 Extranodal marginal zone B-cell lymphoma of mucosa-associated lymphoid tissue [MALT-lymphoma]: Secondary | ICD-10-CM

## 2023-06-14 DIAGNOSIS — Z5112 Encounter for antineoplastic immunotherapy: Secondary | ICD-10-CM | POA: Diagnosis not present

## 2023-06-14 MED ORDER — FAMOTIDINE IN NACL 20-0.9 MG/50ML-% IV SOLN
20.0000 mg | Freq: Once | INTRAVENOUS | Status: AC | PRN
  Administered 2023-06-14: 20 mg via INTRAVENOUS

## 2023-06-14 MED ORDER — SODIUM CHLORIDE 0.9 % IV SOLN: Freq: Once | INTRAVENOUS | Status: AC

## 2023-06-14 MED ORDER — ONDANSETRON HCL 4 MG/2ML IJ SOLN
8.0000 mg | Freq: Once | INTRAMUSCULAR | Status: AC
  Administered 2023-06-14: 8 mg via INTRAVENOUS
  Filled 2023-06-14: qty 4

## 2023-06-14 MED ORDER — SODIUM CHLORIDE 0.9 % IV SOLN: Freq: Once | INTRAVENOUS | Status: DC | PRN

## 2023-06-14 MED ORDER — SODIUM CHLORIDE 0.9 % IV SOLN
375.0000 mg/m2 | Freq: Once | INTRAVENOUS | Status: AC
  Administered 2023-06-14: 600 mg via INTRAVENOUS
  Filled 2023-06-14: qty 50

## 2023-06-14 MED ORDER — DIPHENHYDRAMINE HCL 25 MG PO CAPS
50.0000 mg | ORAL_CAPSULE | Freq: Once | ORAL | Status: AC
  Administered 2023-06-14: 50 mg via ORAL
  Filled 2023-06-14: qty 2

## 2023-06-14 MED ORDER — ACETAMINOPHEN 325 MG PO TABS
650.0000 mg | ORAL_TABLET | Freq: Once | ORAL | Status: AC
  Administered 2023-06-14: 650 mg via ORAL
  Filled 2023-06-14: qty 2

## 2023-06-14 NOTE — Patient Instructions (Signed)
[place] [facility]   Discharge Instructions: Thank you for choosing [facility] to provide your oncology and hematology care.   If you have a lab appointment with the Cancer Center, please go directly to the Cancer Center and check in at the registration area.   Wear comfortable clothing and clothing appropriate for easy access to any Portacath or PICC line.   We strive to give you quality time with your provider. You may need to reschedule your appointment if you arrive late (15 or more minutes).  Arriving late affects you and other patients whose appointments are after yours.  Also, if you miss three or more appointments without notifying the office, you may be dismissed from the clinic at the provider's discretion.      For prescription refill requests, have your pharmacy contact our office and allow 72 hours for refills to be completed.    Today you received the following chemotherapy and/or immunotherapy agents Rituximab-pvvr (RUXIENCE).      To help prevent nausea and vomiting after your treatment, we encourage you to take your nausea medication as directed.  BELOW ARE SYMPTOMS THAT SHOULD BE REPORTED IMMEDIATELY: *FEVER GREATER THAN 100.4 F (38 C) OR HIGHER *CHILLS OR SWEATING *NAUSEA AND VOMITING THAT IS NOT CONTROLLED WITH YOUR NAUSEA MEDICATION *UNUSUAL SHORTNESS OF BREATH *UNUSUAL BRUISING OR BLEEDING *URINARY PROBLEMS (pain or burning when urinating, or frequent urination) *BOWEL PROBLEMS (unusual diarrhea, constipation, pain near the anus) TENDERNESS IN MOUTH AND THROAT WITH OR WITHOUT PRESENCE OF ULCERS (sore throat, sores in mouth, or a toothache) UNUSUAL RASH, SWELLING OR PAIN  UNUSUAL VAGINAL DISCHARGE OR ITCHING   Items with * indicate a potential emergency and should be followed up as soon as possible or go to the Emergency Department if any problems should occur.  Please show the CHEMOTHERAPY ALERT CARD or IMMUNOTHERAPY  ALERT CARD at check-in to the Emergency Department and triage nurse.  Should you have questions after your visit or need to cancel or reschedule your appointment, please contact [place] [facility]  Dept: [phone]  and follow the prompts.  Office hours are 8:00 a.m. to 4:30 p.m. Monday - Friday. Please note that voicemails left after 4:00 p.m. may not be returned until the following business day.  We are closed weekends and major holidays. You have access to a nurse at all times for urgent questions. Please call the main number to the clinic Dept: [phone] and follow the prompts.   For any non-urgent questions, you may also contact your provider using MyChart. We now offer e-Visits for anyone 66 and older to request care online for non-urgent symptoms. For details visit [email or web address].   Also download the MyChart app! Go to the app store, search "MyChart", open the app, select , and log in with your MyChart username and password.  Rituximab Injection What is this medication? RITUXIMAB (ri TUX i mab) treats leukemia and lymphoma. It works by blocking a protein that causes cancer cells to grow and multiply. This helps to slow or stop the spread of cancer cells. It may also be used to treat autoimmune conditions, such as arthritis. It works by slowing down an overactive immune system. It is a monoclonal antibody. This medicine may be used for other purposes; ask your health care provider or pharmacist if you have questions. COMMON BRAND NAME(S): RIABNI, Rituxan, RUXIENCE, truxima What should I tell my care team before I take this medication? They need  to know if you have any of these conditions: Chest pain Heart disease Immune system problems Infection, such as chickenpox, cold sores, hepatitis B, herpes Irregular heartbeat or rhythm Kidney disease Low blood counts, such as low white cells, platelets, red cells Lung disease Recent or  upcoming vaccine An unusual or allergic reaction to rituximab, other medications, foods, dyes, or preservatives Pregnant or trying to get pregnant Breast-feeding How should I use this medication? This medication is injected into a vein. It is given by a care team in a hospital or clinic setting. A special MedGuide will be given to you before each treatment. Be sure to read this information carefully each time. Talk to your care team about the use of this medication in children. While this medication may be prescribed for children as young as 6 months for selected conditions, precautions do apply. Overdosage: If you think you have taken too much of this medicine contact a poison control center or emergency room at once. NOTE: This medicine is only for you. Do not share this medicine with others. What if I miss a dose? Keep appointments for follow-up doses. It is important not to miss your dose. Call your care team if you are unable to keep an appointment. What may interact with this medication? Do not take this medication with any of the following: Live vaccines This medication may also interact with the following: Cisplatin This list may not describe all possible interactions. Give your health care provider a list of all the medicines, herbs, non-prescription drugs, or dietary supplements you use. Also tell them if you smoke, drink alcohol, or use illegal drugs. Some items may interact with your medicine. What should I watch for while using this medication? Your condition will be monitored carefully while you are receiving this medication. You may need blood work while taking this medication. This medication can cause serious infusion reactions. To reduce the risk your care team may give you other medications to take before receiving this one. Be sure to follow the directions from your care team. This medication may increase your risk of getting an infection. Call your care team for advice if  you get a fever, chills, sore throat, or other symptoms of a cold or flu. Do not treat yourself. Try to avoid being around people who are sick. Call your care team if you are around anyone with measles, chickenpox, or if you develop sores or blisters that do not heal properly. Avoid taking medications that contain aspirin, acetaminophen, ibuprofen, naproxen, or ketoprofen unless instructed by your care team. These medications may hide a fever. This medication may cause serious skin reactions. They can happen weeks to months after starting the medication. Contact your care team right away if you notice fevers or flu-like symptoms with a rash. The rash may be red or purple and then turn into blisters or peeling of the skin. You may also notice a red rash with swelling of the face, lips, or lymph nodes in your neck or under your arms. In some patients, this medication may cause a serious brain infection that may cause death. If you have any problems seeing, thinking, speaking, walking, or standing, tell your care team right away. If you cannot reach your care team, urgently seek another source of medical care. Talk to your care team if you may be pregnant. Serious birth defects can occur if you take this medication during pregnancy and for 12 months after the last dose. You will need a negative pregnancy  test before starting this medication. Contraception is recommended while taking this medication and for 12 months after the last dose. Your care team can help you find the option that works for you. Do not breastfeed while taking this medication and for at least 6 months after the last dose. What side effects may I notice from receiving this medication? Side effects that you should report to your care team as soon as possible: Allergic reactions or angioedema--skin rash, itching or hives, swelling of the face, eyes, lips, tongue, arms, or legs, trouble swallowing or breathing Bowel blockage--stomach cramping,  unable to have a bowel movement or pass gas, loss of appetite, vomiting Dizziness, loss of balance or coordination, confusion or trouble speaking Heart attack--pain or tightness in the chest, shoulders, arms, or jaw, nausea, shortness of breath, cold or clammy skin, feeling faint or lightheaded Heart rhythm changes--fast or irregular heartbeat, dizziness, feeling faint or lightheaded, chest pain, trouble breathing Infection--fever, chills, cough, sore throat, wounds that don't heal, pain or trouble when passing urine, general feeling of discomfort or being unwell Infusion reactions--chest pain, shortness of breath or trouble breathing, feeling faint or lightheaded Kidney injury--decrease in the amount of urine, swelling of the ankles, hands, or feet Liver injury--right upper belly pain, loss of appetite, nausea, light-colored stool, dark yellow or brown urine, yellowing skin or eyes, unusual weakness or fatigue Redness, blistering, peeling, or loosening of the skin, including inside the mouth Stomach pain that is severe, does not go away, or gets worse Tumor lysis syndrome (TLS)--nausea, vomiting, diarrhea, decrease in the amount of urine, dark urine, unusual weakness or fatigue, confusion, muscle pain or cramps, fast or irregular heartbeat, joint pain Side effects that usually do not require medical attention (report to your care team if they continue or are bothersome): Headache Joint pain Nausea Runny or stuffy nose Unusual weakness or fatigue This list may not describe all possible side effects. Call your doctor for medical advice about side effects. You may report side effects to FDA at 1-800-FDA-1088. Where should I keep my medication? This medication is given in a hospital or clinic. It will not be stored at home. NOTE: This sheet is a summary. It may not cover all possible information. If you have questions about this medicine, talk to your doctor, pharmacist, or health care provider.   2024 Elsevier/Gold Standard (2022-04-13 00:00:00)

## 2023-06-14 NOTE — Progress Notes (Signed)
Hypersensitivity Reaction note  Date of event: 06/14/23  Time of event: 1105  Generic name of drug involved: Rituximab-pvvr (RUXIENCE).  Name of provider notified of the hypersensitivity reaction: Lonna Cobb, NP  Was agent that likely caused hypersensitivity reaction added to Allergies List within EMR? Yes  Chain of events including reaction signs/symptoms, treatment administered, and outcome (e.g., drug resumed; drug discontinued; sent to Emergency Department; etc.) 90 minutes into her Rituximab infusion, patient started complaining of nausea and vomiting. Infusion stopped immediately, 0.9% NS IVF started at 999 ml/hr, Pepcid 20mg  IVPB and Zofran 8 mg IV push administered. Patient monitored and infusion restarted. She tolerated the rest of the infusion without any problems. See flowsheet for Vitals and MAR for medication administration.  Lorelle Formosa, RN 06/14/2023 12:31 PM

## 2023-06-15 ENCOUNTER — Encounter: Payer: Self-pay | Admitting: Oncology

## 2023-06-15 ENCOUNTER — Telehealth: Payer: Self-pay

## 2023-06-15 ENCOUNTER — Ambulatory Visit: Payer: Medicaid Other | Admitting: Oncology

## 2023-06-15 ENCOUNTER — Ambulatory Visit: Payer: Medicaid Other | Admitting: Nurse Practitioner

## 2023-06-15 NOTE — Telephone Encounter (Signed)
24 HOUR CALL BACK  Telephone call to patient post her first time Rituximab infusion. Unable to reach patient and unable to leave a voice message.

## 2023-06-16 ENCOUNTER — Other Ambulatory Visit: Payer: Self-pay | Admitting: Oncology

## 2023-06-18 ENCOUNTER — Other Ambulatory Visit: Payer: Self-pay

## 2023-06-20 ENCOUNTER — Encounter: Payer: Self-pay | Admitting: Gastroenterology

## 2023-06-21 ENCOUNTER — Other Ambulatory Visit (HOSPITAL_COMMUNITY): Payer: Medicaid Other

## 2023-06-21 ENCOUNTER — Other Ambulatory Visit: Payer: Medicaid Other

## 2023-06-21 ENCOUNTER — Ambulatory Visit: Payer: Medicaid Other

## 2023-06-21 ENCOUNTER — Inpatient Hospital Stay: Payer: Medicaid Other | Admitting: Nurse Practitioner

## 2023-06-21 ENCOUNTER — Other Ambulatory Visit: Payer: Self-pay | Admitting: Oncology

## 2023-06-22 ENCOUNTER — Inpatient Hospital Stay: Payer: Medicaid Other

## 2023-06-22 ENCOUNTER — Encounter: Payer: Self-pay | Admitting: *Deleted

## 2023-06-22 ENCOUNTER — Other Ambulatory Visit: Payer: Self-pay | Admitting: *Deleted

## 2023-06-22 ENCOUNTER — Inpatient Hospital Stay: Payer: Medicaid Other | Admitting: Oncology

## 2023-06-22 VITALS — BP 124/73 | HR 61 | Temp 98.3°F | Resp 20

## 2023-06-22 DIAGNOSIS — C884 Extranodal marginal zone b-cell lymphoma of mucosa-associated lymphoid tissue (malt-lymphoma) not having achieved remission: Secondary | ICD-10-CM

## 2023-06-22 DIAGNOSIS — Z5112 Encounter for antineoplastic immunotherapy: Secondary | ICD-10-CM | POA: Diagnosis not present

## 2023-06-22 LAB — CBC WITH DIFFERENTIAL (CANCER CENTER ONLY)
Abs Immature Granulocytes: 0.01 10*3/uL (ref 0.00–0.07)
Basophils Absolute: 0 10*3/uL (ref 0.0–0.1)
Basophils Relative: 0 %
Eosinophils Absolute: 0.1 10*3/uL (ref 0.0–0.5)
Eosinophils Relative: 1 %
HCT: 41.3 % (ref 36.0–46.0)
Hemoglobin: 13.5 g/dL (ref 12.0–15.0)
Immature Granulocytes: 0 %
Lymphocytes Relative: 26 %
Lymphs Abs: 1.3 10*3/uL (ref 0.7–4.0)
MCH: 29.6 pg (ref 26.0–34.0)
MCHC: 32.7 g/dL (ref 30.0–36.0)
MCV: 90.6 fL (ref 80.0–100.0)
Monocytes Absolute: 0.5 10*3/uL (ref 0.1–1.0)
Monocytes Relative: 10 %
Neutro Abs: 3.2 10*3/uL (ref 1.7–7.7)
Neutrophils Relative %: 63 %
Platelet Count: 329 10*3/uL (ref 150–400)
RBC: 4.56 MIL/uL (ref 3.87–5.11)
RDW: 13.6 % (ref 11.5–15.5)
WBC Count: 5.1 10*3/uL (ref 4.0–10.5)
nRBC: 0 % (ref 0.0–0.2)

## 2023-06-22 LAB — CMP (CANCER CENTER ONLY)
ALT: 9 U/L (ref 0–44)
AST: 14 U/L — ABNORMAL LOW (ref 15–41)
Albumin: 4.5 g/dL (ref 3.5–5.0)
Alkaline Phosphatase: 70 U/L (ref 38–126)
Anion gap: 8 (ref 5–15)
BUN: 10 mg/dL (ref 6–20)
CO2: 30 mmol/L (ref 22–32)
Calcium: 10 mg/dL (ref 8.9–10.3)
Chloride: 104 mmol/L (ref 98–111)
Creatinine: 0.74 mg/dL (ref 0.44–1.00)
GFR, Estimated: >60 mL/min (ref >60)
Glucose, Bld: 96 mg/dL (ref 70–99)
Potassium: 3.6 mmol/L (ref 3.5–5.1)
Sodium: 142 mmol/L (ref 135–145)
Total Bilirubin: 0.4 mg/dL (ref 0.3–1.2)
Total Protein: 7.6 g/dL (ref 6.5–8.1)

## 2023-06-22 MED ORDER — ONDANSETRON HCL 8 MG PO TABS
8.0000 mg | ORAL_TABLET | Freq: Once | ORAL | Status: AC
  Administered 2023-06-22: 8 mg via ORAL

## 2023-06-22 MED ORDER — ACETAMINOPHEN 325 MG PO TABS
650.0000 mg | ORAL_TABLET | Freq: Once | ORAL | Status: AC
  Administered 2023-06-22: 650 mg via ORAL

## 2023-06-22 MED ORDER — SODIUM CHLORIDE 0.9 % IV SOLN
375.0000 mg/m2 | Freq: Once | INTRAVENOUS | Status: AC
  Administered 2023-06-22: 600 mg via INTRAVENOUS
  Filled 2023-06-22: qty 50

## 2023-06-22 MED ORDER — SODIUM CHLORIDE 0.9 % IV SOLN: Freq: Once | INTRAVENOUS | Status: AC

## 2023-06-22 MED ORDER — ONDANSETRON HCL 8 MG PO TABS: 4.0000 mg | ORAL_TABLET | Freq: Once | ORAL | Status: DC

## 2023-06-22 MED ORDER — DIPHENHYDRAMINE HCL 25 MG PO CAPS
50.0000 mg | ORAL_CAPSULE | Freq: Once | ORAL | Status: AC
  Administered 2023-06-22: 50 mg via ORAL

## 2023-06-22 NOTE — Patient Instructions (Signed)
[place] [facility]  Discharge Instructions: Thank you for choosing [facility] to provide your oncology and hematology care.   If you have a lab appointment with the Cancer Center, please go directly to the Cancer Center and check in at the registration area.   Wear comfortable clothing and clothing appropriate for easy access to any Portacath or PICC line.   We strive to give you quality time with your provider. You may need to reschedule your appointment if you arrive late (15 or more minutes).  Arriving late affects you and other patients whose appointments are after yours.  Also, if you miss three or more appointments without notifying the office, you may be dismissed from the clinic at the provider's discretion.      For prescription refill requests, have your pharmacy contact our office and allow 72 hours for refills to be completed.    Today you received the following chemotherapy and/or immunotherapy agents Rituxan      To help prevent nausea and vomiting after your treatment, we encourage you to take your nausea medication as directed.  BELOW ARE SYMPTOMS THAT SHOULD BE REPORTED IMMEDIATELY: *FEVER GREATER THAN 100.4 F (38 C) OR HIGHER *CHILLS OR SWEATING *NAUSEA AND VOMITING THAT IS NOT CONTROLLED WITH YOUR NAUSEA MEDICATION *UNUSUAL SHORTNESS OF BREATH *UNUSUAL BRUISING OR BLEEDING *URINARY PROBLEMS (pain or burning when urinating, or frequent urination) *BOWEL PROBLEMS (unusual diarrhea, constipation, pain near the anus) TENDERNESS IN MOUTH AND THROAT WITH OR WITHOUT PRESENCE OF ULCERS (sore throat, sores in mouth, or a toothache) UNUSUAL RASH, SWELLING OR PAIN  UNUSUAL VAGINAL DISCHARGE OR ITCHING   Items with * indicate a potential emergency and should be followed up as soon as possible or go to the Emergency Department if any problems should occur.  Please show the CHEMOTHERAPY ALERT CARD or IMMUNOTHERAPY ALERT CARD at check-in  to the Emergency Department and triage nurse.  Should you have questions after your visit or need to cancel or reschedule your appointment, please contact [place] [facility]  Dept: [phone]  and follow the prompts.  Office hours are 8:00 a.m. to 4:30 p.m. Monday - Friday. Please note that voicemails left after 4:00 p.m. may not be returned until the following business day.  We are closed weekends and major holidays. You have access to a nurse at all times for urgent questions. Please call the main number to the clinic Dept: [phone] and follow the prompts.   For any non-urgent questions, you may also contact your provider using MyChart. We now offer e-Visits for anyone 92 and older to request care online for non-urgent symptoms. For details visit [email or web address].   Also download the MyChart app! Go to the app store, search "MyChart", open the app, select [place], and log in with your MyChart username and password.

## 2023-06-22 NOTE — Progress Notes (Signed)
Nina Horn reports she does feel depressed and would like to talk to someone. She denies any suicidal ideations. Placed referral to CSW. Also placed referral to dietician for weight loss and poor appetite. Provided Ensure coupons at visit today.

## 2023-06-22 NOTE — Progress Notes (Signed)
Patient seen by Dr. Truett Perna today  Vitals are within treatment parameters.  Labs reviewed by Dr. Truett Perna and all are within treatment parameters--OK to treat  Per physician team, no modifications to treatment plan

## 2023-06-22 NOTE — Progress Notes (Signed)
[facility] OFFICE PROGRESS NOTE   Diagnosis: NHL  INTERVAL HISTORY:   Nina Horn completed cycle 1 rituximab on 7/11. She had an episode of nausea and vomiting during the infusion.  No other symptom of an allergic reaction.  The abdominal pain and nausea are improved.  She reports 1 episode of nausea and vomiting over the past week.  Objective:  Vital signs in last 24 hours:  Blood pressure 124/85, pulse 72, temperature 98.2 F (36.8 C), temperature source Oral, resp. rate 18, height 5\' 3"  (1.6 m), weight 113 lb 8 oz (51.5 kg), last menstrual period 02/04/2020, SpO2 100%, unknown if currently breastfeeding.    HEENT: mild white coat over the tongue Resp: lungs clear bilaterally Cardio: RRR GI: soft, nontender, no mass, no hsm Vascular: no leg edema   Lab Results:  Lab Results  Component Value Date   WBC 5.1 06/22/2023   HGB 13.5 06/22/2023   HCT 41.3 06/22/2023   MCV 90.6 06/22/2023   PLT 329 06/22/2023   NEUTROABS 3.2 06/22/2023    CMP  Lab Results  Component Value Date   NA 139 06/08/2023   K 3.9 06/08/2023   CL 104 06/08/2023   CO2 27 06/08/2023   GLUCOSE 98 06/08/2023   BUN 10 06/08/2023   CREATININE 0.68 06/08/2023   CALCIUM 8.6 (L) 06/08/2023   PROT 6.6 06/06/2023   ALBUMIN 3.6 06/06/2023   AST 13 (L) 06/06/2023   ALT 10 06/06/2023   ALKPHOS 72 06/06/2023   BILITOT 0.5 06/06/2023   GFRNONAA >60 06/08/2023   GFRAA >60 04/05/2020    No results found for: "CEA1", "CEA", "ZOX096", "CA125"  No results found for: "INR", "LABPROT"  Imaging:  No results found.  Medications: I have reviewed the patient's current medications.   Assessment/Plan: MALT lymphoma of the stomach 02/05/2023-EGD-2 nonbleeding gastric ulcers in the incisor and gastric antrum, largest measured 10 mm, congestion, discoloration, nodularity, ulceration, and inflammation in the gastric antrum.  Nodularity and congestion in the gastric body and lesser curvature,  gastric cardia, fundus, duodenum appeared normal. Pathology from the gastric antrum and "esophagus "revealed benign ulcerative gastritis with an atypical lymphoid infiltrate H. pylori negative consistent with a MALT lymphoma, B lymphocytes CD20 positive, cyclin D1 negative, B-cell gene rearrangement positive 04/09/2023-EGD-no endoscopic evidence of ulceration, post ulcer scarring, nodularity in the gastric body, prepyloric region and gastric antrum-biopsy revealed an atypical lymphoid infiltrate consistent with a lymphoid neoplasm CT abdomen/pelvis 05/25/2023-mild wall thickening at the lesser curvature of the distal gastric body, mild gastropathic ligament lymphadenopathy, soft tissue nodules in the gastrocolic ligament CT abdomen/pelvis 05/31/2023-unchanged distal gastric body wall thickening, gastrohepatic lymph nodes PET 06/11/2023-low-level metabolic activity associated with the gastric antrum, a gastrohepatic ligament lymph node, and a peritoneal nodule, normal spleen Cycle 1 rituximab 06/14/2023 Cycle 2 rituximab 06/22/23   2.  Nausea/vomiting and abdominal pain secondary to #1 3.  05/22/2023-left index finger ganglion cyst excision, left palmar mass excision-benign epidermal inclusion cyst 4.  Leukocytosis-likely secondary to tobacco use 5.  Tobacco use 6.  06/06/2023-readmission with intractable nausea and vomiting     Disposition: [name] appears stable.  She tolerated the first cycle of rituxan well.  She will complete cycle 2 today. The episode of n/v with cycle 1 was most likely not related to an allergic reaction.  She will return for cycle 3 in 1 week. She is scheduled for an office visit prior to cycle 4. She will call for new symptoms.  Thornton Papas, MD  06/22/2023  9:16 AM

## 2023-06-22 NOTE — Progress Notes (Signed)
[facility] Clinical Social Work  Clinical Social Work was referred by medical provider for assessment of psychosocial needs.  Clinical Social Worker contacted patient by phone to offer support and assess for needs.    Patient informed NP that she wished to speak with a counselor regarding her depression.  Patient denied feelings of self harm.  She was alert and oriented x3.  She stated she had "a lot of things going on."  Provided education regarding therapy since she had not received counseling before.  Plan is for CSW to call patient at 10am on Monday, 06/25/23.  CSW also provided the name, address and phone number for [facility].      Dorothey Baseman, LCSW  Clinical Social Worker [facility]        Patient is participating in a Managed Medicaid Plan:  Yes

## 2023-06-25 ENCOUNTER — Inpatient Hospital Stay: Payer: Medicaid Other

## 2023-06-25 NOTE — Progress Notes (Signed)
[facility] CSW Counseling Note  Patient was referred by medical provider. Treatment type: Individual  Presenting Concerns: Patient and/or family reports the following symptoms/concerns: depression Duration of problem: a few days; Severity of problem: mild   Orientation:oriented to person, place, time/date, situation, day of week, month of year, and year.   Affect: NA.  Session via telephone. Risk of harm to self or others: No plan to harm self or others  Patient and/or Family's Strengths/Protective Factors: Social connections, Social and Emotional competence, Concrete supports in place (healthy food, safe environments, etc.), and Sense of purposeActive sense of humor  Average or above average intelligence  Capable of independent living  Communication skills  General fund of knowledge  Supportive family/friends  Work skills      Goals Addressed: Patient will:  Reduce symptoms of: depression Increase knowledge and/or ability of: stress reduction  Increase healthy adjustment to current life circumstances   Progress towards Goals: Initial   Interventions: Interventions utilized:  Motivational Interviewing, Strength-based, and Supportive      Assessment: Patient currently experiencing increased stress.  Patient is a single mother of a 50 y/o and 6 y/o.  She has been experiencing financial strain because she does not work at [employer] during the summer.  She works in Personnel officer.  She received a scholarship last year from the [employer], but got sick and had to withdraw.  She was able to speak with her mother and friends over the weekend which helped improve her mood greatly.  She expressed having several mental health tools to help her.      Plan: Follow up with CSW: Patient to contact CSW as needed. Behavioral recommendations: Continue expressing feelings to support system. Referral(s): Adela Ports, LLS for $100 grant.  Mailed program information.       Pascal Lux Deniya Craigo,  LCSW   Patient is participating in a Managed Medicaid Plan:  Yes

## 2023-06-27 ENCOUNTER — Inpatient Hospital Stay: Payer: Medicaid Other | Admitting: Nutrition

## 2023-06-27 NOTE — Progress Notes (Signed)
Telephone consult completed with patient.  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 She is receiving Rituximab.  PMH includes Depression and migraines.  Medications include Zofran, Protonix, Mira lax, Senokot, Carafate, Scopolamine patch  Labs reviewed.  Height: 5'3". Weight: 113 pounds 8 oz on July 19. UBW: 127-132 pounds per patient BMI: 20.11.  Patient reports nausea after treatment which is now resolved. She endorses weight loss and says her appetite has been poor. She developed 2 ulcers which resulted in her making dietary changes. States this is when she lost most of the weight although it was unintentional. She ate a scrambled egg and toast at breakfast yesterday. She didn't eat much lunch, just cantaloupe and then ate a cheese steak sub and 2 cupcakes at dinner. Drank 2 bottles of Ensure (unclear which one). Says she tries to drink Ensure if she does not eat a meal. Currently, denies nausea, vomiting, constipation and diarrhea.  Nutrition Diagnosis: Unintended wt loss related to cancer and associated treatments as evidenced by 5% loss in less than one month.  Intervention: Educated to try smaller, more frequent meals and snacks. Continue Ensure Plus or equivalent 2-3 times daily. Diet choices as tolerated. Educated on eating to minimize side effects.  Monitoring, Evaluation, Goals: Tolerate adequate calories and protein to minimize wt loss.  Next Visit: Patient to contact RD with questions and concerns.

## 2023-06-28 ENCOUNTER — Inpatient Hospital Stay: Payer: Medicaid Other

## 2023-06-28 VITALS — BP 110/70 | HR 61 | Temp 98.1°F | Resp 18 | Ht 63.0 in

## 2023-06-28 DIAGNOSIS — C884 Extranodal marginal zone b-cell lymphoma of mucosa-associated lymphoid tissue (malt-lymphoma) not having achieved remission: Secondary | ICD-10-CM

## 2023-06-28 DIAGNOSIS — Z5112 Encounter for antineoplastic immunotherapy: Secondary | ICD-10-CM | POA: Diagnosis not present

## 2023-06-28 MED ORDER — SODIUM CHLORIDE 0.9 % IV SOLN: Freq: Once | INTRAVENOUS | Status: AC

## 2023-06-28 MED ORDER — SODIUM CHLORIDE 0.9 % IV SOLN
375.0000 mg/m2 | Freq: Once | INTRAVENOUS | Status: AC
  Administered 2023-06-28: 600 mg via INTRAVENOUS
  Filled 2023-06-28: qty 50

## 2023-06-28 MED ORDER — DIPHENHYDRAMINE HCL 25 MG PO CAPS
50.0000 mg | ORAL_CAPSULE | Freq: Once | ORAL | Status: AC
  Administered 2023-06-28: 50 mg via ORAL
  Filled 2023-06-28: qty 2

## 2023-06-28 MED ORDER — ACETAMINOPHEN 325 MG PO TABS
650.0000 mg | ORAL_TABLET | Freq: Once | ORAL | Status: AC
  Administered 2023-06-28: 650 mg via ORAL
  Filled 2023-06-28: qty 2

## 2023-06-28 MED ORDER — ONDANSETRON HCL 8 MG PO TABS
8.0000 mg | ORAL_TABLET | Freq: Once | ORAL | Status: AC
  Administered 2023-06-28: 8 mg via ORAL
  Filled 2023-06-28: qty 1

## 2023-06-28 NOTE — Progress Notes (Signed)
Pt requesting Zofran as premedication due to N/V with previous treatment. Dr Kalman Drape nurse [place] notified.

## 2023-06-28 NOTE — Patient Instructions (Signed)
[facility]   Discharge Instructions: Thank you for choosing [facility] to provide your oncology and hematology care.   If you have a lab appointment with the Cancer Center, please go directly to the Cancer Center and check in at the registration area.   Wear comfortable clothing and clothing appropriate for easy access to any Portacath or PICC line.   We strive to give you quality time with your provider. You may need to reschedule your appointment if you arrive late (15 or more minutes).  Arriving late affects you and other patients whose appointments are after yours.  Also, if you miss three or more appointments without notifying the office, you may be dismissed from the clinic at the provider's discretion.      For prescription refill requests, have your pharmacy contact our office and allow 72 hours for refills to be completed.    Today you received the following chemotherapy and/or immunotherapy agents Rituxan       To help prevent nausea and vomiting after your treatment, we encourage you to take your nausea medication as directed.  BELOW ARE SYMPTOMS THAT SHOULD BE REPORTED IMMEDIATELY: *FEVER GREATER THAN 100.4 F (38 C) OR HIGHER *CHILLS OR SWEATING *NAUSEA AND VOMITING THAT IS NOT CONTROLLED WITH YOUR NAUSEA MEDICATION *UNUSUAL SHORTNESS OF BREATH *UNUSUAL BRUISING OR BLEEDING *URINARY PROBLEMS (pain or burning when urinating, or frequent urination) *BOWEL PROBLEMS (unusual diarrhea, constipation, pain near the anus) TENDERNESS IN MOUTH AND THROAT WITH OR WITHOUT PRESENCE OF ULCERS (sore throat, sores in mouth, or a toothache) UNUSUAL RASH, SWELLING OR PAIN  UNUSUAL VAGINAL DISCHARGE OR ITCHING   Items with * indicate a potential emergency and should be followed up as soon as possible or go to the Emergency Department if any problems should occur.  Please show the CHEMOTHERAPY ALERT CARD or IMMUNOTHERAPY ALERT CARD at  check-in to the Emergency Department and triage nurse.  Should you have questions after your visit or need to cancel or reschedule your appointment, please contact [facility]  Dept: [phone]  and follow the prompts.  Office hours are 8:00 a.m. to 4:30 p.m. Monday - Friday. Please note that voicemails left after 4:00 p.m. may not be returned until the following business day.  We are closed weekends and major holidays. You have access to a nurse at all times for urgent questions. Please call the main number to the clinic Dept: [phone] and follow the prompts.   For any non-urgent questions, you may also contact your provider using MyChart. We now offer e-Visits for anyone 40 and older to request care online for non-urgent symptoms. For details visit [email or web address].   Also download the MyChart app! Go to the app store, search "MyChart", open the app, select [facility], and log in with your MyChart username and password.  Rituximab Injection What is this medication? RITUXIMAB (ri TUX i mab) treats leukemia and lymphoma. It works by blocking a protein that causes cancer cells to grow and multiply. This helps to slow or stop the spread of cancer cells. It may also be used to treat autoimmune conditions, such as arthritis. It works by slowing down an overactive immune system. It is a monoclonal antibody. This medicine may be used for other purposes; ask your health care provider or pharmacist if you have questions. COMMON BRAND NAME(S): RIABNI, Rituxan, RUXIENCE, truxima What should I tell my care team before I take this medication? They need  to know if you have any of these conditions: Chest pain Heart disease Immune system problems Infection, such as chickenpox, cold sores, hepatitis B, herpes Irregular heartbeat or rhythm Kidney disease Low blood counts, such as low white cells, platelets, red cells Lung disease Recent or upcoming vaccine An  unusual or allergic reaction to rituximab, other medications, foods, dyes, or preservatives Pregnant or trying to get pregnant Breast-feeding How should I use this medication? This medication is injected into a vein. It is given by a care team in a hospital or clinic setting. A special MedGuide will be given to you before each treatment. Be sure to read this information carefully each time. Talk to your care team about the use of this medication in children. While this medication may be prescribed for children as young as 6 months for selected conditions, precautions do apply. Overdosage: If you think you have taken too much of this medicine contact a poison control center or emergency room at once. NOTE: This medicine is only for you. Do not share this medicine with others. What if I miss a dose? Keep appointments for follow-up doses. It is important not to miss your dose. Call your care team if you are unable to keep an appointment. What may interact with this medication? Do not take this medication with any of the following: Live vaccines This medication may also interact with the following: Cisplatin This list may not describe all possible interactions. Give your health care provider a list of all the medicines, herbs, non-prescription drugs, or dietary supplements you use. Also tell them if you smoke, drink alcohol, or use illegal drugs. Some items may interact with your medicine. What should I watch for while using this medication? Your condition will be monitored carefully while you are receiving this medication. You may need blood work while taking this medication. This medication can cause serious infusion reactions. To reduce the risk your care team may give you other medications to take before receiving this one. Be sure to follow the directions from your care team. This medication may increase your risk of getting an infection. Call your care team for advice if you get a fever,  chills, sore throat, or other symptoms of a cold or flu. Do not treat yourself. Try to avoid being around people who are sick. Call your care team if you are around anyone with measles, chickenpox, or if you develop sores or blisters that do not heal properly. Avoid taking medications that contain aspirin, acetaminophen, ibuprofen, naproxen, or ketoprofen unless instructed by your care team. These medications may hide a fever. This medication may cause serious skin reactions. They can happen weeks to months after starting the medication. Contact your care team right away if you notice fevers or flu-like symptoms with a rash. The rash may be red or purple and then turn into blisters or peeling of the skin. You may also notice a red rash with swelling of the face, lips, or lymph nodes in your neck or under your arms. In some patients, this medication may cause a serious brain infection that may cause death. If you have any problems seeing, thinking, speaking, walking, or standing, tell your care team right away. If you cannot reach your care team, urgently seek another source of medical care. Talk to your care team if you may be pregnant. Serious birth defects can occur if you take this medication during pregnancy and for 12 months after the last dose. You will need a negative pregnancy  test before starting this medication. Contraception is recommended while taking this medication and for 12 months after the last dose. Your care team can help you find the option that works for you. Do not breastfeed while taking this medication and for at least 6 months after the last dose. What side effects may I notice from receiving this medication? Side effects that you should report to your care team as soon as possible: Allergic reactions or angioedema--skin rash, itching or hives, swelling of the face, eyes, lips, tongue, arms, or legs, trouble swallowing or breathing Bowel blockage--stomach cramping, unable to have a  bowel movement or pass gas, loss of appetite, vomiting Dizziness, loss of balance or coordination, confusion or trouble speaking Heart attack--pain or tightness in the chest, shoulders, arms, or jaw, nausea, shortness of breath, cold or clammy skin, feeling faint or lightheaded Heart rhythm changes--fast or irregular heartbeat, dizziness, feeling faint or lightheaded, chest pain, trouble breathing Infection--fever, chills, cough, sore throat, wounds that don't heal, pain or trouble when passing urine, general feeling of discomfort or being unwell Infusion reactions--chest pain, shortness of breath or trouble breathing, feeling faint or lightheaded Kidney injury--decrease in the amount of urine, swelling of the ankles, hands, or feet Liver injury--right upper belly pain, loss of appetite, nausea, light-colored stool, dark yellow or brown urine, yellowing skin or eyes, unusual weakness or fatigue Redness, blistering, peeling, or loosening of the skin, including inside the mouth Stomach pain that is severe, does not go away, or gets worse Tumor lysis syndrome (TLS)--nausea, vomiting, diarrhea, decrease in the amount of urine, dark urine, unusual weakness or fatigue, confusion, muscle pain or cramps, fast or irregular heartbeat, joint pain Side effects that usually do not require medical attention (report to your care team if they continue or are bothersome): Headache Joint pain Nausea Runny or stuffy nose Unusual weakness or fatigue This list may not describe all possible side effects. Call your doctor for medical advice about side effects. You may report side effects to FDA at 1-800-FDA-1088. Where should I keep my medication? This medication is given in a hospital or clinic. It will not be stored at home. NOTE: This sheet is a summary. It may not cover all possible information. If you have questions about this medicine, talk to your doctor, pharmacist, or health care provider.  2024  Elsevier/Gold Standard (2022-04-13 00:00:00)

## 2023-07-01 ENCOUNTER — Other Ambulatory Visit: Payer: Self-pay | Admitting: Oncology

## 2023-07-05 ENCOUNTER — Encounter: Payer: Self-pay | Admitting: *Deleted

## 2023-07-05 ENCOUNTER — Inpatient Hospital Stay: Payer: Medicaid Other

## 2023-07-05 ENCOUNTER — Inpatient Hospital Stay: Payer: Medicaid Other | Attending: Oncology | Admitting: Oncology

## 2023-07-05 VITALS — BP 117/77 | HR 62 | Temp 98.1°F | Resp 18

## 2023-07-05 DIAGNOSIS — C884 Extranodal marginal zone b-cell lymphoma of mucosa-associated lymphoid tissue (malt-lymphoma) not having achieved remission: Secondary | ICD-10-CM

## 2023-07-05 DIAGNOSIS — Z79899 Other long term (current) drug therapy: Secondary | ICD-10-CM | POA: Insufficient documentation

## 2023-07-05 DIAGNOSIS — Z5112 Encounter for antineoplastic immunotherapy: Secondary | ICD-10-CM | POA: Diagnosis present

## 2023-07-05 DIAGNOSIS — F17210 Nicotine dependence, cigarettes, uncomplicated: Secondary | ICD-10-CM | POA: Diagnosis not present

## 2023-07-05 LAB — CMP (CANCER CENTER ONLY)
ALT: 6 U/L (ref 0–44)
AST: 13 U/L — ABNORMAL LOW (ref 15–41)
Albumin: 4.2 g/dL (ref 3.5–5.0)
Alkaline Phosphatase: 72 U/L (ref 38–126)
Anion gap: 7 (ref 5–15)
BUN: 9 mg/dL (ref 6–20)
CO2: 29 mmol/L (ref 22–32)
Calcium: 9.5 mg/dL (ref 8.9–10.3)
Chloride: 105 mmol/L (ref 98–111)
Creatinine: 0.76 mg/dL (ref 0.44–1.00)
GFR, Estimated: >60 mL/min (ref >60)
Glucose, Bld: 116 mg/dL — ABNORMAL HIGH (ref 70–99)
Potassium: 3.5 mmol/L (ref 3.5–5.1)
Sodium: 141 mmol/L (ref 135–145)
Total Bilirubin: 0.3 mg/dL (ref 0.3–1.2)
Total Protein: 6.7 g/dL (ref 6.5–8.1)

## 2023-07-05 LAB — CBC WITH DIFFERENTIAL (CANCER CENTER ONLY)
Abs Immature Granulocytes: 0.01 10*3/uL (ref 0.00–0.07)
Basophils Absolute: 0 10*3/uL (ref 0.0–0.1)
Basophils Relative: 0 %
Eosinophils Absolute: 0.1 10*3/uL (ref 0.0–0.5)
Eosinophils Relative: 2 %
HCT: 39.9 % (ref 36.0–46.0)
Hemoglobin: 13.2 g/dL (ref 12.0–15.0)
Immature Granulocytes: 0 %
Lymphocytes Relative: 40 %
Lymphs Abs: 2 10*3/uL (ref 0.7–4.0)
MCH: 30.3 pg (ref 26.0–34.0)
MCHC: 33.1 g/dL (ref 30.0–36.0)
MCV: 91.7 fL (ref 80.0–100.0)
Monocytes Absolute: 0.5 10*3/uL (ref 0.1–1.0)
Monocytes Relative: 10 %
Neutro Abs: 2.3 10*3/uL (ref 1.7–7.7)
Neutrophils Relative %: 48 %
Platelet Count: 236 10*3/uL (ref 150–400)
RBC: 4.35 MIL/uL (ref 3.87–5.11)
RDW: 13.9 % (ref 11.5–15.5)
WBC Count: 4.9 10*3/uL (ref 4.0–10.5)
nRBC: 0.4 % — ABNORMAL HIGH (ref 0.0–0.2)

## 2023-07-05 MED ORDER — DIPHENHYDRAMINE HCL 25 MG PO CAPS
50.0000 mg | ORAL_CAPSULE | Freq: Once | ORAL | Status: AC
  Administered 2023-07-05: 50 mg via ORAL
  Filled 2023-07-05: qty 2

## 2023-07-05 MED ORDER — SODIUM CHLORIDE 0.9 % IV SOLN
375.0000 mg/m2 | Freq: Once | INTRAVENOUS | Status: AC
  Administered 2023-07-05: 600 mg via INTRAVENOUS
  Filled 2023-07-05: qty 50

## 2023-07-05 MED ORDER — SODIUM CHLORIDE 0.9 % IV SOLN
375.0000 mg/m2 | Freq: Once | INTRAVENOUS | Status: DC
  Filled 2023-07-05: qty 60

## 2023-07-05 MED ORDER — ONDANSETRON HCL 8 MG PO TABS
8.0000 mg | ORAL_TABLET | Freq: Once | ORAL | Status: AC
  Administered 2023-07-05: 8 mg via ORAL
  Filled 2023-07-05: qty 1

## 2023-07-05 MED ORDER — SODIUM CHLORIDE 0.9 % IV SOLN: Freq: Once | INTRAVENOUS | Status: AC

## 2023-07-05 MED ORDER — ACETAMINOPHEN 325 MG PO TABS
650.0000 mg | ORAL_TABLET | Freq: Once | ORAL | Status: AC
  Administered 2023-07-05: 650 mg via ORAL
  Filled 2023-07-05: qty 2

## 2023-07-05 NOTE — Patient Instructions (Signed)
[facility]   Discharge Instructions: Thank you for choosing [facility] to provide your oncology and hematology care.   If you have a lab appointment with the Cancer Center, please go directly to the Cancer Center and check in at the registration area.   Wear comfortable clothing and clothing appropriate for easy access to any Portacath or PICC line.   We strive to give you quality time with your provider. You may need to reschedule your appointment if you arrive late (15 or more minutes).  Arriving late affects you and other patients whose appointments are after yours.  Also, if you miss three or more appointments without notifying the office, you may be dismissed from the clinic at the provider's discretion.      For prescription refill requests, have your pharmacy contact our office and allow 72 hours for refills to be completed.    Today you received the following chemotherapy and/or immunotherapy agents Rituxan       To help prevent nausea and vomiting after your treatment, we encourage you to take your nausea medication as directed.  BELOW ARE SYMPTOMS THAT SHOULD BE REPORTED IMMEDIATELY: *FEVER GREATER THAN 100.4 F (38 C) OR HIGHER *CHILLS OR SWEATING *NAUSEA AND VOMITING THAT IS NOT CONTROLLED WITH YOUR NAUSEA MEDICATION *UNUSUAL SHORTNESS OF BREATH *UNUSUAL BRUISING OR BLEEDING *URINARY PROBLEMS (pain or burning when urinating, or frequent urination) *BOWEL PROBLEMS (unusual diarrhea, constipation, pain near the anus) TENDERNESS IN MOUTH AND THROAT WITH OR WITHOUT PRESENCE OF ULCERS (sore throat, sores in mouth, or a toothache) UNUSUAL RASH, SWELLING OR PAIN  UNUSUAL VAGINAL DISCHARGE OR ITCHING   Items with * indicate a potential emergency and should be followed up as soon as possible or go to the Emergency Department if any problems should occur.  Please show the CHEMOTHERAPY ALERT CARD or IMMUNOTHERAPY ALERT CARD at  check-in to the Emergency Department and triage nurse.  Should you have questions after your visit or need to cancel or reschedule your appointment, please contact [facility]  Dept: [phone]  and follow the prompts.  Office hours are 8:00 a.m. to 4:30 p.m. Monday - Friday. Please note that voicemails left after 4:00 p.m. may not be returned until the following business day.  We are closed weekends and major holidays. You have access to a nurse at all times for urgent questions. Please call the main number to the clinic Dept: [phone] and follow the prompts.   For any non-urgent questions, you may also contact your provider using MyChart. We now offer e-Visits for anyone 80 and older to request care online for non-urgent symptoms. For details visit [email or web address].   Also download the MyChart app! Go to the app store, search "MyChart", open the app, select [place], and log in with your MyChart username and password.  Rituximab Injection What is this medication? RITUXIMAB (ri TUX i mab) treats leukemia and lymphoma. It works by blocking a protein that causes cancer cells to grow and multiply. This helps to slow or stop the spread of cancer cells. It may also be used to treat autoimmune conditions, such as arthritis. It works by slowing down an overactive immune system. It is a monoclonal antibody. This medicine may be used for other purposes; ask your health care provider or pharmacist if you have questions. COMMON BRAND NAME(S): RIABNI, Rituxan, RUXIENCE, truxima What should I tell my care team before I take this medication? They need  to know if you have any of these conditions: Chest pain Heart disease Immune system problems Infection, such as chickenpox, cold sores, hepatitis B, herpes Irregular heartbeat or rhythm Kidney disease Low blood counts, such as low white cells, platelets, red cells Lung disease Recent or upcoming vaccine An  unusual or allergic reaction to rituximab, other medications, foods, dyes, or preservatives Pregnant or trying to get pregnant Breast-feeding How should I use this medication? This medication is injected into a vein. It is given by a care team in a hospital or clinic setting. A special MedGuide will be given to you before each treatment. Be sure to read this information carefully each time. Talk to your care team about the use of this medication in children. While this medication may be prescribed for children as young as 6 months for selected conditions, precautions do apply. Overdosage: If you think you have taken too much of this medicine contact a poison control center or emergency room at once. NOTE: This medicine is only for you. Do not share this medicine with others. What if I miss a dose? Keep appointments for follow-up doses. It is important not to miss your dose. Call your care team if you are unable to keep an appointment. What may interact with this medication? Do not take this medication with any of the following: Live vaccines This medication may also interact with the following: Cisplatin This list may not describe all possible interactions. Give your health care provider a list of all the medicines, herbs, non-prescription drugs, or dietary supplements you use. Also tell them if you smoke, drink alcohol, or use illegal drugs. Some items may interact with your medicine. What should I watch for while using this medication? Your condition will be monitored carefully while you are receiving this medication. You may need blood work while taking this medication. This medication can cause serious infusion reactions. To reduce the risk your care team may give you other medications to take before receiving this one. Be sure to follow the directions from your care team. This medication may increase your risk of getting an infection. Call your care team for advice if you get a fever,  chills, sore throat, or other symptoms of a cold or flu. Do not treat yourself. Try to avoid being around people who are sick. Call your care team if you are around anyone with measles, chickenpox, or if you develop sores or blisters that do not heal properly. Avoid taking medications that contain aspirin, acetaminophen, ibuprofen, naproxen, or ketoprofen unless instructed by your care team. These medications may hide a fever. This medication may cause serious skin reactions. They can happen weeks to months after starting the medication. Contact your care team right away if you notice fevers or flu-like symptoms with a rash. The rash may be red or purple and then turn into blisters or peeling of the skin. You may also notice a red rash with swelling of the face, lips, or lymph nodes in your neck or under your arms. In some patients, this medication may cause a serious brain infection that may cause death. If you have any problems seeing, thinking, speaking, walking, or standing, tell your care team right away. If you cannot reach your care team, urgently seek another source of medical care. Talk to your care team if you may be pregnant. Serious birth defects can occur if you take this medication during pregnancy and for 12 months after the last dose. You will need a negative pregnancy  test before starting this medication. Contraception is recommended while taking this medication and for 12 months after the last dose. Your care team can help you find the option that works for you. Do not breastfeed while taking this medication and for at least 6 months after the last dose. What side effects may I notice from receiving this medication? Side effects that you should report to your care team as soon as possible: Allergic reactions or angioedema--skin rash, itching or hives, swelling of the face, eyes, lips, tongue, arms, or legs, trouble swallowing or breathing Bowel blockage--stomach cramping, unable to have a  bowel movement or pass gas, loss of appetite, vomiting Dizziness, loss of balance or coordination, confusion or trouble speaking Heart attack--pain or tightness in the chest, shoulders, arms, or jaw, nausea, shortness of breath, cold or clammy skin, feeling faint or lightheaded Heart rhythm changes--fast or irregular heartbeat, dizziness, feeling faint or lightheaded, chest pain, trouble breathing Infection--fever, chills, cough, sore throat, wounds that don't heal, pain or trouble when passing urine, general feeling of discomfort or being unwell Infusion reactions--chest pain, shortness of breath or trouble breathing, feeling faint or lightheaded Kidney injury--decrease in the amount of urine, swelling of the ankles, hands, or feet Liver injury--right upper belly pain, loss of appetite, nausea, light-colored stool, dark yellow or brown urine, yellowing skin or eyes, unusual weakness or fatigue Redness, blistering, peeling, or loosening of the skin, including inside the mouth Stomach pain that is severe, does not go away, or gets worse Tumor lysis syndrome (TLS)--nausea, vomiting, diarrhea, decrease in the amount of urine, dark urine, unusual weakness or fatigue, confusion, muscle pain or cramps, fast or irregular heartbeat, joint pain Side effects that usually do not require medical attention (report to your care team if they continue or are bothersome): Headache Joint pain Nausea Runny or stuffy nose Unusual weakness or fatigue This list may not describe all possible side effects. Call your doctor for medical advice about side effects. You may report side effects to FDA at 1-800-FDA-1088. Where should I keep my medication? This medication is given in a hospital or clinic. It will not be stored at home. NOTE: This sheet is a summary. It may not cover all possible information. If you have questions about this medicine, talk to your doctor, pharmacist, or health care provider.  2024  Elsevier/Gold Standard (2022-04-13 00:00:00)

## 2023-07-05 NOTE — Progress Notes (Signed)
Patient seen by Dr. Sherrill today ? ?Vitals are within treatment parameters. ? ?Labs reviewed by Dr. Sherrill and are within treatment parameters. ? ?Per physician team, patient is ready for treatment and there are NO modifications to the treatment plan.  ?

## 2023-07-05 NOTE — Progress Notes (Signed)
[facility] CSW Progress Note  Clinical Child psychotherapist phoned patient per the request of Kem Boroughs.  Patient stated she is experiencing financial strain.  Discussed grants and contacted Merla Riches regarding the [employer].  Also faxed grant application to the [employer] per patients request.  Informed her she could obtain a bag of food at the [facility]  She stated she was feeling well and had no other needs.    Dorothey Baseman, LCSW Clinical Social Worker [facility]    Patient is participating in a Managed Medicaid Plan:  Yes

## 2023-07-05 NOTE — Progress Notes (Signed)
  [facility] OFFICE PROGRESS NOTE   Diagnosis: Gastric MALT lymphoma  INTERVAL HISTORY:   Nina Horn completed cycle 3 rituximab on 06/28/2023.  No nausea or symptom of allergic reaction.  In general her nausea is much improved.  She is eating.  Abdominal pain has improved.  Objective:  Vital signs in last 24 hours:  Blood pressure 124/84, pulse 71, temperature 98.1 F (36.7 C), temperature source Oral, resp. rate 18, height 5\' 3"  (1.6 m), weight 116 lb (52.6 kg), last menstrual period 02/04/2020, SpO2 100%, unknown if currently breastfeeding.    HEENT: No thrush Resp: Lungs clear bilaterally Cardio: Regular rate and rhythm GI: No hepatosplenomegaly, nontender, no mass Vascular: No leg edema  Lab Results:  Lab Results  Component Value Date   WBC 4.9 07/05/2023   HGB 13.2 07/05/2023   HCT 39.9 07/05/2023   MCV 91.7 07/05/2023   PLT 236 07/05/2023   NEUTROABS 2.3 07/05/2023    CMP  Lab Results  Component Value Date   NA 142 06/22/2023   K 3.6 06/22/2023   CL 104 06/22/2023   CO2 30 06/22/2023   GLUCOSE 96 06/22/2023   BUN 10 06/22/2023   CREATININE 0.74 06/22/2023   CALCIUM 10.0 06/22/2023   PROT 7.6 06/22/2023   ALBUMIN 4.5 06/22/2023   AST 14 (L) 06/22/2023   ALT 9 06/22/2023   ALKPHOS 70 06/22/2023   BILITOT 0.4 06/22/2023   GFRNONAA >60 06/22/2023   GFRAA >60 04/05/2020    Medications: I have reviewed the patient's current medications.   Assessment/Plan: MALT lymphoma of the stomach 02/05/2023-EGD-2 nonbleeding gastric ulcers in the incisor and gastric antrum, largest measured 10 mm, congestion, discoloration, nodularity, ulceration, and inflammation in the gastric antrum.  Nodularity and congestion in the gastric body and lesser curvature, gastric cardia, fundus, duodenum appeared normal. Pathology from the gastric antrum and "esophagus "revealed benign ulcerative gastritis with an atypical lymphoid infiltrate H. pylori negative  consistent with a MALT lymphoma, B lymphocytes CD20 positive, cyclin D1 negative, B-cell gene rearrangement positive 04/09/2023-EGD-no endoscopic evidence of ulceration, post ulcer scarring, nodularity in the gastric body, prepyloric region and gastric antrum-biopsy revealed an atypical lymphoid infiltrate consistent with a lymphoid neoplasm CT abdomen/pelvis 05/25/2023-mild wall thickening at the lesser curvature of the distal gastric body, mild gastropathic ligament lymphadenopathy, soft tissue nodules in the gastrocolic ligament CT abdomen/pelvis 05/31/2023-unchanged distal gastric body wall thickening, gastrohepatic lymph nodes PET 06/11/2023-low-level metabolic activity associated with the gastric antrum, a gastrohepatic ligament lymph node, and a peritoneal nodule, normal spleen Cycle 1 rituximab 06/14/2023 Cycle 2 rituximab 06/22/23 Cycle 3 rituximab 06/28/2023 Cycle 4 rituximab 07/05/2023   2.  Nausea/vomiting and abdominal pain secondary to #1 3.  05/22/2023-left index finger ganglion cyst excision, left palmar mass excision-benign epidermal inclusion cyst 4.  Leukocytosis-likely secondary to tobacco use 5.  Tobacco use 6.  06/06/2023-readmission with intractable nausea and vomiting     Disposition: Nina Horn has completed 3 treatments with rituximab.  She has tolerated the treatment well.  Nausea and abdominal pain are much improved.  She will complete a final treatment with rituximab today.  She will return for an office and lab visit in 3 to 4 weeks.  We will plan for a restaging CT abdomen/pelvis in 2-3 months.  She will discontinue the scopolamine patch and wean the sucralfate to off as tolerated.  She continues Protonix.  Thornton Papas, MD  07/05/2023  8:55 AM

## 2023-07-07 ENCOUNTER — Other Ambulatory Visit: Payer: Self-pay

## 2023-08-02 ENCOUNTER — Inpatient Hospital Stay: Payer: Medicaid Other | Admitting: Oncology

## 2023-08-02 ENCOUNTER — Inpatient Hospital Stay: Payer: Medicaid Other

## 2023-08-02 ENCOUNTER — Telehealth: Payer: Self-pay

## 2023-08-02 NOTE — Telephone Encounter (Signed)
Patient was a no show for labs and appointment on 08/02/23, tried to call to get the appointment reschedule<- no answer and voicemail not set up 08/02/23 @ 9:47AM-kjones

## 2023-08-07 ENCOUNTER — Inpatient Hospital Stay: Payer: Medicaid Other | Attending: Oncology | Admitting: Licensed Clinical Social Worker

## 2023-08-08 ENCOUNTER — Other Ambulatory Visit: Payer: Self-pay

## 2023-09-04 ENCOUNTER — Inpatient Hospital Stay: Payer: Medicaid Other | Attending: Oncology

## 2023-09-04 ENCOUNTER — Inpatient Hospital Stay (HOSPITAL_BASED_OUTPATIENT_CLINIC_OR_DEPARTMENT_OTHER): Payer: Medicaid Other | Admitting: Nurse Practitioner

## 2023-09-04 ENCOUNTER — Encounter: Payer: Self-pay | Admitting: Nurse Practitioner

## 2023-09-04 VITALS — BP 111/73 | HR 73 | Temp 98.4°F | Resp 16 | Ht 63.0 in | Wt 127.0 lb

## 2023-09-04 DIAGNOSIS — C884 Extranodal marginal zone b-cell lymphoma of mucosa-associated lymphoid tissue (malt-lymphoma) not having achieved remission: Secondary | ICD-10-CM

## 2023-09-04 LAB — CBC WITH DIFFERENTIAL (CANCER CENTER ONLY)
Abs Immature Granulocytes: 0.01 10*3/uL (ref 0.00–0.07)
Basophils Absolute: 0 10*3/uL (ref 0.0–0.1)
Basophils Relative: 1 %
Eosinophils Absolute: 0.1 10*3/uL (ref 0.0–0.5)
Eosinophils Relative: 1 %
HCT: 36.9 % (ref 36.0–46.0)
Hemoglobin: 12.4 g/dL (ref 12.0–15.0)
Immature Granulocytes: 0 %
Lymphocytes Relative: 36 %
Lymphs Abs: 2 10*3/uL (ref 0.7–4.0)
MCH: 30.8 pg (ref 26.0–34.0)
MCHC: 33.6 g/dL (ref 30.0–36.0)
MCV: 91.6 fL (ref 80.0–100.0)
Monocytes Absolute: 0.6 10*3/uL (ref 0.1–1.0)
Monocytes Relative: 11 %
Neutro Abs: 2.8 10*3/uL (ref 1.7–7.7)
Neutrophils Relative %: 51 %
Platelet Count: 257 10*3/uL (ref 150–400)
RBC: 4.03 MIL/uL (ref 3.87–5.11)
RDW: 13.6 % (ref 11.5–15.5)
WBC Count: 5.5 10*3/uL (ref 4.0–10.5)
nRBC: 0 % (ref 0.0–0.2)

## 2023-09-04 LAB — CMP (CANCER CENTER ONLY)
ALT: 6 U/L (ref 0–44)
AST: 12 U/L — ABNORMAL LOW (ref 15–41)
Albumin: 4.3 g/dL (ref 3.5–5.0)
Alkaline Phosphatase: 59 U/L (ref 38–126)
Anion gap: 8 (ref 5–15)
BUN: 11 mg/dL (ref 6–20)
CO2: 27 mmol/L (ref 22–32)
Calcium: 9.4 mg/dL (ref 8.9–10.3)
Chloride: 105 mmol/L (ref 98–111)
Creatinine: 0.75 mg/dL (ref 0.44–1.00)
GFR, Estimated: >60 mL/min (ref >60)
Glucose, Bld: 102 mg/dL — ABNORMAL HIGH (ref 70–99)
Potassium: 3.7 mmol/L (ref 3.5–5.1)
Sodium: 140 mmol/L (ref 135–145)
Total Bilirubin: 0.3 mg/dL (ref 0.3–1.2)
Total Protein: 6.9 g/dL (ref 6.5–8.1)

## 2023-09-04 LAB — LACTATE DEHYDROGENASE: LDH: 117 U/L (ref 98–192)

## 2023-09-04 NOTE — Progress Notes (Signed)
  Nina Horn OFFICE PROGRESS NOTE   Diagnosis: Gastric MALT lymphoma  INTERVAL HISTORY:   Nina Horn returns for follow-up.  She completed cycle 4 weekly rituximab 07/05/2023.  The nausea and vomiting have resolved.  Appetite has improved.  She is gaining weight.  She denies pain.  Objective:  Vital signs in last 24 hours:  Blood pressure 111/73, pulse 73, temperature 98.4 F (36.9 C), temperature source Oral, resp. rate 16, height 5\' 3"  (1.6 m), weight 127 lb (57.6 kg), last menstrual period 02/04/2020, SpO2 100%, unknown if currently breastfeeding.    Lymphatics: No palpable cervical, supraclavicular or axillary lymph nodes. Resp: Lungs clear bilaterally. Cardio: Regular rate and rhythm. GI: Abdomen soft and nontender.  No hepatosplenomegaly.  No mass. Vascular: No leg edema.   Lab Results:  Lab Results  Component Value Date   WBC 5.5 09/04/2023   HGB 12.4 09/04/2023   HCT 36.9 09/04/2023   MCV 91.6 09/04/2023   PLT 257 09/04/2023   NEUTROABS 2.8 09/04/2023    Imaging:  No results found.  Medications: I have reviewed the patient's current medications.  Assessment/Plan: MALT lymphoma of the stomach 02/05/2023-EGD-2 nonbleeding gastric ulcers in the incisor and gastric antrum, largest measured 10 mm, congestion, discoloration, nodularity, ulceration, and inflammation in the gastric antrum.  Nodularity and congestion in the gastric body and lesser curvature, gastric cardia, fundus, duodenum appeared normal. Pathology from the gastric antrum and "esophagus "revealed benign ulcerative gastritis with an atypical lymphoid infiltrate H. pylori negative consistent with a MALT lymphoma, B lymphocytes CD20 positive, cyclin D1 negative, B-cell gene rearrangement positive 04/09/2023-EGD-no endoscopic evidence of ulceration, post ulcer scarring, nodularity in the gastric body, prepyloric region and gastric antrum-biopsy revealed an atypical lymphoid infiltrate  consistent with a lymphoid neoplasm CT abdomen/pelvis 05/25/2023-mild wall thickening at the lesser curvature of the distal gastric body, mild gastropathic ligament lymphadenopathy, soft tissue nodules in the gastrocolic ligament CT abdomen/pelvis 05/31/2023-unchanged distal gastric body wall thickening, gastrohepatic lymph nodes PET 06/11/2023-low-level metabolic activity associated with the gastric antrum, a gastrohepatic ligament lymph node, and a peritoneal nodule, normal spleen Cycle 1 rituximab 06/14/2023 Cycle 2 rituximab 06/22/23 Cycle 3 rituximab 06/28/2023 Cycle 4 rituximab 07/05/2023   2.  Nausea/vomiting and abdominal pain secondary to #1 3.  05/22/2023-left index finger ganglion cyst excision, left palmar mass excision-benign epidermal inclusion cyst 4.  Leukocytosis-likely secondary to tobacco use 5.  Tobacco use 6.  06/06/2023-readmission with intractable nausea and vomiting  Disposition: Nina Horn appears stable.  She completed the 4-week course of rituximab about 2 months ago.  The nausea and vomiting have resolved.  She is gaining weight.  Plan for restaging CTs in approximately 6 weeks.  CBC and chemistry panel reviewed.  Labs are stable.  She will return for follow-up 1 week after the above CTs.  We are available to see her sooner if needed.    Nina Horn ANP/GNP-BC   09/04/2023  3:22 PM

## 2023-09-05 ENCOUNTER — Other Ambulatory Visit: Payer: Self-pay

## 2023-09-17 ENCOUNTER — Encounter: Payer: Self-pay | Admitting: Oncology

## 2023-09-17 NOTE — Telephone Encounter (Signed)
error 

## 2023-10-12 ENCOUNTER — Other Ambulatory Visit: Payer: Self-pay | Admitting: Physician Assistant

## 2023-10-12 DIAGNOSIS — N6320 Unspecified lump in the left breast, unspecified quadrant: Secondary | ICD-10-CM

## 2023-10-13 ENCOUNTER — Ambulatory Visit (HOSPITAL_BASED_OUTPATIENT_CLINIC_OR_DEPARTMENT_OTHER): Admission: RE | Admit: 2023-10-13 | Payer: Medicaid Other | Source: Ambulatory Visit

## 2023-10-14 ENCOUNTER — Other Ambulatory Visit: Payer: Self-pay

## 2023-10-15 ENCOUNTER — Encounter: Payer: Self-pay | Admitting: Oncology

## 2023-10-15 ENCOUNTER — Ambulatory Visit
Admission: RE | Admit: 2023-10-15 | Discharge: 2023-10-15 | Disposition: A | Discharge status: Home / Self Care | Payer: Medicaid Other | Source: Ambulatory Visit | Attending: Physician Assistant | Admitting: Physician Assistant

## 2023-10-15 DIAGNOSIS — N6320 Unspecified lump in the left breast, unspecified quadrant: Secondary | ICD-10-CM

## 2023-10-18 ENCOUNTER — Inpatient Hospital Stay: Payer: Medicaid Other | Admitting: Oncology

## 2023-10-18 ENCOUNTER — Inpatient Hospital Stay: Payer: Medicaid Other | Attending: Oncology

## 2023-10-18 ENCOUNTER — Telehealth: Payer: Self-pay | Admitting: *Deleted

## 2023-10-18 NOTE — Progress Notes (Signed)
[facility] Clinical Social Work  Clinical Social Work was referred by  [facility] Admin  to discuss [employer]. Clinical Social Worker contacted patient by phone to offer support and assess for needs. Patient reported attempts of reaching contact to assist with [place] Application while in active treatment. CSW consulted with [facility], patient meets presumptive eligibility (SNAP benefits). CSW sent referral to Patient Financial Resource Specialist T. Chilton Si, to follow up with patient.   Follow up scheduled via telephone with patient and CSW on  11/21  Marguerita Merles, [place] Clinical Social Worker [facility]

## 2023-10-18 NOTE — Telephone Encounter (Signed)
Called patient w/CT appointment and lab for 11/05/23. Mailed appointment calendar to her home as well.

## 2023-10-19 ENCOUNTER — Telehealth: Payer: Self-pay | Admitting: Oncology

## 2023-10-19 ENCOUNTER — Other Ambulatory Visit: Payer: Self-pay

## 2023-10-19 NOTE — Telephone Encounter (Signed)
I spoke w pt and she qualifies for the grant and will bring in the necessary documents in for completion.

## 2023-10-24 ENCOUNTER — Telehealth: Payer: Self-pay | Admitting: Oncology

## 2023-10-24 ENCOUNTER — Telehealth: Payer: Self-pay

## 2023-10-24 NOTE — Telephone Encounter (Signed)
[facility] CSW Progress Note  Clinical Child psychotherapist contacted patient by phone as planned to follow up with conversation about [employer]. Patient confirmed approval of [employer] Funding and expressed challenges with faxing documents to Patient Financial Specialist T. Chilton Si and requested direct contact. CSW messaged patient financial specialist T. Green via secure chat, requesting guidance on matter.   Marguerita Merles, LCSW Clinical Social Worker [facility]

## 2023-10-24 NOTE — Telephone Encounter (Signed)
[employer] GRANT APPROVED for $1000 FAMILY SIZE:  3 HH INC:  100% 250% [facility] Annual Met APPROVAL DATE: 10/24/2023

## 2023-10-25 ENCOUNTER — Other Ambulatory Visit: Payer: Medicaid Other

## 2023-10-25 ENCOUNTER — Encounter: Payer: Self-pay | Admitting: Oncology

## 2023-10-25 NOTE — Telephone Encounter (Signed)
Telephone call  

## 2023-11-05 ENCOUNTER — Ambulatory Visit (HOSPITAL_BASED_OUTPATIENT_CLINIC_OR_DEPARTMENT_OTHER)
Admission: RE | Admit: 2023-11-05 | Discharge: 2023-11-05 | Disposition: A | Discharge status: Home / Self Care | Payer: Medicaid Other | Source: Ambulatory Visit | Attending: Nurse Practitioner | Admitting: Nurse Practitioner

## 2023-11-05 ENCOUNTER — Inpatient Hospital Stay: Payer: Medicaid Other | Attending: Oncology

## 2023-11-05 ENCOUNTER — Inpatient Hospital Stay: Payer: Medicaid Other

## 2023-11-05 ENCOUNTER — Encounter (HOSPITAL_BASED_OUTPATIENT_CLINIC_OR_DEPARTMENT_OTHER): Payer: Self-pay

## 2023-11-05 DIAGNOSIS — C884 Extranodal marginal zone b-cell lymphoma of mucosa-associated lymphoid tissue (malt-lymphoma) not having achieved remission: Secondary | ICD-10-CM | POA: Insufficient documentation

## 2023-11-05 LAB — CBC WITH DIFFERENTIAL (CANCER CENTER ONLY)
Abs Immature Granulocytes: 0.01 10*3/uL (ref 0.00–0.07)
Basophils Absolute: 0 10*3/uL (ref 0.0–0.1)
Basophils Relative: 0 %
Eosinophils Absolute: 0 10*3/uL (ref 0.0–0.5)
Eosinophils Relative: 0 %
HCT: 42.9 % (ref 36.0–46.0)
Hemoglobin: 14.2 g/dL (ref 12.0–15.0)
Immature Granulocytes: 0 %
Lymphocytes Relative: 33 %
Lymphs Abs: 2 10*3/uL (ref 0.7–4.0)
MCH: 30.1 pg (ref 26.0–34.0)
MCHC: 33.1 g/dL (ref 30.0–36.0)
MCV: 90.9 fL (ref 80.0–100.0)
Monocytes Absolute: 0.7 10*3/uL (ref 0.1–1.0)
Monocytes Relative: 11 %
Neutro Abs: 3.5 10*3/uL (ref 1.7–7.7)
Neutrophils Relative %: 56 %
Platelet Count: 257 10*3/uL (ref 150–400)
RBC: 4.72 MIL/uL (ref 3.87–5.11)
RDW: 12.9 % (ref 11.5–15.5)
WBC Count: 6.3 10*3/uL (ref 4.0–10.5)
nRBC: 0 % (ref 0.0–0.2)

## 2023-11-05 LAB — CMP (CANCER CENTER ONLY)
ALT: 8 U/L (ref 0–44)
AST: 16 U/L (ref 15–41)
Albumin: 4.6 g/dL (ref 3.5–5.0)
Alkaline Phosphatase: 68 U/L (ref 38–126)
Anion gap: 9 (ref 5–15)
BUN: 12 mg/dL (ref 6–20)
CO2: 29 mmol/L (ref 22–32)
Calcium: 9.8 mg/dL (ref 8.9–10.3)
Chloride: 102 mmol/L (ref 98–111)
Creatinine: 0.95 mg/dL (ref 0.44–1.00)
GFR, Estimated: >60 mL/min (ref >60)
Glucose, Bld: 99 mg/dL (ref 70–99)
Potassium: 4.2 mmol/L (ref 3.5–5.1)
Sodium: 140 mmol/L (ref 135–145)
Total Bilirubin: 0.4 mg/dL (ref <1.2)
Total Protein: 7.2 g/dL (ref 6.5–8.1)

## 2023-11-05 LAB — LACTATE DEHYDROGENASE: LDH: 144 U/L (ref 98–192)

## 2023-11-05 MED ORDER — IOHEXOL 300 MG/ML  SOLN
100.0000 mL | Freq: Once | INTRAMUSCULAR | Status: AC | PRN
  Administered 2023-11-05: 75 mL via INTRAVENOUS

## 2023-11-12 ENCOUNTER — Inpatient Hospital Stay: Payer: Medicaid Other | Admitting: Oncology

## 2023-11-12 VITALS — BP 120/72 | HR 68 | Temp 98.0°F | Resp 20 | Ht 63.0 in | Wt 128.1 lb

## 2023-11-12 DIAGNOSIS — C884 Extranodal marginal zone b-cell lymphoma of mucosa-associated lymphoid tissue (malt-lymphoma) not having achieved remission: Secondary | ICD-10-CM | POA: Diagnosis not present

## 2023-11-12 NOTE — Progress Notes (Signed)
[facility] OFFICE PROGRESS NOTE   Diagnosis: Gastric MALT lymphoma  INTERVAL HISTORY:   Nina Horn returns as scheduled.  She feels well.  Abdominal pain remains improved.  She feels much better as compared to prerituximab.  She ran out of Protonix a few weeks ago and developed a "sour "stomach.  This improves when Protonix was resumed.  Objective:  Vital signs in last 24 hours:  Blood pressure 120/72, pulse 68, temperature 98 F (36.7 C), temperature source Temporal, resp. rate 20, height 5\' 3"  (1.6 m), weight 128 lb 1.6 oz (58.1 kg), last menstrual period 02/04/2020, SpO2 100%, unknown if currently breastfeeding.    Lymphatics: No cervical, supraclavicular, axillary, or inguinal nodes Resp: Lungs clear bilaterally Cardio: Regular rate and rhythm GI: No hepatosplenomegaly, no mass, nontender Vascular: No leg edema   Lab Results:  Lab Results  Component Value Date   WBC 6.3 11/05/2023   HGB 14.2 11/05/2023   HCT 42.9 11/05/2023   MCV 90.9 11/05/2023   PLT 257 11/05/2023   NEUTROABS 3.5 11/05/2023    CMP  Lab Results  Component Value Date   NA 140 11/05/2023   K 4.2 11/05/2023   CL 102 11/05/2023   CO2 29 11/05/2023   GLUCOSE 99 11/05/2023   BUN 12 11/05/2023   CREATININE 0.95 11/05/2023   CALCIUM 9.8 11/05/2023   PROT 7.2 11/05/2023   ALBUMIN 4.6 11/05/2023   AST 16 11/05/2023   ALT 8 11/05/2023   ALKPHOS 68 11/05/2023   BILITOT 0.4 11/05/2023   GFRNONAA >60 11/05/2023   GFRAA >60 04/05/2020     Medications: I have reviewed the patient's current medications.   Assessment/Plan: MALT lymphoma of the stomach 02/05/2023-EGD-2 nonbleeding gastric ulcers in the incisor and gastric antrum, largest measured 10 mm, congestion, discoloration, nodularity, ulceration, and inflammation in the gastric antrum.  Nodularity and congestion in the gastric body and lesser curvature, gastric cardia, fundus, duodenum appeared normal. Pathology from the  gastric antrum and "esophagus "revealed benign ulcerative gastritis with an atypical lymphoid infiltrate H. pylori negative consistent with a MALT lymphoma, B lymphocytes CD20 positive, cyclin D1 negative, B-cell gene rearrangement positive 04/09/2023-EGD-no endoscopic evidence of ulceration, post ulcer scarring, nodularity in the gastric body, prepyloric region and gastric antrum-biopsy revealed an atypical lymphoid infiltrate consistent with a lymphoid neoplasm CT abdomen/pelvis 05/25/2023-mild wall thickening at the lesser curvature of the distal gastric body, mild gastropathic ligament lymphadenopathy, soft tissue nodules in the gastrocolic ligament CT abdomen/pelvis 05/31/2023-unchanged distal gastric body wall thickening, gastrohepatic lymph nodes PET 06/11/2023-low-level metabolic activity associated with the gastric antrum, a gastrohepatic ligament lymph node, and a peritoneal nodule, normal spleen Cycle 1 rituximab 06/14/2023 Cycle 2 rituximab 06/22/23 Cycle 3 rituximab 06/28/2023 Cycle 4 rituximab 07/05/2023 CT abdomen/pelvis 11/05/2023: Gastropathic ligament adenopathy decreased in size, diffuse gastric mucosal thickening with an under distended stomach-residual disease not excluded, no new abnormality   2.  Nausea/vomiting and abdominal pain secondary to #1 3.  05/22/2023-left index finger ganglion cyst excision, left palmar mass excision-benign epidermal inclusion cyst 4.  Leukocytosis-likely secondary to tobacco use 5.  Tobacco use 6.  06/06/2023-readmission with intractable nausea and vomiting    Disposition: Nina Horn has a history of gastric MALT lymphoma.  She completed a course of rituximab in August.  Her clinical status is improved.  The restaging CT reveals improvement in gastropathic ligament adenopathy and no evidence of disease progression.  We will ask for further review of the CT to comment on the previously noted peritoneal  nodule.  She will return for an office visit in 3  months.  She will call in the interim for new symptoms.  She will continue pantoprazole.  Thornton Papas, MD  11/12/2023  9:29 AM

## 2023-11-13 ENCOUNTER — Other Ambulatory Visit: Payer: Self-pay

## 2024-01-26 ENCOUNTER — Inpatient Hospital Stay (HOSPITAL_BASED_OUTPATIENT_CLINIC_OR_DEPARTMENT_OTHER)
Admission: EM | Admit: 2024-01-26 | Discharge: 2024-01-30 | DRG: 841 | Disposition: A | Discharge status: Home / Self Care | Payer: Medicaid Other | Attending: Internal Medicine | Admitting: Internal Medicine

## 2024-01-26 ENCOUNTER — Emergency Department (HOSPITAL_BASED_OUTPATIENT_CLINIC_OR_DEPARTMENT_OTHER): Payer: Medicaid Other

## 2024-01-26 ENCOUNTER — Encounter (HOSPITAL_BASED_OUTPATIENT_CLINIC_OR_DEPARTMENT_OTHER): Payer: Self-pay | Admitting: Emergency Medicine

## 2024-01-26 DIAGNOSIS — Z8269 Family history of other diseases of the musculoskeletal system and connective tissue: Secondary | ICD-10-CM

## 2024-01-26 DIAGNOSIS — Z1152 Encounter for screening for COVID-19: Secondary | ICD-10-CM

## 2024-01-26 DIAGNOSIS — C8599 Non-Hodgkin lymphoma, unspecified, extranodal and solid organ sites: Principal | ICD-10-CM | POA: Diagnosis present

## 2024-01-26 DIAGNOSIS — G43909 Migraine, unspecified, not intractable, without status migrainosus: Secondary | ICD-10-CM | POA: Diagnosis present

## 2024-01-26 DIAGNOSIS — K1230 Oral mucositis (ulcerative), unspecified: Secondary | ICD-10-CM

## 2024-01-26 DIAGNOSIS — R112 Nausea with vomiting, unspecified: Secondary | ICD-10-CM | POA: Diagnosis present

## 2024-01-26 DIAGNOSIS — K449 Diaphragmatic hernia without obstruction or gangrene: Secondary | ICD-10-CM | POA: Diagnosis present

## 2024-01-26 DIAGNOSIS — I808 Phlebitis and thrombophlebitis of other sites: Secondary | ICD-10-CM | POA: Diagnosis present

## 2024-01-26 DIAGNOSIS — D630 Anemia in neoplastic disease: Secondary | ICD-10-CM | POA: Diagnosis present

## 2024-01-26 DIAGNOSIS — R1084 Generalized abdominal pain: Secondary | ICD-10-CM

## 2024-01-26 DIAGNOSIS — E440 Moderate protein-calorie malnutrition: Secondary | ICD-10-CM | POA: Insufficient documentation

## 2024-01-26 DIAGNOSIS — K668 Other specified disorders of peritoneum: Secondary | ICD-10-CM | POA: Diagnosis present

## 2024-01-26 DIAGNOSIS — K257 Chronic gastric ulcer without hemorrhage or perforation: Secondary | ICD-10-CM | POA: Diagnosis present

## 2024-01-26 DIAGNOSIS — R0902 Hypoxemia: Secondary | ICD-10-CM | POA: Diagnosis present

## 2024-01-26 DIAGNOSIS — Z888 Allergy status to other drugs, medicaments and biological substances status: Secondary | ICD-10-CM

## 2024-01-26 DIAGNOSIS — Z9221 Personal history of antineoplastic chemotherapy: Secondary | ICD-10-CM

## 2024-01-26 DIAGNOSIS — C162 Malignant neoplasm of body of stomach: Secondary | ICD-10-CM | POA: Diagnosis present

## 2024-01-26 DIAGNOSIS — F17210 Nicotine dependence, cigarettes, uncomplicated: Secondary | ICD-10-CM | POA: Diagnosis present

## 2024-01-26 DIAGNOSIS — Z79899 Other long term (current) drug therapy: Secondary | ICD-10-CM

## 2024-01-26 DIAGNOSIS — Z599 Problem related to housing and economic circumstances, unspecified: Secondary | ICD-10-CM

## 2024-01-26 DIAGNOSIS — C884 Extranodal marginal zone b-cell lymphoma of mucosa-associated lymphoid tissue (malt-lymphoma) not having achieved remission: Principal | ICD-10-CM

## 2024-01-26 DIAGNOSIS — D72829 Elevated white blood cell count, unspecified: Secondary | ICD-10-CM | POA: Diagnosis present

## 2024-01-26 HISTORY — DX: Malignant (primary) neoplasm, unspecified: C80.1

## 2024-01-26 LAB — COMPREHENSIVE METABOLIC PANEL
ALT: 8 U/L (ref 0–44)
AST: 13 U/L — ABNORMAL LOW (ref 15–41)
Albumin: 4.7 g/dL (ref 3.5–5.0)
Alkaline Phosphatase: 82 U/L (ref 38–126)
Anion gap: 12 (ref 5–15)
BUN: 10 mg/dL (ref 6–20)
CO2: 26 mmol/L (ref 22–32)
Calcium: 10 mg/dL (ref 8.9–10.3)
Chloride: 101 mmol/L (ref 98–111)
Creatinine, Ser: 0.8 mg/dL (ref 0.44–1.00)
GFR, Estimated: >60 mL/min (ref >60)
Glucose, Bld: 100 mg/dL — ABNORMAL HIGH (ref 70–99)
Potassium: 3.6 mmol/L (ref 3.5–5.1)
Sodium: 139 mmol/L (ref 135–145)
Total Bilirubin: 0.5 mg/dL (ref 0.0–1.2)
Total Protein: 7.9 g/dL (ref 6.5–8.1)

## 2024-01-26 LAB — URINALYSIS, ROUTINE W REFLEX MICROSCOPIC
Bilirubin Urine: NEGATIVE
Glucose, UA: NEGATIVE mg/dL
Ketones, ur: 40 mg/dL — AB
Leukocytes,Ua: NEGATIVE
Nitrite: NEGATIVE
Protein, ur: 30 mg/dL — AB
Specific Gravity, Urine: >1.046 — ABNORMAL HIGH (ref 1.005–1.030)
pH: 6 (ref 5.0–8.0)

## 2024-01-26 LAB — CBC WITH DIFFERENTIAL/PLATELET
Abs Immature Granulocytes: 0.05 10*3/uL (ref 0.00–0.07)
Basophils Absolute: 0 10*3/uL (ref 0.0–0.1)
Basophils Relative: 0 %
Eosinophils Absolute: 0.1 10*3/uL (ref 0.0–0.5)
Eosinophils Relative: 0 %
HCT: 41.4 % (ref 36.0–46.0)
Hemoglobin: 13.7 g/dL (ref 12.0–15.0)
Immature Granulocytes: 0 %
Lymphocytes Relative: 11 %
Lymphs Abs: 1.4 10*3/uL (ref 0.7–4.0)
MCH: 30 pg (ref 26.0–34.0)
MCHC: 33.1 g/dL (ref 30.0–36.0)
MCV: 90.8 fL (ref 80.0–100.0)
Monocytes Absolute: 1.1 10*3/uL — ABNORMAL HIGH (ref 0.1–1.0)
Monocytes Relative: 9 %
Neutro Abs: 9.7 10*3/uL — ABNORMAL HIGH (ref 1.7–7.7)
Neutrophils Relative %: 80 %
Platelets: 277 10*3/uL (ref 150–400)
RBC: 4.56 MIL/uL (ref 3.87–5.11)
RDW: 13.2 % (ref 11.5–15.5)
WBC: 12.3 10*3/uL — ABNORMAL HIGH (ref 4.0–10.5)
nRBC: 0 % (ref 0.0–0.2)

## 2024-01-26 LAB — RESP PANEL BY RT-PCR (RSV, FLU A&B, COVID)  RVPGX2
Influenza A by PCR: NEGATIVE
Influenza B by PCR: NEGATIVE
Resp Syncytial Virus by PCR: NEGATIVE
SARS Coronavirus 2 by RT PCR: NEGATIVE

## 2024-01-26 LAB — LIPASE, BLOOD: Lipase: 33 U/L (ref 11–51)

## 2024-01-26 LAB — HCG, SERUM, QUALITATIVE: Preg, Serum: NEGATIVE

## 2024-01-26 MED ORDER — HYDROMORPHONE HCL 1 MG/ML IJ SOLN
1.0000 mg | INTRAMUSCULAR | Status: DC | PRN
  Administered 2024-01-27 – 2024-01-30 (×4): 1 mg via INTRAVENOUS
  Filled 2024-01-26 (×4): qty 1

## 2024-01-26 MED ORDER — HYDROMORPHONE HCL 1 MG/ML IJ SOLN
0.5000 mg | INTRAMUSCULAR | Status: DC | PRN
  Administered 2024-01-27 – 2024-01-29 (×5): 0.5 mg via INTRAVENOUS
  Filled 2024-01-26 (×5): qty 0.5

## 2024-01-26 MED ORDER — POTASSIUM CHLORIDE IN NACL 20-0.9 MEQ/L-% IV SOLN
INTRAVENOUS | Status: DC
  Filled 2024-01-26: qty 1000

## 2024-01-26 MED ORDER — MORPHINE SULFATE (PF) 4 MG/ML IV SOLN
4.0000 mg | Freq: Once | INTRAVENOUS | Status: AC
  Administered 2024-01-26: 4 mg via INTRAVENOUS
  Filled 2024-01-26: qty 1

## 2024-01-26 MED ORDER — ONDANSETRON HCL 4 MG/2ML IJ SOLN
4.0000 mg | Freq: Once | INTRAMUSCULAR | Status: AC
Start: 2024-01-26 — End: 2024-01-26
  Administered 2024-01-26: 4 mg via INTRAVENOUS
  Filled 2024-01-26: qty 2

## 2024-01-26 MED ORDER — SODIUM CHLORIDE 0.9 % IV BOLUS
1000.0000 mL | Freq: Once | INTRAVENOUS | Status: AC
  Administered 2024-01-26: 1000 mL via INTRAVENOUS

## 2024-01-26 MED ORDER — ONDANSETRON HCL 4 MG/2ML IJ SOLN
4.0000 mg | Freq: Four times a day (QID) | INTRAMUSCULAR | Status: DC | PRN
  Administered 2024-01-26 – 2024-01-29 (×6): 4 mg via INTRAVENOUS
  Filled 2024-01-26 (×6): qty 2

## 2024-01-26 MED ORDER — IOHEXOL 300 MG/ML  SOLN
100.0000 mL | Freq: Once | INTRAMUSCULAR | Status: AC | PRN
  Administered 2024-01-26: 100 mL via INTRAVENOUS

## 2024-01-26 NOTE — ED Triage Notes (Signed)
 Abdo pain (cramp) and vomiting x 2 days Not able to hold anything down "I feel like I'm having a setback with stomach CA"

## 2024-01-26 NOTE — ED Provider Notes (Signed)
 [place] [facility] AT [facility] Provider Note   CSN: [id number] Arrival date & time: 01/26/24  1634     History  Chief Complaint  Patient presents with   Abdominal Pain   Emesis    Nina Horn is a 53 y.o. female.  With a history of MALT lymphoma who presents to the ED for abdominal pain.  2 days of epigastric abdominal pain with associated nausea and vomiting.  No fevers chills change in bowel habits or respiratory symptoms.  Patient voices concern for potential recurrence of lymphoma.  Last chemotherapy was in August 2024.  She is followed by Dr. Myrle Sheng with hematology oncology   Abdominal Pain Associated symptoms: vomiting   Emesis Associated symptoms: abdominal pain        Home Medications Prior to Admission medications   Medication Sig Start Date End Date Taking? Authorizing Provider  acetaminophen (TYLENOL) 325 MG tablet Take 2 tablets (650 mg total) by mouth every 6 (six) hours as needed for mild pain, fever or headache. 06/02/23   Marguerita Merles Latif, DO  ondansetron (ZOFRAN) 4 MG tablet Take 4 mg by mouth every 8 (eight) hours as needed for vomiting or nausea. Patient not taking: Reported on 07/05/2023 01/24/23   [provider]  pantoprazole (PROTONIX) 40 MG tablet Take 1 tablet (40 mg total) by mouth 2 (two) times daily. 06/08/23 11/12/23  [place], Alvira Philips, DO  ranitidine (ZANTAC) 150 MG tablet Take 1 tablet (150 mg total) by mouth 2 (two) times daily. Patient not taking: Reported on 10/13/2018 07/27/18 04/05/20  Rigoberto Noel, MD      Allergies    Promethazine hcl and Rituximab-pvvr    Review of Systems   Review of Systems  Gastrointestinal:  Positive for abdominal pain and vomiting.    Physical Exam Updated Vital Signs BP 114/68   Pulse 65   Temp 98 F (36.7 C) (Oral)   Resp 16   LMP 02/04/2020   SpO2 99%  Physical Exam Vitals and nursing note reviewed.  HENT:     Head: Normocephalic and atraumatic.  Eyes:      Pupils: Pupils are equal, round, and reactive to light.  Cardiovascular:     Rate and Rhythm: Normal rate and regular rhythm.  Pulmonary:     Effort: Pulmonary effort is normal.     Breath sounds: Normal breath sounds.  Abdominal:     Palpations: Abdomen is soft.     Tenderness: There is abdominal tenderness in the epigastric area. There is no guarding or rebound.  Skin:    General: Skin is warm and dry.  Neurological:     Mental Status: She is alert.  Psychiatric:        Mood and Affect: Mood normal.     ED Results / Procedures / Treatments   Labs (all labs ordered are listed, but only abnormal results are displayed) Labs Reviewed  COMPREHENSIVE METABOLIC PANEL - Abnormal; Notable for the following components:      Result Value   Glucose, Bld 100 (*)    AST 13 (*)    All other components within normal limits  CBC WITH DIFFERENTIAL/PLATELET - Abnormal; Notable for the following components:   WBC 12.3 (*)    Neutro Abs 9.7 (*)    Monocytes Absolute 1.1 (*)    All other components within normal limits  URINALYSIS, ROUTINE W REFLEX MICROSCOPIC - Abnormal; Notable for the following components:   Specific Gravity, Urine >1.046 (*)  Hgb urine dipstick MODERATE (*)    Ketones, ur 40 (*)    Protein, ur 30 (*)    Bacteria, UA RARE (*)    All other components within normal limits  RESP PANEL BY RT-PCR (RSV, FLU A&B, COVID)  RVPGX2  LIPASE, BLOOD  HCG, SERUM, QUALITATIVE    EKG None  Radiology CT ABDOMEN PELVIS W CONTRAST Result Date: 01/26/2024 CLINICAL DATA:  Epigastric pain, MALT lymphoma, status post chemotherapy EXAM: CT ABDOMEN AND PELVIS WITH CONTRAST TECHNIQUE: Multidetector CT imaging of the abdomen and pelvis was performed using the standard protocol following bolus administration of intravenous contrast. RADIATION DOSE REDUCTION: This exam was performed according to the departmental dose-optimization program which includes automated exposure control, adjustment of  the mA and/or kV according to patient size and/or use of iterative reconstruction technique. CONTRAST:  OMNIPAQUE IOHEXOL 300 MG/ML  SOLN COMPARISON:  11/05/2023 FINDINGS: Lower chest: Lung bases are clear. Hepatobiliary: Liver is within normal limits. Gallbladder is unremarkable. No intrahepatic or extrahepatic ductal dilatation. Pancreas: Within normal limits. Spleen: Within normal limits. Adrenals/Urinary Tract: Adrenal glands are within normal limits. Kidneys are within normal limits.  No hydronephrosis. Bladder is within normal limits. Stomach/Bowel: Mild wall thickening involving the distal gastric body (series 2/image 29), nonspecific. However, this corresponds to the site of hypermetabolism on prior PET, raising the possibility of residual gastric lymphoma. No evidence of bowel obstruction. Appendix is not discretely visualized. No colonic wall thickening or inflammatory changes. Vascular/Lymphatic: No evidence of abdominal aortic aneurysm. Residual gastrohepatic nodes measuring up to 10 mm short axis (series 2/image 18), grossly unchanged and mildly FDG avid on prior PET, suggesting residual lymphoma. Reproductive: Uterus is within normal limits. Right ovary is within normal limits.  No left adnexal mass. Other: No abdominopelvic ascites. Small peritoneal nodules, including an 8 mm nodule beneath the left mid anterior abdominal wall (series 2/image 36), slightly more conspicuous than on the prior. Musculoskeletal: Mild degenerative changes of the lower thoracic spine. IMPRESSION: Mild wall thickening of the distal gastric body, nonspecific but suggesting residual gastric lymphoma. Small upper abdominal nodes, grossly unchanged. Small peritoneal nodules, stable versus mildly progressive. Otherwise, no findings to account for the patient's abdominal pain. Electronically Signed   By: Charline Bills M.D.   On: 01/26/2024 19:10    Procedures Procedures    Medications Ordered in ED Medications   HYDROmorphone (DILAUDID) injection 0.5 mg (has no administration in time range)  HYDROmorphone (DILAUDID) injection 1 mg (has no administration in time range)  ondansetron (ZOFRAN) injection 4 mg (has no administration in time range)  0.9 % NaCl with KCl 20 mEq/ L  infusion (has no administration in time range)  sodium chloride 0.9 % bolus 1,000 mL (1,000 mLs Intravenous New Bag/Given 01/26/24 1720)  ondansetron (ZOFRAN) injection 4 mg (4 mg Intravenous Given 01/26/24 1715)  morphine (PF) 4 MG/ML injection 4 mg (4 mg Intravenous Given 01/26/24 1748)  iohexol (OMNIPAQUE) 300 MG/ML solution 100 mL (100 mLs Intravenous Contrast Given 01/26/24 1809)  ondansetron (ZOFRAN) injection 4 mg (4 mg Intravenous Given 01/26/24 2051)    ED Course/ Medical Decision Making/ A&P Clinical Course as of 01/26/24 2348  Sat Jan 26, 2024  2240 COVID influenza RSV all negative.  Pregnancy negative.  No significant abnormalities on CMP or CBC.  UA shows no evidence of UTI.  CT concerning for recurrence of gastric lymphoma.  I discussed this with Dr. Cherly Hensen (oncology) who agrees with plan for admission given that she is symptomatic and for  further GI/oncology workup.  Also discussed with GI on-call who will plan for EGD on a nonemergent basis.  Discussed with hospitalist accepts patient for admission [MP]    Clinical Course User Index [MP] Royanne Foots, DO                                 Medical Decision Making 53 year old female with history of lymphoma last on chemotherapy August 2024 in remission presenting for abdominal pain nausea vomiting x 2 days.  Afebrile hemodynamically stable.  Epigastric tenderness on exam.  No rebound rigidity or guarding.    Differential diagnosis includes: Recurrent MALT lymphoma Acute intra-abdominal infectious/inflammatory process such as appendicitis, diverticulitis, pancreatitis and cholecystitis Urinary tract infection Atypical presentation for pneumonia Viral  gastroenteritis  Will obtain laboratory workup including CBC with differential, metabolic panel, lipase and urinalysis along with CT abdomen pelvis next Will provide IV fluids for rehydration, Zofran and morphine for pain control  Amount and/or Complexity of Data Reviewed Labs: ordered. Radiology: ordered.  Risk Prescription drug management. Decision regarding hospitalization.           Final Clinical Impression(s) / ED Diagnoses Final diagnoses:  Gastric lymphoma (HCC)  Nausea and vomiting, unspecified vomiting type    Rx / DC Orders ED Discharge Orders     None         Royanne Foots, DO 01/26/24 2349

## 2024-01-26 NOTE — ED Notes (Signed)
 Pt. Still c/o nausea, dry heaving

## 2024-01-27 ENCOUNTER — Other Ambulatory Visit: Payer: Self-pay

## 2024-01-27 DIAGNOSIS — I808 Phlebitis and thrombophlebitis of other sites: Secondary | ICD-10-CM | POA: Diagnosis present

## 2024-01-27 DIAGNOSIS — Z1152 Encounter for screening for COVID-19: Secondary | ICD-10-CM | POA: Diagnosis not present

## 2024-01-27 DIAGNOSIS — K668 Other specified disorders of peritoneum: Secondary | ICD-10-CM | POA: Diagnosis present

## 2024-01-27 DIAGNOSIS — C8599 Non-Hodgkin lymphoma, unspecified, extranodal and solid organ sites: Secondary | ICD-10-CM | POA: Diagnosis present

## 2024-01-27 DIAGNOSIS — R1084 Generalized abdominal pain: Secondary | ICD-10-CM | POA: Diagnosis not present

## 2024-01-27 DIAGNOSIS — Z8269 Family history of other diseases of the musculoskeletal system and connective tissue: Secondary | ICD-10-CM | POA: Diagnosis not present

## 2024-01-27 DIAGNOSIS — D72829 Elevated white blood cell count, unspecified: Secondary | ICD-10-CM | POA: Diagnosis present

## 2024-01-27 DIAGNOSIS — K257 Chronic gastric ulcer without hemorrhage or perforation: Secondary | ICD-10-CM | POA: Diagnosis present

## 2024-01-27 DIAGNOSIS — R112 Nausea with vomiting, unspecified: Secondary | ICD-10-CM | POA: Diagnosis not present

## 2024-01-27 DIAGNOSIS — K1230 Oral mucositis (ulcerative), unspecified: Secondary | ICD-10-CM | POA: Diagnosis present

## 2024-01-27 DIAGNOSIS — Z888 Allergy status to other drugs, medicaments and biological substances status: Secondary | ICD-10-CM | POA: Diagnosis not present

## 2024-01-27 DIAGNOSIS — R0902 Hypoxemia: Secondary | ICD-10-CM | POA: Diagnosis present

## 2024-01-27 DIAGNOSIS — K449 Diaphragmatic hernia without obstruction or gangrene: Secondary | ICD-10-CM | POA: Diagnosis present

## 2024-01-27 DIAGNOSIS — D630 Anemia in neoplastic disease: Secondary | ICD-10-CM | POA: Diagnosis present

## 2024-01-27 DIAGNOSIS — F17210 Nicotine dependence, cigarettes, uncomplicated: Secondary | ICD-10-CM | POA: Diagnosis present

## 2024-01-27 DIAGNOSIS — Z9221 Personal history of antineoplastic chemotherapy: Secondary | ICD-10-CM | POA: Diagnosis not present

## 2024-01-27 DIAGNOSIS — C884 Extranodal marginal zone b-cell lymphoma of mucosa-associated lymphoid tissue (malt-lymphoma) not having achieved remission: Secondary | ICD-10-CM | POA: Diagnosis present

## 2024-01-27 DIAGNOSIS — F32A Depression, unspecified: Secondary | ICD-10-CM | POA: Diagnosis not present

## 2024-01-27 DIAGNOSIS — Z599 Problem related to housing and economic circumstances, unspecified: Secondary | ICD-10-CM | POA: Diagnosis not present

## 2024-01-27 DIAGNOSIS — K259 Gastric ulcer, unspecified as acute or chronic, without hemorrhage or perforation: Secondary | ICD-10-CM | POA: Diagnosis not present

## 2024-01-27 DIAGNOSIS — Z79899 Other long term (current) drug therapy: Secondary | ICD-10-CM | POA: Diagnosis not present

## 2024-01-27 DIAGNOSIS — G43909 Migraine, unspecified, not intractable, without status migrainosus: Secondary | ICD-10-CM | POA: Diagnosis present

## 2024-01-27 DIAGNOSIS — C162 Malignant neoplasm of body of stomach: Secondary | ICD-10-CM | POA: Diagnosis present

## 2024-01-27 LAB — BASIC METABOLIC PANEL
Anion gap: 9 (ref 5–15)
BUN: 12 mg/dL (ref 6–20)
CO2: 26 mmol/L (ref 22–32)
Calcium: 9 mg/dL (ref 8.9–10.3)
Chloride: 105 mmol/L (ref 98–111)
Creatinine, Ser: 0.69 mg/dL (ref 0.44–1.00)
GFR, Estimated: >60 mL/min (ref >60)
Glucose, Bld: 90 mg/dL (ref 70–99)
Potassium: 3.7 mmol/L (ref 3.5–5.1)
Sodium: 140 mmol/L (ref 135–145)

## 2024-01-27 LAB — CBC
HCT: 37.4 % (ref 36.0–46.0)
Hemoglobin: 11.9 g/dL — ABNORMAL LOW (ref 12.0–15.0)
MCH: 30.2 pg (ref 26.0–34.0)
MCHC: 31.8 g/dL (ref 30.0–36.0)
MCV: 94.9 fL (ref 80.0–100.0)
Platelets: 228 10*3/uL (ref 150–400)
RBC: 3.94 MIL/uL (ref 3.87–5.11)
RDW: 13.2 % (ref 11.5–15.5)
WBC: 10.9 10*3/uL — ABNORMAL HIGH (ref 4.0–10.5)
nRBC: 0 % (ref 0.0–0.2)

## 2024-01-27 LAB — PHOSPHORUS: Phosphorus: 3.6 mg/dL (ref 2.5–4.6)

## 2024-01-27 LAB — MAGNESIUM: Magnesium: 2 mg/dL (ref 1.7–2.4)

## 2024-01-27 MED ORDER — ENOXAPARIN SODIUM 40 MG/0.4ML IJ SOSY
40.0000 mg | PREFILLED_SYRINGE | INTRAMUSCULAR | Status: DC
  Administered 2024-01-27 – 2024-01-29 (×3): 40 mg via SUBCUTANEOUS
  Filled 2024-01-27 (×3): qty 0.4

## 2024-01-27 MED ORDER — POLYETHYLENE GLYCOL 3350 17 G PO PACK: 17.0000 g | PACK | Freq: Every day | ORAL | Status: DC | PRN

## 2024-01-27 MED ORDER — PROCHLORPERAZINE EDISYLATE 10 MG/2ML IJ SOLN
10.0000 mg | Freq: Once | INTRAMUSCULAR | Status: AC
  Administered 2024-01-27: 10 mg via INTRAVENOUS
  Filled 2024-01-27: qty 2

## 2024-01-27 MED ORDER — SCOPOLAMINE 1 MG/3DAYS TD PT72
1.0000 | MEDICATED_PATCH | TRANSDERMAL | Status: AC
  Administered 2024-01-27: 1.5 mg via TRANSDERMAL
  Filled 2024-01-27: qty 1

## 2024-01-27 MED ORDER — ONDANSETRON HCL 4 MG/2ML IJ SOLN
4.0000 mg | INTRAMUSCULAR | Status: AC
  Administered 2024-01-27: 4 mg via INTRAVENOUS
  Filled 2024-01-27: qty 2

## 2024-01-27 MED ORDER — LACTATED RINGERS IV SOLN: INTRAVENOUS | Status: AC

## 2024-01-27 NOTE — Progress Notes (Signed)
  Patient was received from [facility] ED via EMS.     01/27/24 0239  Vitals  Temp 98.4 F (36.9 C)  Temp Source Oral  BP 106/68  MAP (mmHg) 81  BP Location Left Arm  BP Method Automatic  Patient Position (if appropriate) Sitting  Pulse Rate (!) 57  Pulse Rate Source Monitor  Resp 18  MEWS COLOR  MEWS Score Color Green  Oxygen Therapy  SpO2 98 %  O2 Device Room Air

## 2024-01-27 NOTE — Consult Note (Addendum)
 [facility] ONCOLOGY CONSULT NOTE  Patient Care Team: Norm Salt, [place] as PCP - General (Physician Assistant) Ladene Artist, MD as Consulting Physician (Oncology) Lynann Bologna, DO as Consulting Physician (Gastroenterology)   ASSESSMENT & PLAN:  53 y.o. woman with history of more lymphoma, H. pylori negative status post rituximab weekly x 4 as of August 2024 presented with diffuse abdominal pain.    Report is feels like when she had lymphoma initial diagnosis. CT showed mild wall thickening involving the distal gastric body that is nonspecific and residual gastrohepatic node that is unchanged raising the possibility of residual gastric lymphoma.  Clinically, patient reported missing Protonix for about more than a week.  Previously tolerating well.  Symptoms appear acute onset over just a few days.  She denies any acid reflux symptoms.  Discussed with patient, GI has been consulted for EGD for further evaluation if there is residual lymphoma.  In the meantime, continue supportive measures and control nausea, vomiting and abdominal pain, IV fluid.   Agree with scopolamine patch Zofran as needed Trial 1 dose of Compazine  Recommend IVF Consult GI for EGD Repeat LDH in the morning  All questions were answered.  Will communicated with Dr. Truett Perna for follow-up.  The total time spent in the appointment was 55 minutes encounter with patients including review of chart and various tests results, discussions about plan of care and coordination of care plan   Melven Sartorius, MD 01/27/2024 9:16 AM   CHIEF COMPLAINTS/PURPOSE OF ADMISSION MALT lymphoma  HISTORY OF PRESENTING ILLNESS:  Nina Horn 53 y.o. female is admitted for abdominal pain.  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  Patient reported that she has sudden worsening of abdominal pain diffusely and presented to ED.  This has been ongoing for  few days.  She reported she was previously using PPI twice a day, and decrease to once a day.  She has missed about a week of PPI due to waiting for paycheck and refill.  However, she denies acid reflux.  She has no abdominal distention, melena, hematochezia or other systemic symptoms.  She has no recent infectious process.  Report is feels like when she had lymphoma initial diagnosis.  In the ED, hemoglobin was normal.  MCV was normal.  Renal function and liver function were normal. CT showed mild wall thickening involving the distal gastric body that is nonspecific and residual gastrohepatic node that is unchanged raising the possibility of residual gastric lymphoma.  ED call overnight for admission.  After spoken to GI she was transferred to [facility].  Summary of oncologic history as follows from primary oncologist Dr. 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  Nodularity and congestion in the gastric body and lesser curvature, gastric cardia, fundus, duodenum appeared normal. Pathology from the gastric antrum and "esophagus "revealed benign ulcerative gastritis with an atypical lymphoid infiltrate H. pylori negative consistent with a MALT lymphoma, B lymphocytes CD20 positive, cyclin D1 negative, B-cell gene rearrangement positive 04/09/2023-EGD-no endoscopic evidence of ulceration, post ulcer scarring, nodularity in the gastric body, prepyloric region and gastric antrum-biopsy revealed an atypical lymphoid infiltrate consistent with a lymphoid neoplasm CT abdomen/pelvis 05/25/2023-mild wall thickening at the lesser curvature of the distal gastric body, mild gastropathic ligament lymphadenopathy, soft tissue nodules in the gastrocolic ligament  CT abdomen/pelvis 05/31/2023-unchanged distal gastric body wall thickening,  gastrohepatic lymph nodes PET 06/11/2023-low-level metabolic activity associated with the gastric antrum, a gastrohepatic ligament lymph node, and a peritoneal nodule, normal spleen Cycle 1 rituximab 06/14/2023 Cycle 2 rituximab 06/22/23 Cycle 3 rituximab 06/28/2023 Cycle 4 rituximab 07/05/2023 CT abdomen/pelvis 11/05/2023: Gastropathic ligament adenopathy decreased in size, diffuse gastric mucosal thickening with an under distended stomach-residual disease not excluded, no new abnormality  Oncology History  MALT lymphoma  06/07/2023 Initial Diagnosis   MALT lymphoma (HCC)   06/07/2023 Cancer Staging   Staging form: Hodgkin and Non-Hodgkin Lymphoma, AJCC 8th Edition - Clinical: Stage IV (Unknown) - Signed by Ladene Artist, MD on 06/07/2023   06/14/2023 -  Chemotherapy   Patient is on Treatment Plan : NON-HODGKINS LYMPHOMA Rituximab q7d       MEDICAL HISTORY:  Past Medical History:  Diagnosis Date   Cancer (HCC)    Depression    Migraines     SURGICAL HISTORY: Past Surgical History:  Procedure Laterality Date   PILONIDAL CYST EXCISION     WISDOM TOOTH EXTRACTION      SOCIAL HISTORY: Social History   Socioeconomic History   Marital status: Single    Spouse name: Not on file   Number of children: Not on file   Years of education: Not on file   Highest education level: Not on file  Occupational History   Not on file  Tobacco Use   Smoking status: Every Day    Current packs/day: 1.00    Types: Cigarettes   Smokeless tobacco: Never  Substance and Sexual Activity   Alcohol use: No   Drug use: Yes    Types: Marijuana   Sexual activity: Yes    Birth control/protection: None  Other Topics Concern   Not on file  Social History Narrative   Not on file   Social Drivers of Health   Financial Resource Strain: Not on file  Food Insecurity: No Food Insecurity (01/27/2024)   Hunger Vital Sign    Worried About Running Out of Food in the Last Year: Never true    Ran Out of Food  in the Last Year: Never true  Transportation Needs: No Transportation Needs (01/27/2024)   PRAPARE - Transportation    Lack of Transportation (Medical): No    Lack of Transportation (Non-Medical): No  Physical Activity: Not on file  Stress: Not on file  Social Connections: Not on file  Intimate Partner Violence: Not At Risk (01/27/2024)   Humiliation, Afraid, Rape, and Kick questionnaire    Fear of Current or Ex-Partner: No    Emotionally Abused: No    Physically Abused: No    Sexually Abused: No    FAMILY HISTORY: Family History  Problem Relation Age of Onset   Skeletal dysplasia Daughter        Thanatophoric dysplasia    ALLERGIES:  is allergic to promethazine hcl and rituximab-pvvr.  MEDICATIONS:  Current Facility-Administered Medications  Medication Dose Route Frequency Provider Last Rate Last Admin   enoxaparin (LOVENOX) injection 40 mg  40 mg Subcutaneous Q24H Hall, Carole N, DO       HYDROmorphone (DILAUDID) injection 0.5 mg  0.5 mg Intravenous Q3H PRN Crosley, Debby, MD       HYDROmorphone (DILAUDID) injection 1 mg  1 mg Intravenous Q4H PRN Crosley, Debby, MD   1 mg at 01/27/24 1610   lactated ringers infusion   Intravenous Continuous Darlin Drop, DO 50 mL/hr at 01/27/24 0803 New Bag  at 01/27/24 0803   ondansetron (ZOFRAN) injection 4 mg  4 mg Intravenous Q6H PRN Gery Pray, MD   4 mg at 01/27/24 0809   polyethylene glycol (MIRALAX / GLYCOLAX) packet 17 g  17 g Oral Daily PRN Dow Adolph N, DO       scopolamine (TRANSDERM-SCOP) 1 MG/3DAYS 1.5 mg  1 patch Transdermal STAT Hall, Carole N, DO   1.5 mg at 01/27/24 1610    REVIEW OF SYSTEMS:   Relevant review of systems were reviewed with the patient and are negative.  PHYSICAL EXAMINATION: ECOG PERFORMANCE STATUS: 1 - Symptomatic but completely ambulatory  Vitals:   01/27/24 0239 01/27/24 0545  BP: 106/68 (!) 109/58  Pulse: (!) 57 60  Resp: 18 (!) 22  Temp: 98.4 F (36.9 C) 97.8 F (36.6 C)  SpO2: 98% 100%    Filed Weights   01/27/24 0247  Weight: 126 lb 12.2 oz (57.5 kg)    GENERAL:alert, no distress and comfortable SKIN: skin color normal. No jaundice EYES: normal, sclera clear OROPHARYNX: no exudate, moist NECK: supple. No mass LYMPH:  no palpable cervical lymphadenopathy LUNGS: clear to auscultation and normal breathing effort.  No wheeze or rales HEART: regular rate & rhythm and no murmurs ABDOMEN: abdomen soft, non-tender and nondistended Musculoskeletal:  no lower extremity edema NEURO: alert answering question appropriately  LABORATORY DATA:  I have reviewed the data as listed Lab Results  Component Value Date   WBC 10.9 (H) 01/27/2024   HGB 11.9 (L) 01/27/2024   HCT 37.4 01/27/2024   MCV 94.9 01/27/2024   PLT 228 01/27/2024   Recent Labs    09/04/23 1450 11/05/23 1259 01/26/24 1705 01/27/24 0757  NA 140 140 139 140  K 3.7 4.2 3.6 3.7  CL 105 102 101 105  CO2 27 29 26 26   GLUCOSE 102* 99 100* 90  BUN 11 12 10 12   CREATININE 0.75 0.95 0.80 0.69  CALCIUM 9.4 9.8 10.0 9.0  GFRNONAA >60 >60 >60 >60  PROT 6.9 7.2 7.9  --   ALBUMIN 4.3 4.6 4.7  --   AST 12* 16 13*  --   ALT 6 8 8   --   ALKPHOS 59 68 82  --   BILITOT 0.3 0.4 0.5  --     RADIOGRAPHIC STUDIES: I have personally reviewed the radiological images as listed and agreed with the findings in the report. CT ABDOMEN PELVIS W CONTRAST Result Date: 01/26/2024 CLINICAL DATA:  Epigastric pain, MALT lymphoma, status post chemotherapy EXAM: CT ABDOMEN AND PELVIS WITH CONTRAST TECHNIQUE: Multidetector CT imaging of the abdomen and pelvis was performed using the standard protocol following bolus administration of intravenous contrast. RADIATION DOSE REDUCTION: This exam was performed according to the departmental dose-optimization program which includes automated exposure control, adjustment of the mA and/or kV according to patient size and/or use of iterative reconstruction technique. CONTRAST:  OMNIPAQUE  IOHEXOL 300 MG/ML  SOLN COMPARISON:  11/05/2023 FINDINGS: Lower chest: Lung bases are clear. Hepatobiliary: Liver is within normal limits. Gallbladder is unremarkable. No intrahepatic or extrahepatic ductal dilatation. Pancreas: Within normal limits. Spleen: Within normal limits. Adrenals/Urinary Tract: Adrenal glands are within normal limits. Kidneys are within normal limits.  No hydronephrosis. Bladder is within normal limits. Stomach/Bowel: Mild wall thickening involving the distal gastric body (series 2/image 29), nonspecific. However, this corresponds to the site of hypermetabolism on prior PET, raising the possibility of residual gastric lymphoma. No evidence of bowel obstruction. Appendix is not discretely visualized. No colonic  wall thickening or inflammatory changes. Vascular/Lymphatic: No evidence of abdominal aortic aneurysm. Residual gastrohepatic nodes measuring up to 10 mm short axis (series 2/image 18), grossly unchanged and mildly FDG avid on prior PET, suggesting residual lymphoma. Reproductive: Uterus is within normal limits. Right ovary is within normal limits.  No left adnexal mass. Other: No abdominopelvic ascites. Small peritoneal nodules, including an 8 mm nodule beneath the left mid anterior abdominal wall (series 2/image 36), slightly more conspicuous than on the prior. Musculoskeletal: Mild degenerative changes of the lower thoracic spine. IMPRESSION: Mild wall thickening of the distal gastric body, nonspecific but suggesting residual gastric lymphoma. Small upper abdominal nodes, grossly unchanged. Small peritoneal nodules, stable versus mildly progressive. Otherwise, no findings to account for the patient's abdominal pain. Electronically Signed   By: Charline Bills M.D.   On: 01/26/2024 19:10

## 2024-01-27 NOTE — H&P (View-Only) (Signed)
 [facility] Consultation Note  Referring Provider: [facility] Primary Care Physician:  Norm Salt, PA Primary Gastroenterologist:  Dr. Levora Angel  Reason for Consultation:  Nausea, vomiting  HPI: Nina Horn is a 53 y.o. female admitted nausea/vomiting and abdominal discomfort.  Started insidiously about 2 weeks ago.  Had completed treatment for gastric MALT lymphoma about 6 months ago.  Admission CT shows possible gastric wall thickening possible recurrence of lymphoma.  Has poor appetite, early satiety; symptoms she had upon her original lymphoma diagnosis.  No blood in stool or change in bowel habits.   Past Medical History:  Diagnosis Date   Cancer (HCC)    Depression    Migraines     Past Surgical History:  Procedure Laterality Date   PILONIDAL CYST EXCISION     WISDOM TOOTH EXTRACTION      Prior to Admission medications   Medication Sig Start Date End Date Taking? Authorizing Provider  acetaminophen (TYLENOL) 500 MG tablet Take 500-1,000 mg by mouth every 6 (six) hours as needed for moderate pain (pain score 4-6).   Yes [provider]  ondansetron (ZOFRAN) 4 MG tablet Take 4 mg by mouth every 8 (eight) hours as needed for vomiting or nausea. 01/24/23  Yes [provider]  pantoprazole (PROTONIX) 40 MG tablet Take 1 tablet (40 mg total) by mouth 2 (two) times daily. Patient taking differently: Take 40 mg by mouth daily. 06/08/23 01/27/24 Yes [place], Alvira Philips, DO  Scopolamine Base (SCOPOLAMINE TD) Place 1 patch onto the skin every 3 (three) days as needed (N/V).   Yes [provider]  ranitidine (ZANTAC) 150 MG tablet Take 1 tablet (150 mg total) by mouth 2 (two) times daily. Patient not taking: Reported on 10/13/2018 07/27/18 04/05/20  Rigoberto Noel, MD    Current Facility-Administered Medications  Medication Dose Route Frequency Provider Last Rate Last Admin   enoxaparin (LOVENOX) injection 40 mg  40 mg Subcutaneous  Q24H Hall, Carole N, DO       HYDROmorphone (DILAUDID) injection 0.5 mg  0.5 mg Intravenous Q3H PRN Gery Pray, MD   0.5 mg at 01/27/24 0815   HYDROmorphone (DILAUDID) injection 1 mg  1 mg Intravenous Q4H PRN Gery Pray, MD   1 mg at 01/27/24 6578   lactated ringers infusion   Intravenous Continuous Melven Sartorius, MD 75 mL/hr at 01/27/24 1126 Rate Change at 01/27/24 1126   ondansetron (ZOFRAN) injection 4 mg  4 mg Intravenous Q6H PRN Gery Pray, MD   4 mg at 01/27/24 0809   polyethylene glycol (MIRALAX / GLYCOLAX) packet 17 g  17 g Oral Daily PRN Dow Adolph N, DO       scopolamine (TRANSDERM-SCOP) 1 MG/3DAYS 1.5 mg  1 patch Transdermal STAT Hall, Carole N, DO   1.5 mg at 01/27/24 4696    Allergies as of 01/26/2024 - Review Complete 01/26/2024  Allergen Reaction Noted   Promethazine hcl Other (See Comments) 10/25/2009   Rituximab-pvvr Nausea And Vomiting 06/14/2023    Family History  Problem Relation Age of Onset   Skeletal dysplasia Daughter        Thanatophoric dysplasia    Social History   Socioeconomic History   Marital status: Single    Spouse name: Not on file   Number of children: Not on file   Years of education: Not on file   Highest education level: Not on file  Occupational History   Not on file  Tobacco Use   Smoking status: Every  Day    Current packs/day: 1.00    Types: Cigarettes   Smokeless tobacco: Never  Substance and Sexual Activity   Alcohol use: No   Drug use: Yes    Types: Marijuana   Sexual activity: Yes    Birth control/protection: None  Other Topics Concern   Not on file  Social History Narrative   Not on file   Social Drivers of Health   Financial Resource Strain: Not on file  Food Insecurity: No Food Insecurity (01/27/2024)   Hunger Vital Sign    Worried About Running Out of Food in the Last Year: Never true    Ran Out of Food in the Last Year: Never true  Transportation Needs: No Transportation Needs (01/27/2024)   PRAPARE  - Transportation    Lack of Transportation (Medical): No    Lack of Transportation (Non-Medical): No  Physical Activity: Not on file  Stress: Not on file  Social Connections: Not on file  Intimate Partner Violence: Not At Risk (01/27/2024)   Humiliation, Afraid, Rape, and Kick questionnaire    Fear of Current or Ex-Partner: No    Emotionally Abused: No    Physically Abused: No    Sexually Abused: No    Review of Systems: As per HPI, all others negative  Physical Exam: Vital signs in last 24 hours: Temp:  [97.7 F (36.5 C)-99.4 F (37.4 C)] 97.7 F (36.5 C) (02/23 1230) Pulse Rate:  [50-87] 50 (02/23 1230) Resp:  [14-24] 18 (02/23 1230) BP: (106-136)/(58-84) 111/67 (02/23 1230) SpO2:  [97 %-100 %] 100 % (02/23 1230) Weight:  [57.5 kg] 57.5 kg (02/23 0247) Last BM Date : 01/26/24 General:   Alert,  Well-developed, well-nourished, pleasant and cooperative in NAD Head:  Normocephalic and atraumatic. Eyes:  Sclera clear, no icterus.   Conjunctiva pink. Ears:  Normal auditory acuity. Nose:  No deformity, discharge,  or lesions. Mouth:  No deformity or lesions.  Oropharynx pink & moist. Neck:  Supple; no masses or thyromegaly. Lungs:  No visible respiratory distress Abdomen:  Soft, nontender and nondistended. No masses, hepatosplenomegaly or hernias noted. Normal bowel sounds, without guarding, and without rebound.     Msk:  Symmetrical without gross deformities. Normal posture. Pulses:  Normal pulses noted. Extremities:  Without clubbing or edema. Neurologic:  Alert and  oriented x4;  grossly normal neurologically. Skin:  Intact without significant lesions or rashes. Psych:  Alert and cooperative. Normal mood and affect.   Lab Results: Recent Labs    01/26/24 1705 01/27/24 0757  WBC 12.3* 10.9*  HGB 13.7 11.9*  HCT 41.4 37.4  PLT 277 228   BMET Recent Labs    01/26/24 1705 01/27/24 0757  NA 139 140  K 3.6 3.7  CL 101 105  CO2 26 26  GLUCOSE 100* 90  BUN 10 12   CREATININE 0.80 0.69  CALCIUM 10.0 9.0   LFT Recent Labs    01/26/24 1705  PROT 7.9  ALBUMIN 4.7  AST 13*  ALT 8  ALKPHOS 82  BILITOT 0.5   PT/INR No results for input(s): "LABPROT", "INR" in the last 72 hours.  Studies/Results: CT ABDOMEN PELVIS W CONTRAST Result Date: 01/26/2024 CLINICAL DATA:  Epigastric pain, MALT lymphoma, status post chemotherapy EXAM: CT ABDOMEN AND PELVIS WITH CONTRAST TECHNIQUE: Multidetector CT imaging of the abdomen and pelvis was performed using the standard protocol following bolus administration of intravenous contrast. RADIATION DOSE REDUCTION: This exam was performed according to the departmental dose-optimization program which includes automated  exposure control, adjustment of the mA and/or kV according to patient size and/or use of iterative reconstruction technique. CONTRAST:  OMNIPAQUE IOHEXOL 300 MG/ML  SOLN COMPARISON:  11/05/2023 FINDINGS: Lower chest: Lung bases are clear. Hepatobiliary: Liver is within normal limits. Gallbladder is unremarkable. No intrahepatic or extrahepatic ductal dilatation. Pancreas: Within normal limits. Spleen: Within normal limits. Adrenals/Urinary Tract: Adrenal glands are within normal limits. Kidneys are within normal limits.  No hydronephrosis. Bladder is within normal limits. Stomach/Bowel: Mild wall thickening involving the distal gastric body (series 2/image 29), nonspecific. However, this corresponds to the site of hypermetabolism on prior PET, raising the possibility of residual gastric lymphoma. No evidence of bowel obstruction. Appendix is not discretely visualized. No colonic wall thickening or inflammatory changes. Vascular/Lymphatic: No evidence of abdominal aortic aneurysm. Residual gastrohepatic nodes measuring up to 10 mm short axis (series 2/image 18), grossly unchanged and mildly FDG avid on prior PET, suggesting residual lymphoma. Reproductive: Uterus is within normal limits. Right ovary is within normal  limits.  No left adnexal mass. Other: No abdominopelvic ascites. Small peritoneal nodules, including an 8 mm nodule beneath the left mid anterior abdominal wall (series 2/image 36), slightly more conspicuous than on the prior. Musculoskeletal: Mild degenerative changes of the lower thoracic spine. IMPRESSION: Mild wall thickening of the distal gastric body, nonspecific but suggesting residual gastric lymphoma. Small upper abdominal nodes, grossly unchanged. Small peritoneal nodules, stable versus mildly progressive. Otherwise, no findings to account for the patient's abdominal pain. Electronically Signed   By: Charline Bills M.D.   On: 01/26/2024 19:10    Impression:   Abdominal pain. Nausea/vomiting. CT scan with possible recurrence gastric lymphoma.  Plan:   PPI. Antiemetics. Clear liquids, NPO after midnight. Endoscopy tomorrow for further evaluation. Risks (bleeding, infection, bowel perforation that could require surgery, sedation-related changes in cardiopulmonary systems), benefits (identification and possible treatment of source of symptoms, exclusion of certain causes of symptoms), and alternatives (watchful waiting, radiographic imaging studies, empiric medical treatment) of upper endoscopy (EGD) were explained to patient/family in detail and patient wishes to proceed.  [facility] will follow.   LOS: 0 days   Lisa-Marie Rueger M  01/27/2024, 1:46 PM  Cell [phone] If no answer or after 5 PM call [phone]

## 2024-01-27 NOTE — Consult Note (Signed)
 [facility] Consultation Note  Referring Provider: [facility] Primary Care Physician:  Norm Salt, PA Primary Gastroenterologist:  Dr. Levora Angel  Reason for Consultation:  Nausea, vomiting  HPI: Nina Horn is a 53 y.o. female admitted nausea/vomiting and abdominal discomfort.  Started insidiously about 2 weeks ago.  Had completed treatment for gastric MALT lymphoma about 6 months ago.  Admission CT shows possible gastric wall thickening possible recurrence of lymphoma.  Has poor appetite, early satiety; symptoms she had upon her original lymphoma diagnosis.  No blood in stool or change in bowel habits.   Past Medical History:  Diagnosis Date   Cancer (HCC)    Depression    Migraines     Past Surgical History:  Procedure Laterality Date   PILONIDAL CYST EXCISION     WISDOM TOOTH EXTRACTION      Prior to Admission medications   Medication Sig Start Date End Date Taking? Authorizing Provider  acetaminophen (TYLENOL) 500 MG tablet Take 500-1,000 mg by mouth every 6 (six) hours as needed for moderate pain (pain score 4-6).   Yes [provider]  ondansetron (ZOFRAN) 4 MG tablet Take 4 mg by mouth every 8 (eight) hours as needed for vomiting or nausea. 01/24/23  Yes [provider]  pantoprazole (PROTONIX) 40 MG tablet Take 1 tablet (40 mg total) by mouth 2 (two) times daily. Patient taking differently: Take 40 mg by mouth daily. 06/08/23 01/27/24 Yes [place], Alvira Philips, DO  Scopolamine Base (SCOPOLAMINE TD) Place 1 patch onto the skin every 3 (three) days as needed (N/V).   Yes [provider]  ranitidine (ZANTAC) 150 MG tablet Take 1 tablet (150 mg total) by mouth 2 (two) times daily. Patient not taking: Reported on 10/13/2018 07/27/18 04/05/20  Rigoberto Noel, MD    Current Facility-Administered Medications  Medication Dose Route Frequency Provider Last Rate Last Admin   enoxaparin (LOVENOX) injection 40 mg  40 mg Subcutaneous  Q24H Hall, Carole N, DO       HYDROmorphone (DILAUDID) injection 0.5 mg  0.5 mg Intravenous Q3H PRN Gery Pray, MD   0.5 mg at 01/27/24 0815   HYDROmorphone (DILAUDID) injection 1 mg  1 mg Intravenous Q4H PRN Gery Pray, MD   1 mg at 01/27/24 6578   lactated ringers infusion   Intravenous Continuous Melven Sartorius, MD 75 mL/hr at 01/27/24 1126 Rate Change at 01/27/24 1126   ondansetron (ZOFRAN) injection 4 mg  4 mg Intravenous Q6H PRN Gery Pray, MD   4 mg at 01/27/24 0809   polyethylene glycol (MIRALAX / GLYCOLAX) packet 17 g  17 g Oral Daily PRN Dow Adolph N, DO       scopolamine (TRANSDERM-SCOP) 1 MG/3DAYS 1.5 mg  1 patch Transdermal STAT Hall, Carole N, DO   1.5 mg at 01/27/24 4696    Allergies as of 01/26/2024 - Review Complete 01/26/2024  Allergen Reaction Noted   Promethazine hcl Other (See Comments) 10/25/2009   Rituximab-pvvr Nausea And Vomiting 06/14/2023    Family History  Problem Relation Age of Onset   Skeletal dysplasia Daughter        Thanatophoric dysplasia    Social History   Socioeconomic History   Marital status: Single    Spouse name: Not on file   Number of children: Not on file   Years of education: Not on file   Highest education level: Not on file  Occupational History   Not on file  Tobacco Use   Smoking status: Every  Day    Current packs/day: 1.00    Types: Cigarettes   Smokeless tobacco: Never  Substance and Sexual Activity   Alcohol use: No   Drug use: Yes    Types: Marijuana   Sexual activity: Yes    Birth control/protection: None  Other Topics Concern   Not on file  Social History Narrative   Not on file   Social Drivers of Health   Financial Resource Strain: Not on file  Food Insecurity: No Food Insecurity (01/27/2024)   Hunger Vital Sign    Worried About Running Out of Food in the Last Year: Never true    Ran Out of Food in the Last Year: Never true  Transportation Needs: No Transportation Needs (01/27/2024)   PRAPARE  - Transportation    Lack of Transportation (Medical): No    Lack of Transportation (Non-Medical): No  Physical Activity: Not on file  Stress: Not on file  Social Connections: Not on file  Intimate Partner Violence: Not At Risk (01/27/2024)   Humiliation, Afraid, Rape, and Kick questionnaire    Fear of Current or Ex-Partner: No    Emotionally Abused: No    Physically Abused: No    Sexually Abused: No    Review of Systems: As per HPI, all others negative  Physical Exam: Vital signs in last 24 hours: Temp:  [97.7 F (36.5 C)-99.4 F (37.4 C)] 97.7 F (36.5 C) (02/23 1230) Pulse Rate:  [50-87] 50 (02/23 1230) Resp:  [14-24] 18 (02/23 1230) BP: (106-136)/(58-84) 111/67 (02/23 1230) SpO2:  [97 %-100 %] 100 % (02/23 1230) Weight:  [57.5 kg] 57.5 kg (02/23 0247) Last BM Date : 01/26/24 General:   Alert,  Well-developed, well-nourished, pleasant and cooperative in NAD Head:  Normocephalic and atraumatic. Eyes:  Sclera clear, no icterus.   Conjunctiva pink. Ears:  Normal auditory acuity. Nose:  No deformity, discharge,  or lesions. Mouth:  No deformity or lesions.  Oropharynx pink & moist. Neck:  Supple; no masses or thyromegaly. Lungs:  No visible respiratory distress Abdomen:  Soft, nontender and nondistended. No masses, hepatosplenomegaly or hernias noted. Normal bowel sounds, without guarding, and without rebound.     Msk:  Symmetrical without gross deformities. Normal posture. Pulses:  Normal pulses noted. Extremities:  Without clubbing or edema. Neurologic:  Alert and  oriented x4;  grossly normal neurologically. Skin:  Intact without significant lesions or rashes. Psych:  Alert and cooperative. Normal mood and affect.   Lab Results: Recent Labs    01/26/24 1705 01/27/24 0757  WBC 12.3* 10.9*  HGB 13.7 11.9*  HCT 41.4 37.4  PLT 277 228   BMET Recent Labs    01/26/24 1705 01/27/24 0757  NA 139 140  K 3.6 3.7  CL 101 105  CO2 26 26  GLUCOSE 100* 90  BUN 10 12   CREATININE 0.80 0.69  CALCIUM 10.0 9.0   LFT Recent Labs    01/26/24 1705  PROT 7.9  ALBUMIN 4.7  AST 13*  ALT 8  ALKPHOS 82  BILITOT 0.5   PT/INR No results for input(s): "LABPROT", "INR" in the last 72 hours.  Studies/Results: CT ABDOMEN PELVIS W CONTRAST Result Date: 01/26/2024 CLINICAL DATA:  Epigastric pain, MALT lymphoma, status post chemotherapy EXAM: CT ABDOMEN AND PELVIS WITH CONTRAST TECHNIQUE: Multidetector CT imaging of the abdomen and pelvis was performed using the standard protocol following bolus administration of intravenous contrast. RADIATION DOSE REDUCTION: This exam was performed according to the departmental dose-optimization program which includes automated  exposure control, adjustment of the mA and/or kV according to patient size and/or use of iterative reconstruction technique. CONTRAST:  OMNIPAQUE IOHEXOL 300 MG/ML  SOLN COMPARISON:  11/05/2023 FINDINGS: Lower chest: Lung bases are clear. Hepatobiliary: Liver is within normal limits. Gallbladder is unremarkable. No intrahepatic or extrahepatic ductal dilatation. Pancreas: Within normal limits. Spleen: Within normal limits. Adrenals/Urinary Tract: Adrenal glands are within normal limits. Kidneys are within normal limits.  No hydronephrosis. Bladder is within normal limits. Stomach/Bowel: Mild wall thickening involving the distal gastric body (series 2/image 29), nonspecific. However, this corresponds to the site of hypermetabolism on prior PET, raising the possibility of residual gastric lymphoma. No evidence of bowel obstruction. Appendix is not discretely visualized. No colonic wall thickening or inflammatory changes. Vascular/Lymphatic: No evidence of abdominal aortic aneurysm. Residual gastrohepatic nodes measuring up to 10 mm short axis (series 2/image 18), grossly unchanged and mildly FDG avid on prior PET, suggesting residual lymphoma. Reproductive: Uterus is within normal limits. Right ovary is within normal  limits.  No left adnexal mass. Other: No abdominopelvic ascites. Small peritoneal nodules, including an 8 mm nodule beneath the left mid anterior abdominal wall (series 2/image 36), slightly more conspicuous than on the prior. Musculoskeletal: Mild degenerative changes of the lower thoracic spine. IMPRESSION: Mild wall thickening of the distal gastric body, nonspecific but suggesting residual gastric lymphoma. Small upper abdominal nodes, grossly unchanged. Small peritoneal nodules, stable versus mildly progressive. Otherwise, no findings to account for the patient's abdominal pain. Electronically Signed   By: Charline Bills M.D.   On: 01/26/2024 19:10    Impression:   Abdominal pain. Nausea/vomiting. CT scan with possible recurrence gastric lymphoma.  Plan:   PPI. Antiemetics. Clear liquids, NPO after midnight. Endoscopy tomorrow for further evaluation. Risks (bleeding, infection, bowel perforation that could require surgery, sedation-related changes in cardiopulmonary systems), benefits (identification and possible treatment of source of symptoms, exclusion of certain causes of symptoms), and alternatives (watchful waiting, radiographic imaging studies, empiric medical treatment) of upper endoscopy (EGD) were explained to patient/family in detail and patient wishes to proceed.  [facility] will follow.   LOS: 0 days   Lisa-Marie Rueger M  01/27/2024, 1:46 PM  Cell [phone] If no answer or after 5 PM call [phone]

## 2024-01-27 NOTE — H&P (Signed)
 History and Physical  Nina Horn ZOX:[id number] DOB: July 23, 1971 DOA: 01/26/2024  Referring physician: Accepted by Dr. Joneen Roach [facility], hospitalist service.  PCP: Norm Salt, PA  Outpatient Specialists: Medical oncology, Dr. Truett Perna Patient coming from: Home.  Chief Complaint: Abdominal pain and vomiting x 2 days.  HPI: Nina Horn is a 53 y.o. female with medical history significant for gastric cancer, she underwent chemotherapy in August 2024, 4 cycles of rituximab, who initially presented to [facility] ED with complaints of diffuse abdominal pain.  Associated with nausea and vomiting x 2 days.  No reported subjective fevers or chills.    In the ER, CT abdomen pelvis with contrast revealed findings suggestive of residual gastric lymphoma.  EDP consulted medical oncology.  GI, Dr. Dulce Sellar, was also consulted.  The patient received multiple rounds of IV opiate based analgesics and IV antiemetics.  Due to persistent nausea, EDP requested admission for further management.  Admitted by [facility], hospitalist service.  Accepted by Dr. Joneen Roach and transferred to [facility] telemetry unit as inpatient status.  ED Course: Temperature 97.8.  BP 109/58, hypoxic, respiration rate 22, O2 saturation 100% on room air.  Lab studies notable for WBC 12.3, neutrophil count 9.7.  UA showing rare bacteria.  Review of Systems: Review of systems as noted in the HPI. All other systems reviewed and are negative.   Past Medical History:  Diagnosis Date   Cancer (HCC)    Depression    Migraines    Past Surgical History:  Procedure Laterality Date   PILONIDAL CYST EXCISION     WISDOM TOOTH EXTRACTION      Social History:  reports that she has been smoking cigarettes. She has never used smokeless tobacco. She reports current drug use. Drug: Marijuana. She reports that she does not drink alcohol.   Allergies  Allergen Reactions   Promethazine Hcl Other (See Comments)    delirium    Rituximab-Pvvr Nausea And Vomiting    Patient had hypersensitivity reaction to Rituxan. See progress note from 06/14/2023 at 1231. Patient able to complete infusion.    Family History  Problem Relation Age of Onset   Skeletal dysplasia Daughter        Thanatophoric dysplasia      Prior to Admission medications   Medication Sig Start Date End Date Taking? Authorizing Provider  acetaminophen (TYLENOL) 325 MG tablet Take 2 tablets (650 mg total) by mouth every 6 (six) hours as needed for mild pain, fever or headache. 06/02/23   Marguerita Merles Latif, DO  ondansetron (ZOFRAN) 4 MG tablet Take 4 mg by mouth every 8 (eight) hours as needed for vomiting or nausea. Patient not taking: Reported on 07/05/2023 01/24/23   [provider]  pantoprazole (PROTONIX) 40 MG tablet Take 1 tablet (40 mg total) by mouth 2 (two) times daily. 06/08/23 11/12/23  [place], Alvira Philips, DO  ranitidine (ZANTAC) 150 MG tablet Take 1 tablet (150 mg total) by mouth 2 (two) times daily. Patient not taking: Reported on 10/13/2018 07/27/18 04/05/20  Rigoberto Noel, MD    Physical Exam: BP (!) 109/58 (BP Location: Left Arm)   Pulse 60   Temp 97.8 F (36.6 C) (Oral)   Resp (!) 22   Ht 5\' 3"  (1.6 m)   Wt 57.5 kg   LMP 02/04/2020   SpO2 100%   BMI 22.46 kg/m   General: 53 y.o. year-old female well developed well nourished in no acute distress.  Alert and oriented x3. Cardiovascular: Regular rate and  rhythm with no rubs or gallops.  No thyromegaly or JVD noted.  No lower extremity edema. 2/4 pulses in all 4 extremities. Respiratory: Clear to auscultation with no wheezes or rales. Good inspiratory effort. Abdomen: Soft mildly tender diffusely.  Nondistended with normal bowel sounds x4 quadrants. Muskuloskeletal: No cyanosis, clubbing or edema noted bilaterally Neuro: CN II-XII intact, strength, sensation, reflexes Skin: No ulcerative lesions noted or rashes Psychiatry: Judgement and insight appear normal. Mood is  appropriate for condition and setting          Labs on Admission:  Basic Metabolic Panel: Recent Labs  Lab 01/26/24 1705  NA 139  K 3.6  CL 101  CO2 26  GLUCOSE 100*  BUN 10  CREATININE 0.80  CALCIUM 10.0   Liver Function Tests: Recent Labs  Lab 01/26/24 1705  AST 13*  ALT 8  ALKPHOS 82  BILITOT 0.5  PROT 7.9  ALBUMIN 4.7   Recent Labs  Lab 01/26/24 1705  LIPASE 33   No results for input(s): "AMMONIA" in the last 168 hours. CBC: Recent Labs  Lab 01/26/24 1705  WBC 12.3*  NEUTROABS 9.7*  HGB 13.7  HCT 41.4  MCV 90.8  PLT 277   Cardiac Enzymes: No results for input(s): "CKTOTAL", "CKMB", "CKMBINDEX", "TROPONINI" in the last 168 hours.  BNP (last 3 results) No results for input(s): "BNP" in the last 8760 hours.  ProBNP (last 3 results) No results for input(s): "PROBNP" in the last 8760 hours.  CBG: No results for input(s): "GLUCAP" in the last 168 hours.  Radiological Exams on Admission: CT ABDOMEN PELVIS W CONTRAST Result Date: 01/26/2024 CLINICAL DATA:  Epigastric pain, MALT lymphoma, status post chemotherapy EXAM: CT ABDOMEN AND PELVIS WITH CONTRAST TECHNIQUE: Multidetector CT imaging of the abdomen and pelvis was performed using the standard protocol following bolus administration of intravenous contrast. RADIATION DOSE REDUCTION: This exam was performed according to the departmental dose-optimization program which includes automated exposure control, adjustment of the mA and/or kV according to patient size and/or use of iterative reconstruction technique. CONTRAST:  OMNIPAQUE IOHEXOL 300 MG/ML  SOLN COMPARISON:  11/05/2023 FINDINGS: Lower chest: Lung bases are clear. Hepatobiliary: Liver is within normal limits. Gallbladder is unremarkable. No intrahepatic or extrahepatic ductal dilatation. Pancreas: Within normal limits. Spleen: Within normal limits. Adrenals/Urinary Tract: Adrenal glands are within normal limits. Kidneys are within normal limits.   No hydronephrosis. Bladder is within normal limits. Stomach/Bowel: Mild wall thickening involving the distal gastric body (series 2/image 29), nonspecific. However, this corresponds to the site of hypermetabolism on prior PET, raising the possibility of residual gastric lymphoma. No evidence of bowel obstruction. Appendix is not discretely visualized. No colonic wall thickening or inflammatory changes. Vascular/Lymphatic: No evidence of abdominal aortic aneurysm. Residual gastrohepatic nodes measuring up to 10 mm short axis (series 2/image 18), grossly unchanged and mildly FDG avid on prior PET, suggesting residual lymphoma. Reproductive: Uterus is within normal limits. Right ovary is within normal limits.  No left adnexal mass. Other: No abdominopelvic ascites. Small peritoneal nodules, including an 8 mm nodule beneath the left mid anterior abdominal wall (series 2/image 36), slightly more conspicuous than on the prior. Musculoskeletal: Mild degenerative changes of the lower thoracic spine. IMPRESSION: Mild wall thickening of the distal gastric body, nonspecific but suggesting residual gastric lymphoma. Small upper abdominal nodes, grossly unchanged. Small peritoneal nodules, stable versus mildly progressive. Otherwise, no findings to account for the patient's abdominal pain. Electronically Signed   By: Charline Bills M.D.   On:  01/26/2024 19:10    EKG: I independently viewed the EKG done and my findings are as followed: Sinus pericardia rate of 54.  Nonspecific ST-T changes.  QTc 470.  Assessment/Plan Present on Admission:  Gastric lymphoma [facility])  Principal Problem:   Gastric lymphoma (HCC)  Abdominal pain in the setting of gastric lymphoma status postchemotherapy in August 2024, completed 4 cycles of rituximab Contrast-enhanced CT abdomen and pelvis revealed the following findings: Mild wall thickening of the distal gastric body, nonspecific but suggesting residual gastric lymphoma.  Small upper  abdominal nodes, grossly unchanged.  Small peritoneal nodules, stable versus mildly progressive. Continue supportive care, pain control, electrolyte replacement if indicated Gentle IV fluid hydration  Intractable nausea and vomiting, unclear etiology Suspect related to malignancy Continue as needed IV antiemetics Continue gentle IV fluid hydration, LR.  Leukocytosis. Afebrile. Rule out infective process Rare bacteria seen on UA No pulmonary infiltrates seen on CT of abdomen and pelvis Repeat CBC in the morning  Sinus bradycardia, asymptomatic. 12-lead EKG with sinus bradycardia rate of 54. Monitor on telemetry for now   Time: 75 minutes.   DVT prophylaxis: Subcu Lovenox daily.  Code Status: Full code.  Family Communication: Patient's husband at bedside.  Disposition Plan: Admitted to telemetry unit.  Consults called: Medical oncology and GI consulted by EDP.  Admission status: Inpatient status.   Status is: Inpatient The patient requires at least 2 midnights for further evaluation and treatment of pedunculation.   Darlin Drop MD [facility] Pager [phone]  If 7PM-7AM, please contact night-coverage [email or web address] Password [facility]  01/27/2024, 6:17 AM

## 2024-01-27 NOTE — Progress Notes (Signed)
 PROGRESS NOTE    Nina Horn  DGL:[id number] DOB: 1971-03-26 DOA: 01/26/2024 PCP: Norm Salt, PA  Outpatient Specialists:     Brief Narrative:  Patient is a 53 year old female, with past medical history significant for gastric cancer (MALT lymphoma), status post treatment with 4 cycles of rituximab.  Patient presented with 2-day history of abdominal pain, nausea and vomiting.  Last vomiting was about 2 hours ago.  Patient continues to have nausea.  GI team and oncology teams have been consulted.  For EGD tomorrow.  Oncology input is appreciated.  Patient was admitted earlier today.   Assessment & Plan:   Principal Problem:   Gastric lymphoma (HCC) Active Problems:   Nausea and vomiting   Diffuse abdominal pain   Abdominal pain in the setting of gastric lymphoma status postchemotherapy in August 2024, completed 4 cycles of rituximab: -Contrast-enhanced CT abdomen and pelvis revealed the following findings: Mild wall thickening of the distal gastric body, nonspecific but suggesting residual gastric lymphoma.  Small upper abdominal nodes, grossly unchanged.  Small peritoneal nodules, stable versus mildly progressive. -History of MALT lymphoma.  For likely EGD tomorrow. -Oncology input is appreciated.     Intractable nausea and vomiting, unclear etiology See above documentation. EGD tomorrow. Supportive care. GI input is appreciated.     Leukocytosis. Likely reactive.   Sinus bradycardia, asymptomatic. 12-lead EKG with sinus bradycardia rate of 54. Monitor on telemetry for now  DVT prophylaxis: Subcutaneous Lovenox. Code Status: Full code. Family Communication: Plan. Disposition Plan: Inpatient   Consultants:  GI. Oncology.  Procedures:  Likely EGD tomorrow.  Antimicrobials:  None.   Subjective: Nausea and vomiting.  Objective: Vitals:   01/27/24 0239 01/27/24 0247 01/27/24 0545 01/27/24 1230  BP: 106/68  (!) 109/58 111/67  Pulse: (!) 57   60 (!) 50  Resp: 18  (!) 22 18  Temp: 98.4 F (36.9 C)  97.8 F (36.6 C) 97.7 F (36.5 C)  TempSrc: Oral  Oral Oral  SpO2: 98%  100% 100%  Weight:  57.5 kg    Height:  5\' 3"  (1.6 m)      Intake/Output Summary (Last 24 hours) at 01/27/2024 1824 Last data filed at 01/27/2024 0411 Gross per 24 hour  Intake 439.5 ml  Output --  Net 439.5 ml   Filed Weights   01/27/24 0247  Weight: 57.5 kg    Examination:  General exam: Patient is thin.   Respiratory system: Clear to auscultation.  Cardiovascular system: S1 & S2 heard. Gastrointestinal system: Abdomen is soft and nontender. Central nervous system: Awake and alert.  Patient moves all extremities.   Extremities: No leg edema.  Data Reviewed: I have personally reviewed following labs and imaging studies  CBC: Recent Labs  Lab 01/26/24 1705 01/27/24 0757  WBC 12.3* 10.9*  NEUTROABS 9.7*  --   HGB 13.7 11.9*  HCT 41.4 37.4  MCV 90.8 94.9  PLT 277 228   Basic Metabolic Panel: Recent Labs  Lab 01/26/24 1705 01/27/24 0757  NA 139 140  K 3.6 3.7  CL 101 105  CO2 26 26  GLUCOSE 100* 90  BUN 10 12  CREATININE 0.80 0.69  CALCIUM 10.0 9.0  MG  --  2.0  PHOS  --  3.6   GFR: Estimated Creatinine Clearance: 69.6 mL/min (by C-G formula based on SCr of 0.69 mg/dL). Liver Function Tests: Recent Labs  Lab 01/26/24 1705  AST 13*  ALT 8  ALKPHOS 82  BILITOT 0.5  PROT 7.9  ALBUMIN 4.7   Recent Labs  Lab 01/26/24 1705  LIPASE 33   No results for input(s): "AMMONIA" in the last 168 hours. Coagulation Profile: No results for input(s): "INR", "PROTIME" in the last 168 hours. Cardiac Enzymes: No results for input(s): "CKTOTAL", "CKMB", "CKMBINDEX", "TROPONINI" in the last 168 hours. BNP (last 3 results) No results for input(s): "PROBNP" in the last 8760 hours. HbA1C: No results for input(s): "HGBA1C" in the last 72 hours. CBG: No results for input(s): "GLUCAP" in the last 168 hours. Lipid Profile: No results  for input(s): "CHOL", "HDL", "LDLCALC", "TRIG", "CHOLHDL", "LDLDIRECT" in the last 72 hours. Thyroid Function Tests: No results for input(s): "TSH", "T4TOTAL", "FREET4", "T3FREE", "THYROIDAB" in the last 72 hours. Anemia Panel: No results for input(s): "VITAMINB12", "FOLATE", "FERRITIN", "TIBC", "IRON", "RETICCTPCT" in the last 72 hours. Urine analysis:    Component Value Date/Time   COLORURINE YELLOW 01/26/2024 2137   APPEARANCEUR CLEAR 01/26/2024 2137   LABSPEC >1.046 (H) 01/26/2024 2137   PHURINE 6.0 01/26/2024 2137   GLUCOSEU NEGATIVE 01/26/2024 2137   HGBUR MODERATE (A) 01/26/2024 2137   BILIRUBINUR NEGATIVE 01/26/2024 2137   KETONESUR 40 (A) 01/26/2024 2137   PROTEINUR 30 (A) 01/26/2024 2137   UROBILINOGEN 0.2 12/08/2012 0240   NITRITE NEGATIVE 01/26/2024 2137   LEUKOCYTESUR NEGATIVE 01/26/2024 2137   Sepsis Labs: @LABRCNTIP (procalcitonin:4,lacticidven:4)  ) Recent Results (from the past 240 hours)  Resp panel by RT-PCR (RSV, Flu A&B, Covid) Anterior Nasal Swab     Status: None   Collection Time: 01/26/24  9:26 PM   Specimen: Anterior Nasal Swab  Result Value Ref Range Status   SARS Coronavirus 2 by RT PCR NEGATIVE NEGATIVE Final    Comment: (NOTE) SARS-CoV-2 target nucleic acids are NOT DETECTED.  The SARS-CoV-2 RNA is generally detectable in upper respiratory specimens during the acute phase of infection. The lowest concentration of SARS-CoV-2 viral copies this assay can detect is 138 copies/mL. A negative result does not preclude SARS-Cov-2 infection and should not be used as the sole basis for treatment or other patient management decisions. A negative result may occur with  improper specimen collection/handling, submission of specimen other than nasopharyngeal swab, presence of viral mutation(s) within the areas targeted by this assay, and inadequate number of viral copies(<138 copies/mL). A negative result must be combined with clinical observations, patient  history, and epidemiological information. The expected result is Negative.  Fact Sheet for Patients:  BloggerCourse.com  Fact Sheet for Healthcare Providers:  SeriousBroker.it  This test is no t yet approved or cleared by the [place] FDA and  has been authorized for detection and/or diagnosis of SARS-CoV-2 by FDA under an Emergency Use Authorization (EUA). This EUA will remain  in effect (meaning this test can be used) for the duration of the COVID-19 declaration under Section 564(b)(1) of the Act, 21 U.S.C.section 360bbb-3(b)(1), unless the authorization is terminated  or revoked sooner.       Influenza A by PCR NEGATIVE NEGATIVE Final   Influenza B by PCR NEGATIVE NEGATIVE Final    Comment: (NOTE) The Xpert Xpress SARS-CoV-2/FLU/RSV plus assay is intended as an aid in the diagnosis of influenza from Nasopharyngeal swab specimens and should not be used as a sole basis for treatment. Nasal washings and aspirates are unacceptable for Xpert Xpress SARS-CoV-2/FLU/RSV testing.  Fact Sheet for Patients: BloggerCourse.com  Fact Sheet for Healthcare Providers: SeriousBroker.it  This test is not yet approved or cleared by the [place] FDA and has been authorized for detection and/or  diagnosis of SARS-CoV-2 by FDA under an Emergency Use Authorization (EUA). This EUA will remain in effect (meaning this test can be used) for the duration of the COVID-19 declaration under Section 564(b)(1) of the Act, 21 U.S.C. section 360bbb-3(b)(1), unless the authorization is terminated or revoked.     Resp Syncytial Virus by PCR NEGATIVE NEGATIVE Final    Comment: (NOTE) Fact Sheet for Patients: BloggerCourse.com  Fact Sheet for Healthcare Providers: SeriousBroker.it  This test is not yet approved or cleared by the [place] FDA  and has been authorized for detection and/or diagnosis of SARS-CoV-2 by FDA under an Emergency Use Authorization (EUA). This EUA will remain in effect (meaning this test can be used) for the duration of the COVID-19 declaration under Section 564(b)(1) of the Act, 21 U.S.C. section 360bbb-3(b)(1), unless the authorization is terminated or revoked.  Performed at [facility], [address], [place], [place] [address]          Radiology Studies: CT ABDOMEN PELVIS W CONTRAST Result Date: 01/26/2024 CLINICAL DATA:  Epigastric pain, MALT lymphoma, status post chemotherapy EXAM: CT ABDOMEN AND PELVIS WITH CONTRAST TECHNIQUE: Multidetector CT imaging of the abdomen and pelvis was performed using the standard protocol following bolus administration of intravenous contrast. RADIATION DOSE REDUCTION: This exam was performed according to the departmental dose-optimization program which includes automated exposure control, adjustment of the mA and/or kV according to patient size and/or use of iterative reconstruction technique. CONTRAST:  OMNIPAQUE IOHEXOL 300 MG/ML  SOLN COMPARISON:  11/05/2023 FINDINGS: Lower chest: Lung bases are clear. Hepatobiliary: Liver is within normal limits. Gallbladder is unremarkable. No intrahepatic or extrahepatic ductal dilatation. Pancreas: Within normal limits. Spleen: Within normal limits. Adrenals/Urinary Tract: Adrenal glands are within normal limits. Kidneys are within normal limits.  No hydronephrosis. Bladder is within normal limits. Stomach/Bowel: Mild wall thickening involving the distal gastric body (series 2/image 29), nonspecific. However, this corresponds to the site of hypermetabolism on prior PET, raising the possibility of residual gastric lymphoma. No evidence of bowel obstruction. Appendix is not discretely visualized. No colonic wall thickening or inflammatory changes. Vascular/Lymphatic: No evidence of abdominal aortic aneurysm.  Residual gastrohepatic nodes measuring up to 10 mm short axis (series 2/image 18), grossly unchanged and mildly FDG avid on prior PET, suggesting residual lymphoma. Reproductive: Uterus is within normal limits. Right ovary is within normal limits.  No left adnexal mass. Other: No abdominopelvic ascites. Small peritoneal nodules, including an 8 mm nodule beneath the left mid anterior abdominal wall (series 2/image 36), slightly more conspicuous than on the prior. Musculoskeletal: Mild degenerative changes of the lower thoracic spine. IMPRESSION: Mild wall thickening of the distal gastric body, nonspecific but suggesting residual gastric lymphoma. Small upper abdominal nodes, grossly unchanged. Small peritoneal nodules, stable versus mildly progressive. Otherwise, no findings to account for the patient's abdominal pain. Electronically Signed   By: Charline Bills M.D.   On: 01/26/2024 19:10        Scheduled Meds:  enoxaparin (LOVENOX) injection  40 mg Subcutaneous Q24H   scopolamine  1 patch Transdermal STAT   Continuous Infusions:  lactated ringers 75 mL/hr at 01/27/24 1126     LOS: 0 days    Time spent: 35 minutes.    Berton Mount, MD  [facility] Pager #: [phone] 7PM-7AM contact night coverage as above

## 2024-01-28 ENCOUNTER — Encounter (HOSPITAL_COMMUNITY)
Admission: EM | Disposition: A | Discharge status: Home / Self Care | Payer: Self-pay | Source: Home / Self Care | Attending: Internal Medicine

## 2024-01-28 ENCOUNTER — Inpatient Hospital Stay (HOSPITAL_COMMUNITY): Payer: Medicaid Other

## 2024-01-28 ENCOUNTER — Encounter (HOSPITAL_COMMUNITY): Payer: Self-pay | Admitting: Family Medicine

## 2024-01-28 DIAGNOSIS — K259 Gastric ulcer, unspecified as acute or chronic, without hemorrhage or perforation: Secondary | ICD-10-CM | POA: Diagnosis not present

## 2024-01-28 DIAGNOSIS — F32A Depression, unspecified: Secondary | ICD-10-CM

## 2024-01-28 DIAGNOSIS — K449 Diaphragmatic hernia without obstruction or gangrene: Secondary | ICD-10-CM

## 2024-01-28 DIAGNOSIS — C8599 Non-Hodgkin lymphoma, unspecified, extranodal and solid organ sites: Secondary | ICD-10-CM | POA: Diagnosis not present

## 2024-01-28 HISTORY — PX: ESOPHAGOGASTRODUODENOSCOPY (EGD) WITH PROPOFOL: SHX5813

## 2024-01-28 HISTORY — PX: BIOPSY: SHX5522

## 2024-01-28 LAB — CBC WITH DIFFERENTIAL/PLATELET
Abs Immature Granulocytes: 0.04 10*3/uL (ref 0.00–0.07)
Basophils Absolute: 0 10*3/uL (ref 0.0–0.1)
Basophils Relative: 0 %
Eosinophils Absolute: 0.1 10*3/uL (ref 0.0–0.5)
Eosinophils Relative: 1 %
HCT: 35.6 % — ABNORMAL LOW (ref 36.0–46.0)
Hemoglobin: 11.3 g/dL — ABNORMAL LOW (ref 12.0–15.0)
Immature Granulocytes: 0 %
Lymphocytes Relative: 19 %
Lymphs Abs: 1.8 10*3/uL (ref 0.7–4.0)
MCH: 30.4 pg (ref 26.0–34.0)
MCHC: 31.7 g/dL (ref 30.0–36.0)
MCV: 95.7 fL (ref 80.0–100.0)
Monocytes Absolute: 0.9 10*3/uL (ref 0.1–1.0)
Monocytes Relative: 9 %
Neutro Abs: 6.9 10*3/uL (ref 1.7–7.7)
Neutrophils Relative %: 71 %
Platelets: 228 10*3/uL (ref 150–400)
RBC: 3.72 MIL/uL — ABNORMAL LOW (ref 3.87–5.11)
RDW: 13.1 % (ref 11.5–15.5)
WBC: 9.9 10*3/uL (ref 4.0–10.5)
nRBC: 0 % (ref 0.0–0.2)

## 2024-01-28 LAB — RENAL FUNCTION PANEL
Albumin: 3.1 g/dL — ABNORMAL LOW (ref 3.5–5.0)
Anion gap: 9 (ref 5–15)
BUN: 12 mg/dL (ref 6–20)
CO2: 26 mmol/L (ref 22–32)
Calcium: 8.8 mg/dL — ABNORMAL LOW (ref 8.9–10.3)
Chloride: 103 mmol/L (ref 98–111)
Creatinine, Ser: 0.68 mg/dL (ref 0.44–1.00)
GFR, Estimated: >60 mL/min (ref >60)
Glucose, Bld: 81 mg/dL (ref 70–99)
Phosphorus: 3.5 mg/dL (ref 2.5–4.6)
Potassium: 3.6 mmol/L (ref 3.5–5.1)
Sodium: 138 mmol/L (ref 135–145)

## 2024-01-28 LAB — MAGNESIUM: Magnesium: 1.8 mg/dL (ref 1.7–2.4)

## 2024-01-28 SURGERY — ESOPHAGOGASTRODUODENOSCOPY (EGD) WITH PROPOFOL
Anesthesia: Monitor Anesthesia Care | Laterality: Left

## 2024-01-28 MED ORDER — PROPOFOL 500 MG/50ML IV EMUL
INTRAVENOUS | Status: AC
  Filled 2024-01-28: qty 50

## 2024-01-28 MED ORDER — LIDOCAINE 2% (20 MG/ML) 5 ML SYRINGE
INTRAMUSCULAR | Status: DC | PRN
  Administered 2024-01-28: 100 mg via INTRAVENOUS

## 2024-01-28 MED ORDER — GLYCOPYRROLATE PF 0.2 MG/ML IJ SOSY
PREFILLED_SYRINGE | INTRAMUSCULAR | Status: DC | PRN
Start: 2024-01-28 — End: 2024-01-28
  Administered 2024-01-28: .2 mg via INTRAVENOUS

## 2024-01-28 MED ORDER — PANTOPRAZOLE SODIUM 40 MG IV SOLR
40.0000 mg | Freq: Two times a day (BID) | INTRAVENOUS | Status: DC
  Administered 2024-01-28 – 2024-01-29 (×4): 40 mg via INTRAVENOUS
  Filled 2024-01-28 (×5): qty 10

## 2024-01-28 MED ORDER — SENNOSIDES-DOCUSATE SODIUM 8.6-50 MG PO TABS: 1.0000 | ORAL_TABLET | Freq: Every evening | ORAL | Status: DC | PRN

## 2024-01-28 MED ORDER — PROPOFOL 10 MG/ML IV BOLUS
INTRAVENOUS | Status: DC | PRN
  Administered 2024-01-28 (×6): 50 mg via INTRAVENOUS
  Administered 2024-01-28: 100 mg via INTRAVENOUS

## 2024-01-28 MED ORDER — GLUCAGON HCL RDNA (DIAGNOSTIC) 1 MG IJ SOLR: 1.0000 mg | INTRAMUSCULAR | Status: DC | PRN

## 2024-01-28 MED ORDER — HYDRALAZINE HCL 20 MG/ML IJ SOLN: 10.0000 mg | INTRAMUSCULAR | Status: DC | PRN

## 2024-01-28 MED ORDER — PROCHLORPERAZINE EDISYLATE 10 MG/2ML IJ SOLN
10.0000 mg | Freq: Four times a day (QID) | INTRAMUSCULAR | Status: DC | PRN
  Administered 2024-01-28 – 2024-01-29 (×3): 10 mg via INTRAVENOUS
  Filled 2024-01-28 (×3): qty 2

## 2024-01-28 MED ORDER — IPRATROPIUM-ALBUTEROL 0.5-2.5 (3) MG/3ML IN SOLN: 3.0000 mL | RESPIRATORY_TRACT | Status: DC | PRN

## 2024-01-28 MED ORDER — SODIUM CHLORIDE 0.9 % IV SOLN
INTRAVENOUS | Status: DC | PRN
Start: 2024-01-28 — End: 2024-01-28

## 2024-01-28 MED ORDER — METOPROLOL TARTRATE 5 MG/5ML IV SOLN: 5.0000 mg | INTRAVENOUS | Status: DC | PRN

## 2024-01-28 MED ORDER — SALINE SPRAY 0.65 % NA SOLN
1.0000 | NASAL | Status: DC | PRN
  Filled 2024-01-28: qty 44

## 2024-01-28 SURGICAL SUPPLY — 14 items

## 2024-01-28 NOTE — Anesthesia Preprocedure Evaluation (Addendum)
 Anesthesia Evaluation  Patient identified by MRN, date of birth, ID band Patient awake    Reviewed: Allergy & Precautions, NPO status , Patient's Chart, lab work & pertinent test results  Airway Mallampati: IV  TM Distance: >3 FB Neck ROM: Full    Dental  (+) Teeth Intact, Dental Advisory Given   Pulmonary Current Smoker 1/2 ppd   Pulmonary exam normal breath sounds clear to auscultation       Cardiovascular negative cardio ROS Normal cardiovascular exam Rhythm:Regular Rate:Normal     Neuro/Psych  Headaches PSYCHIATRIC DISORDERS  Depression       GI/Hepatic ,,,(+)     substance abuse (not daily use of marijuana)  marijuana useHx gastric lympho,a    Endo/Other  negative endocrine ROS    Renal/GU negative Renal ROS  negative genitourinary   Musculoskeletal negative musculoskeletal ROS (+)    Abdominal   Peds  Hematology negative hematology ROS (+)   Anesthesia Other Findings   Reproductive/Obstetrics negative OB ROS                             Anesthesia Physical Anesthesia Plan  ASA: 3  Anesthesia Plan: MAC   Post-op Pain Management:    Induction:   PONV Risk Score and Plan: 2 and Propofol infusion and TIVA  Airway Management Planned: Natural Airway and Simple Face Mask  Additional Equipment: None  Intra-op Plan:   Post-operative Plan:   Informed Consent: I have reviewed the patients History and Physical, chart, labs and discussed the procedure including the risks, benefits and alternatives for the proposed anesthesia with the patient or authorized representative who has indicated his/her understanding and acceptance.       Plan Discussed with: CRNA  Anesthesia Plan Comments:        Anesthesia Quick Evaluation

## 2024-01-28 NOTE — Progress Notes (Signed)
 PROGRESS NOTE    Nina Horn  VWU:[id number] DOB: May 31, 1971 DOA: 01/26/2024 PCP: Norm Salt, PA    Brief Narrative:  53 year old female, with past medical history significant for gastric cancer (MALT lymphoma), status post treatment with 4 cycles of rituximab. Patient presented with 2-day history of abdominal pain, nausea and vomiting. Last vomiting was about 2 hours ago. Patient continues to have nausea. GI team and oncology teams have been consulted. For EGD tomorrow. Oncology input is appreciated.    Assessment & Plan:  Principal Problem:   Gastric lymphoma (HCC) Active Problems:   Nausea and vomiting   Diffuse abdominal pain      Abdominal pain in the setting of gastric lymphoma status postchemotherapy in August 2024, completed 4 cycles of rituximab: -Contrast-enhanced CT abdomen and pelvis revealed the following findings: Mild wall thickening of the distal gastric body, nonspecific but suggesting residual gastric lymphoma.  Small upper abdominal nodes, grossly unchanged.  Small peritoneal nodules, stable versus mildly progressive. -History of MALT lymphoma.  For likely EGD today -Oncology input is appreciated.     Intractable nausea and vomiting, unclear etiology See above documentation. Planning endoscopy today per [facility]   Leukocytosis. Likely reactive.   Sinus bradycardia, asymptomatic. 12-lead EKG with sinus bradycardia rate of 54. Monitor on telemetry for now   DVT prophylaxis: Subcutaneous Lovenox. Code Status: Full code. Family Communication:  Disposition Plan: Inpatient.  Planned endoscopy today     Subjective: EGD today.  No acute events. Seen at bedside.    Examination:  General exam: Appears calm and comfortable  Respiratory system: Clear to auscultation. Respiratory effort normal. Cardiovascular system: S1 & S2 heard, RRR. No JVD, murmurs, rubs, gallops or clicks. No pedal edema. Gastrointestinal system: Abdomen is nondistended,  soft and nontender. No organomegaly or masses felt. Normal bowel sounds heard. Central nervous system: Alert and oriented. No focal neurological deficits. Extremities: Symmetric 5 x 5 power. Skin: No rashes, lesions or ulcers Psychiatry: Judgement and insight appear normal. Mood & affect appropriate.                Diet Orders (From admission, onward)     Start     Ordered   01/28/24 1206  Diet clear liquid Room service appropriate? Yes; Fluid consistency: Thin  Diet effective now       Question Answer Comment  Room service appropriate? Yes   Fluid consistency: Thin      01/28/24 1205            Objective: Vitals:   01/28/24 1155 01/28/24 1210 01/28/24 1215 01/28/24 1238  BP: (!) 91/52 102/60 99/65 (!) 111/48  Pulse: 70 66 65 (!) 57  Resp: 17 13 16 16   Temp:    98.4 F (36.9 C)  TempSrc:    Oral  SpO2: 99% 99% 99% 100%  Weight:      Height:        Intake/Output Summary (Last 24 hours) at 01/28/2024 1507 Last data filed at 01/28/2024 1146 Gross per 24 hour  Intake 1819.09 ml  Output 0 ml  Net 1819.09 ml   Filed Weights   01/27/24 0247  Weight: 57.5 kg    Scheduled Meds:  enoxaparin (LOVENOX) injection  40 mg Subcutaneous Q24H   pantoprazole (PROTONIX) IV  40 mg Intravenous Q12H   scopolamine  1 patch Transdermal STAT   Continuous Infusions:  Nutritional status     Body mass index is 22.46 kg/m.  Data Reviewed:   CBC: Recent Labs  Lab 01/26/24 1705 01/27/24 0757 01/28/24 0509  WBC 12.3* 10.9* 9.9  NEUTROABS 9.7*  --  6.9  HGB 13.7 11.9* 11.3*  HCT 41.4 37.4 35.6*  MCV 90.8 94.9 95.7  PLT 277 228 228   Basic Metabolic Panel: Recent Labs  Lab 01/26/24 1705 01/27/24 0757 01/28/24 0509  NA 139 140 138  K 3.6 3.7 3.6  CL 101 105 103  CO2 26 26 26   GLUCOSE 100* 90 81  BUN 10 12 12   CREATININE 0.80 0.69 0.68  CALCIUM 10.0 9.0 8.8*  MG  --  2.0 1.8  PHOS  --  3.6 3.5   GFR: Estimated Creatinine Clearance: 69.6 mL/min (by  C-G formula based on SCr of 0.68 mg/dL). Liver Function Tests: Recent Labs  Lab 01/26/24 1705 01/28/24 0509  AST 13*  --   ALT 8  --   ALKPHOS 82  --   BILITOT 0.5  --   PROT 7.9  --   ALBUMIN 4.7 3.1*   Recent Labs  Lab 01/26/24 1705  LIPASE 33   No results for input(s): "AMMONIA" in the last 168 hours. Coagulation Profile: No results for input(s): "INR", "PROTIME" in the last 168 hours. Cardiac Enzymes: No results for input(s): "CKTOTAL", "CKMB", "CKMBINDEX", "TROPONINI" in the last 168 hours. BNP (last 3 results) No results for input(s): "PROBNP" in the last 8760 hours. HbA1C: No results for input(s): "HGBA1C" in the last 72 hours. CBG: No results for input(s): "GLUCAP" in the last 168 hours. Lipid Profile: No results for input(s): "CHOL", "HDL", "LDLCALC", "TRIG", "CHOLHDL", "LDLDIRECT" in the last 72 hours. Thyroid Function Tests: No results for input(s): "TSH", "T4TOTAL", "FREET4", "T3FREE", "THYROIDAB" in the last 72 hours. Anemia Panel: No results for input(s): "VITAMINB12", "FOLATE", "FERRITIN", "TIBC", "IRON", "RETICCTPCT" in the last 72 hours. Sepsis Labs: No results for input(s): "PROCALCITON", "LATICACIDVEN" in the last 168 hours.  Recent Results (from the past 240 hours)  Resp panel by RT-PCR (RSV, Flu A&B, Covid) Anterior Nasal Swab     Status: None   Collection Time: 01/26/24  9:26 PM   Specimen: Anterior Nasal Swab  Result Value Ref Range Status   SARS Coronavirus 2 by RT PCR NEGATIVE NEGATIVE Final    Comment: (NOTE) SARS-CoV-2 target nucleic acids are NOT DETECTED.  The SARS-CoV-2 RNA is generally detectable in upper respiratory specimens during the acute phase of infection. The lowest concentration of SARS-CoV-2 viral copies this assay can detect is 138 copies/mL. A negative result does not preclude SARS-Cov-2 infection and should not be used as the sole basis for treatment or other patient management decisions. A negative result may occur with   improper specimen collection/handling, submission of specimen other than nasopharyngeal swab, presence of viral mutation(s) within the areas targeted by this assay, and inadequate number of viral copies(<138 copies/mL). A negative result must be combined with clinical observations, patient history, and epidemiological information. The expected result is Negative.  Fact Sheet for Patients:  BloggerCourse.com  Fact Sheet for Healthcare Providers:  SeriousBroker.it  This test is no t yet approved or cleared by the [place] FDA and  has been authorized for detection and/or diagnosis of SARS-CoV-2 by FDA under an Emergency Use Authorization (EUA). This EUA will remain  in effect (meaning this test can be used) for the duration of the COVID-19 declaration under Section 564(b)(1) of the Act, 21 U.S.C.section 360bbb-3(b)(1), unless the authorization is terminated  or revoked sooner.       Influenza A by PCR NEGATIVE NEGATIVE  Final   Influenza B by PCR NEGATIVE NEGATIVE Final    Comment: (NOTE) The Xpert Xpress SARS-CoV-2/FLU/RSV plus assay is intended as an aid in the diagnosis of influenza from Nasopharyngeal swab specimens and should not be used as a sole basis for treatment. Nasal washings and aspirates are unacceptable for Xpert Xpress SARS-CoV-2/FLU/RSV testing.  Fact Sheet for Patients: BloggerCourse.com  Fact Sheet for Healthcare Providers: SeriousBroker.it  This test is not yet approved or cleared by the [place] FDA and has been authorized for detection and/or diagnosis of SARS-CoV-2 by FDA under an Emergency Use Authorization (EUA). This EUA will remain in effect (meaning this test can be used) for the duration of the COVID-19 declaration under Section 564(b)(1) of the Act, 21 U.S.C. section 360bbb-3(b)(1), unless the authorization is terminated or revoked.      Resp Syncytial Virus by PCR NEGATIVE NEGATIVE Final    Comment: (NOTE) Fact Sheet for Patients: BloggerCourse.com  Fact Sheet for Healthcare Providers: SeriousBroker.it  This test is not yet approved or cleared by the [place] FDA and has been authorized for detection and/or diagnosis of SARS-CoV-2 by FDA under an Emergency Use Authorization (EUA). This EUA will remain in effect (meaning this test can be used) for the duration of the COVID-19 declaration under Section 564(b)(1) of the Act, 21 U.S.C. section 360bbb-3(b)(1), unless the authorization is terminated or revoked.  Performed at [facility], [address], [place], [place] [address]          Radiology Studies: CT ABDOMEN PELVIS W CONTRAST Result Date: 01/26/2024 CLINICAL DATA:  Epigastric pain, MALT lymphoma, status post chemotherapy EXAM: CT ABDOMEN AND PELVIS WITH CONTRAST TECHNIQUE: Multidetector CT imaging of the abdomen and pelvis was performed using the standard protocol following bolus administration of intravenous contrast. RADIATION DOSE REDUCTION: This exam was performed according to the departmental dose-optimization program which includes automated exposure control, adjustment of the mA and/or kV according to patient size and/or use of iterative reconstruction technique. CONTRAST:  OMNIPAQUE IOHEXOL 300 MG/ML  SOLN COMPARISON:  11/05/2023 FINDINGS: Lower chest: Lung bases are clear. Hepatobiliary: Liver is within normal limits. Gallbladder is unremarkable. No intrahepatic or extrahepatic ductal dilatation. Pancreas: Within normal limits. Spleen: Within normal limits. Adrenals/Urinary Tract: Adrenal glands are within normal limits. Kidneys are within normal limits.  No hydronephrosis. Bladder is within normal limits. Stomach/Bowel: Mild wall thickening involving the distal gastric body (series 2/image 29), nonspecific. However, this  corresponds to the site of hypermetabolism on prior PET, raising the possibility of residual gastric lymphoma. No evidence of bowel obstruction. Appendix is not discretely visualized. No colonic wall thickening or inflammatory changes. Vascular/Lymphatic: No evidence of abdominal aortic aneurysm. Residual gastrohepatic nodes measuring up to 10 mm short axis (series 2/image 18), grossly unchanged and mildly FDG avid on prior PET, suggesting residual lymphoma. Reproductive: Uterus is within normal limits. Right ovary is within normal limits.  No left adnexal mass. Other: No abdominopelvic ascites. Small peritoneal nodules, including an 8 mm nodule beneath the left mid anterior abdominal wall (series 2/image 36), slightly more conspicuous than on the prior. Musculoskeletal: Mild degenerative changes of the lower thoracic spine. IMPRESSION: Mild wall thickening of the distal gastric body, nonspecific but suggesting residual gastric lymphoma. Small upper abdominal nodes, grossly unchanged. Small peritoneal nodules, stable versus mildly progressive. Otherwise, no findings to account for the patient's abdominal pain. Electronically Signed   By: Charline Bills M.D.   On: 01/26/2024 19:10  LOS: 1 day   Time spent= 35 mins    Miguel Rota, MD [facility]  If 7PM-7AM, please contact night-coverage  01/28/2024, 3:07 PM

## 2024-01-28 NOTE — TOC Initial Note (Signed)
 Transition of Care [facility]) - Initial/Assessment Note   Patient Details  Name: Nina Horn MRN: [id number] Date of Birth: 06-10-1971  Transition of [facility]) CM/SW Contact:    Ewing Schlein, LCSW Phone Number: 01/28/2024, 10:28 AM  Clinical Narrative: [facility] consulted for medication assistance, but patient has insurance. CSW met with patient to discuss consult. Patient reported there was a gap in her employment at [employer], so she did not have a paycheck to be able to pick up her medications. She has resumed work and was able to pick up her medications 2 days ago. TOC to clear consult at this time.                 Expected Discharge Plan: Home/Self Care Barriers to Discharge: Continued Medical Work up  Patient Goals and CMS Choice Patient states their goals for this hospitalization and ongoing recovery are:: Return home Choice offered to / list presented to : NA  Expected Discharge Plan and Services In-house Referral: Clinical Social Work Post Acute Care Choice: NA Living arrangements for the past 2 months: Apartment            DME Arranged: N/A DME Agency: NA  Prior Living Arrangements/Services Living arrangements for the past 2 months: Apartment Lives with:: Self Patient language and need for interpreter reviewed:: Yes Do you feel safe going back to the place where you live?: Yes      Need for Family Participation in Patient Care: No (Comment) Care giver support system in place?: Yes (comment) Criminal Activity/Legal Involvement Pertinent to Current Situation/Hospitalization: No - Comment as needed  Activities of Daily Living ADL Screening (condition at time of admission) Independently performs ADLs?: Yes (appropriate for developmental age) Is the patient deaf or have difficulty hearing?: No Does the patient have difficulty seeing, even when wearing glasses/contacts?: Yes Does the patient have difficulty concentrating, remembering, or making decisions?: No  Emotional  Assessment Attitude/Demeanor/Rapport: Lethargic, Gracious Affect (typically observed): Appropriate Orientation: : Oriented to Self, Oriented to Place, Oriented to  Time, Oriented to Situation Alcohol / Substance Use: Not Applicable Psych Involvement: No (comment)  Admission diagnosis:  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] Nausea and vomiting, unspecified vomiting type [R11.2] Patient Active Problem List   Diagnosis Date Noted   Diffuse abdominal pain 01/27/2024   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   Nausea and vomiting 05/31/2023   NSVD (normal spontaneous vaginal delivery) 01/02/2015   Normal labor 01/01/2015   [redacted] weeks gestation of pregnancy    Maternal age 13+, multigravida, antepartum    Encounter for ultrasound to assess interval growth of fetus    Elderly multigravida with antepartum condition or complication 06/26/2014   Previous child with anomaly, antepartum 06/26/2014   DEPRESSION 10/25/2009   MIGRAINE HEADACHE 10/25/2009   PCP:  Norm Salt, PA Pharmacy:   [facility] [address] [place] - [address] [place] [address] Phone: [phone] Fax: [phone]  Social Drivers of Health (SDOH) Social History: SDOH Screenings   Food Insecurity: No Food Insecurity (01/27/2024)  Housing: High Risk (01/27/2024)  Transportation Needs: No Transportation Needs (01/27/2024)  Utilities: Not At Risk (01/27/2024)  Tobacco Use: High Risk (01/26/2024)   SDOH Interventions: Housing Interventions: Intervention Not Indicated, Inpatient TOC  Readmission Risk Interventions    06/02/2023   12:56 PM  Readmission Risk Prevention Plan  Post Dischage Appt Complete  Medication Screening Complete  Transportation Screening Complete

## 2024-01-28 NOTE — Hospital Course (Addendum)
 Brief Narrative:  53 year old female, with past medical history significant for gastric cancer (MALT lymphoma), status post treatment with 4 cycles of rituximab. Patient presented with 2-day history of abdominal pain, nausea and vomiting. Last vomiting was about 2 hours ago. Patient continues to have nausea. GI team and oncology teams have been consulted. For EGD tomorrow. Oncology input is appreciated.    Assessment & Plan:  Principal Problem:   Gastric lymphoma (HCC) Active Problems:   Nausea and vomiting   Diffuse abdominal pain      Abdominal pain in the setting of gastric lymphoma status postchemotherapy in August 2024, completed 4 cycles of rituximab: -Contrast-enhanced CT abdomen and pelvis revealed the following findings: Mild wall thickening of the distal gastric body, nonspecific but suggesting residual gastric lymphoma.  Small upper abdominal nodes, grossly unchanged.  Small peritoneal nodules, stable versus mildly progressive. -History of MALT lymphoma.  -Endoscopy 2/24-malignant gastric ulcer, pending pathology. -PPI twice daily   Leukocytosis. Likely reactive.   Sinus bradycardia, asymptomatic. 12-lead EKG with sinus bradycardia rate of 54. Monitor on telemetry for now   DVT prophylaxis: Subcutaneous Lovenox. Code Status: Full code. Family Communication:  Disposition Plan: Inpatient.  Ongoing management for malignant ulcer     Subjective: EGD yesterday No complaints.  Poor oral intake.  Examination:  General exam: Appears calm and comfortable  Respiratory system: Clear to auscultation. Respiratory effort normal. Cardiovascular system: S1 & S2 heard, RRR. No JVD, murmurs, rubs, gallops or clicks. No pedal edema. Gastrointestinal system: Abdomen is nondistended, soft and nontender. No organomegaly or masses felt. Normal bowel sounds heard. Central nervous system: Alert and oriented. No focal neurological deficits. Extremities: Symmetric 5 x 5 power. Skin: No  rashes, lesions or ulcers Psychiatry: Judgement and insight appear normal. Mood & affect appropriate.

## 2024-01-28 NOTE — Anesthesia Postprocedure Evaluation (Signed)
 Anesthesia Post Note  Patient: Nina Horn  Procedure(s) Performed: ESOPHAGOGASTRODUODENOSCOPY (EGD) WITH PROPOFOL (Left) BIOPSY     Patient location during evaluation: PACU Anesthesia Type: MAC Level of consciousness: awake and alert Pain management: pain level controlled Vital Signs Assessment: post-procedure vital signs reviewed and stable Respiratory status: spontaneous breathing, nonlabored ventilation and respiratory function stable Cardiovascular status: blood pressure returned to baseline and stable Postop Assessment: no apparent nausea or vomiting Anesthetic complications: no   No notable events documented.  Last Vitals:  Vitals:   01/28/24 1043 01/28/24 1152  BP: 109/61 (!) 97/52  Pulse: (!) 59 74  Resp: 17 18  Temp: (!) 36.3 C 36.8 C  SpO2: 100% 100%    Last Pain:  Vitals:   01/28/24 1158  TempSrc:   PainSc: 0-No pain                 Lannie Fields

## 2024-01-28 NOTE — Progress Notes (Addendum)
 Nina Horn   DOB:07/12/1971   UJ#:[id number]      ASSESSMENT & PLAN:  1.  Abdominal pain Gastric ulcers - Likely related to malignancy and gastric ulcers - Patient reports worsening of abdominal pain which prompted visit to the ED.  Has previously been on PPI. - GI eval done.  Status post endoscopy today.  Results pending - Will continue to monitor  2.  MALT lymphoma Gastric lymphoma -Diagnosed July 2024 - Status post IV rituximab x 4 cycles -Medical oncology/Dr. Truett Perna following  3.  Nausea/Vomiting - Related to ongoing abdominal pain and gastric ulcers - Status post endoscopy today. - Administer antiemetics as ordered - Supportive care  4.  Anemia, normocytic - Hemoglobin 11.3 today - No transfusional intervention warranted at this time - Continue to monitor CBC with differential  Code Status Full  Subjective:  Patient seen awake and alert, just returned from endoscopy.  Reports ongoing stomach pain and states she has had no appetite.  Complains of intermittent nosebleeds x 2 days.  Also complains of nausea and vomiting last week however none currently.  No other distress is noted.  Objective:  Vitals:   01/28/24 1210 01/28/24 1215  BP: 102/60 99/65  Pulse: 66 65  Resp: 13 16  Temp:    SpO2: 99% 99%     Intake/Output Summary (Last 24 hours) at 01/28/2024 1226 Last data filed at 01/28/2024 1146 Gross per 24 hour  Intake 1819.09 ml  Output 0 ml  Net 1819.09 ml     REVIEW OF SYSTEMS:   Constitutional: Denies fevers, chills or abnormal night sweats Eyes: Denies blurriness of vision, double vision or watery eyes Ears, nose, mouth, throat, and face: + Nosebleeds Respiratory: Denies cough, dyspnea or wheezes Cardiovascular: Denies palpitation, chest discomfort or lower extremity swelling Gastrointestinal:  +nausea, heartburn or change in bowel habits Skin: Denies abnormal skin rashes Lymphatics: Denies new lymphadenopathy or easy bruising Neurological:  Denies numbness, tingling or new weaknesses Behavioral/Psych: Mood is stable, no new changes  All other systems were reviewed with the patient and are negative.  PHYSICAL EXAMINATION: ECOG PERFORMANCE STATUS: 1 - Symptomatic but completely ambulatory  Vitals:   01/28/24 1210 01/28/24 1215  BP: 102/60 99/65  Pulse: 66 65  Resp: 13 16  Temp:    SpO2: 99% 99%   Filed Weights   01/27/24 0247  Weight: 126 lb 12.2 oz (57.5 kg)    GENERAL: alert, no distress and comfortable SKIN: skin color, texture, turgor are normal, no rashes or significant lesions EYES: normal, conjunctiva are pink and non-injected, sclera clear OROPHARYNX: no exudate, no erythema and lips, buccal mucosa, and tongue normal  NECK: supple, thyroid normal size, non-tender, without nodularity LYMPH: no palpable lymphadenopathy in the cervical, axillary or inguinal LUNGS: clear to auscultation and percussion with normal breathing effort HEART: regular rate & rhythm and no murmurs and no lower extremity edema ABDOMEN: abdomen soft, non-tender and normal bowel sounds MUSCULOSKELETAL: no cyanosis of digits and no clubbing  PSYCH: alert & oriented x 3 with fluent speech NEURO: no focal motor/sensory deficits   All questions were answered. The patient knows to call the clinic with any problems, questions or concerns.   The total time spent in the appointment was 40 minutes encounter with patient including review of chart and various tests results, discussions about plan of care and coordination of care plan  Dawson Bills, NP 01/28/2024 12:26 PM    Labs Reviewed:  Lab Results  Component Value Date  WBC 9.9 01/28/2024   HGB 11.3 (L) 01/28/2024   HCT 35.6 (L) 01/28/2024   MCV 95.7 01/28/2024   PLT 228 01/28/2024   Recent Labs    09/04/23 1450 11/05/23 1259 01/26/24 1705 01/27/24 0757 01/28/24 0509  NA 140 140 139 140 138  K 3.7 4.2 3.6 3.7 3.6  CL 105 102 101 105 103  CO2 27 29 26 26 26   GLUCOSE 102* 99  100* 90 81  BUN 11 12 10 12 12   CREATININE 0.75 0.95 0.80 0.69 0.68  CALCIUM 9.4 9.8 10.0 9.0 8.8*  GFRNONAA >60 >60 >60 >60 >60  PROT 6.9 7.2 7.9  --   --   ALBUMIN 4.3 4.6 4.7  --  3.1*  AST 12* 16 13*  --   --   ALT 6 8 8   --   --   ALKPHOS 59 68 82  --   --   BILITOT 0.3 0.4 0.5  --   --     Studies Reviewed:  CT ABDOMEN PELVIS W CONTRAST Result Date: 01/26/2024 CLINICAL DATA:  Epigastric pain, MALT lymphoma, status post chemotherapy EXAM: CT ABDOMEN AND PELVIS WITH CONTRAST TECHNIQUE: Multidetector CT imaging of the abdomen and pelvis was performed using the standard protocol following bolus administration of intravenous contrast. RADIATION DOSE REDUCTION: This exam was performed according to the departmental dose-optimization program which includes automated exposure control, adjustment of the mA and/or kV according to patient size and/or use of iterative reconstruction technique. CONTRAST:  OMNIPAQUE IOHEXOL 300 MG/ML  SOLN COMPARISON:  11/05/2023 FINDINGS: Lower chest: Lung bases are clear. Hepatobiliary: Liver is within normal limits. Gallbladder is unremarkable. No intrahepatic or extrahepatic ductal dilatation. Pancreas: Within normal limits. Spleen: Within normal limits. Adrenals/Urinary Tract: Adrenal glands are within normal limits. Kidneys are within normal limits.  No hydronephrosis. Bladder is within normal limits. Stomach/Bowel: Mild wall thickening involving the distal gastric body (series 2/image 29), nonspecific. However, this corresponds to the site of hypermetabolism on prior PET, raising the possibility of residual gastric lymphoma. No evidence of bowel obstruction. Appendix is not discretely visualized. No colonic wall thickening or inflammatory changes. Vascular/Lymphatic: No evidence of abdominal aortic aneurysm. Residual gastrohepatic nodes measuring up to 10 mm short axis (series 2/image 18), grossly unchanged and mildly FDG avid on prior PET, suggesting residual  lymphoma. Reproductive: Uterus is within normal limits. Right ovary is within normal limits.  No left adnexal mass. Other: No abdominopelvic ascites. Small peritoneal nodules, including an 8 mm nodule beneath the left mid anterior abdominal wall (series 2/image 36), slightly more conspicuous than on the prior. Musculoskeletal: Mild degenerative changes of the lower thoracic spine. IMPRESSION: Mild wall thickening of the distal gastric body, nonspecific but suggesting residual gastric lymphoma. Small upper abdominal nodes, grossly unchanged. Small peritoneal nodules, stable versus mildly progressive. Otherwise, no findings to account for the patient's abdominal pain. Electronically Signed   By: Charline Bills M.D.   On: 01/26/2024 19:10   Nina Horn was interviewed and examined.  She is known to me with a history of gastric MALT lymphoma treated with rituximab last year.  She presented with abdominal pain.  A CT abdomen/pelvis reveals wall thickening at the distal gastric body and residual gastropathic lymph nodes and peritoneal nodules.  She underwent an upper endoscopy today and this reveals a malignant appearing gastric ulcer, likely the source of her pain.  We will follow-up on the final pathology and recommend treatment options.  Recommendations: Antiacid therapy as recommended by Dr.  Outlaw Follow-up pathology from the gastric ulcer biopsy Analgesics as needed Oncology will follow her in the hospital and outpatient follow-up will be scheduled at the Cancer center

## 2024-01-28 NOTE — Op Note (Signed)
 [facility] Patient Name: Nina Horn Procedure Date: 01/28/2024 MRN: [id number] Attending MD: Willis Modena , MD, [id number] Date of Birth: November 16, 1971 CSN: [id number] Age: 53 Admit Type: Inpatient Procedure:                Upper GI endoscopy Indications:              Generalized abdominal pain, Nausea with vomiting Providers:                Willis Modena, MD, Eliberto Ivory, RN, Salley Scarlet, Technician, Arne Cleveland, CRNA Referring MD:             [facility] Medicines:                Monitored Anesthesia Care Complications:            No immediate complications. Estimated Blood Loss:     Estimated blood loss: none. Procedure:                Pre-Anesthesia Assessment:                           - Prior to the procedure, a History and Physical                            was performed, and patient medications and                            allergies were reviewed. The patient's tolerance of                            previous anesthesia was also reviewed. The risks                            and benefits of the procedure and the sedation                            options and risks were discussed with the patient.                            All questions were answered, and informed consent                            was obtained. Prior Anticoagulants: The patient has                            taken no anticoagulant or antiplatelet agents. ASA                            Grade Assessment: III - A patient with severe                            systemic disease. After reviewing the risks and  benefits, the patient was deemed in satisfactory                            condition to undergo the procedure.                           After obtaining informed consent, the endoscope was                            passed under direct vision. Throughout the                            procedure, the patient's blood  pressure, pulse, and                            oxygen saturations were monitored continuously. The                            [id number]-H190 ([id number] Olympus endoscope was introduced                            through the mouth, and advanced to the second part                            of duodenum. The upper GI endoscopy was                            accomplished without difficulty. The patient                            tolerated the procedure well. Scope In: Scope Out: Findings:      The examined esophagus was normal.      One non-obstructing non-bleeding cratered gastric ulcer of moderate to       significant severity was found in the gastric body and on the greater       curvature of the stomach, with significant circumferential erythema and       edema. The lesion was 30 mm in largest dimension. Biopsies were taken       with a cold forceps for histology.      A small hiatal hernia was present.      The exam of the stomach was otherwise normal.      The duodenal bulb, first portion of the duodenum and second portion of       the duodenum were normal. Impression:               - Normal esophagus.                           - Non-obstructing non-bleeding gastric ulcer.                            Biopsied. Suspect malignant, suspect source of                            symptoms. No evidence of gastric outlet obstruction  seen.                           - Small hiatal hernia.                           - Normal duodenal bulb, first portion of the                            duodenum and second portion of the duodenum. Moderate Sedation:      Not Applicable - Patient had care per Anesthesia. Recommendation:           - Return patient to hospital ward for ongoing care.                           - Clear liquid diet today.                           - Continue present medications.                           - Await pathology results.                           Deboraha Sprang  GI will follow. Procedure Code(s):        --- Professional ---                           [phone], Esophagogastroduodenoscopy, flexible,                            transoral; with biopsy, single or multiple Diagnosis Code(s):        --- Professional ---                           K25.9, Gastric ulcer, unspecified as acute or                            chronic, without hemorrhage or perforation                           K44.9, Diaphragmatic hernia without obstruction or                            gangrene                           R10.84, Generalized abdominal pain                           R11.2, Nausea with vomiting, unspecified CPT copyright 2022 [employer]. All rights reserved. The codes documented in this report are preliminary and upon coder review may  be revised to meet current compliance requirements. Willis Modena, MD 01/28/2024 12:04:46 PM This report has been signed electronically. Number of Addenda: 0

## 2024-01-28 NOTE — Transfer of Care (Signed)
 Immediate Anesthesia Transfer of Care Note  Patient: Fran A Lucus  Procedure(s) Performed: ESOPHAGOGASTRODUODENOSCOPY (EGD) WITH PROPOFOL (Left) BIOPSY  Patient Location: PACU, endo  Anesthesia Type:MAC  Level of Consciousness: drowsy  Airway & Oxygen Therapy: Patient Spontanous Breathing and Patient connected to face mask oxygen  Post-op Assessment: Report given to RN and Post -op Vital signs reviewed and stable  Post vital signs: Reviewed and stable  Last Vitals:  Vitals Value Taken Time  BP 97/52   Temp 97   Pulse 78   Resp 18   SpO2 100     Last Pain:  Vitals:   01/28/24 1043  TempSrc: Temporal  PainSc: 0-No pain      Patients Stated Pain Goal: 3 (01/27/24 0452)  Complications: No notable events documented.

## 2024-01-28 NOTE — Interval H&P Note (Signed)
 History and Physical Interval Note:  01/28/2024 11:11 AM  Nina Horn  has presented today for surgery, with the diagnosis of nausea, vomiting, abnormal CT scan.  The various methods of treatment have been discussed with the patient and family. After consideration of risks, benefits and other options for treatment, the patient has consented to  Procedure(s): ESOPHAGOGASTRODUODENOSCOPY (EGD) WITH PROPOFOL (Left) as a surgical intervention.  The patient's history has been reviewed, patient examined, no change in status, stable for surgery.  I have reviewed the patient's chart and labs.  Questions were answered to the patient's satisfaction.     Freddy Jaksch

## 2024-01-29 ENCOUNTER — Encounter (HOSPITAL_COMMUNITY): Payer: Self-pay | Admitting: Gastroenterology

## 2024-01-29 DIAGNOSIS — C8599 Non-Hodgkin lymphoma, unspecified, extranodal and solid organ sites: Secondary | ICD-10-CM | POA: Diagnosis not present

## 2024-01-29 LAB — MAGNESIUM: Magnesium: 1.8 mg/dL (ref 1.7–2.4)

## 2024-01-29 LAB — BASIC METABOLIC PANEL
Anion gap: 10 (ref 5–15)
BUN: 7 mg/dL (ref 6–20)
CO2: 29 mmol/L (ref 22–32)
Calcium: 9.4 mg/dL (ref 8.9–10.3)
Chloride: 104 mmol/L (ref 98–111)
Creatinine, Ser: 0.83 mg/dL (ref 0.44–1.00)
GFR, Estimated: >60 mL/min (ref >60)
Glucose, Bld: 90 mg/dL (ref 70–99)
Potassium: 3.6 mmol/L (ref 3.5–5.1)
Sodium: 143 mmol/L (ref 135–145)

## 2024-01-29 LAB — CBC
HCT: 37.9 % (ref 36.0–46.0)
Hemoglobin: 12.3 g/dL (ref 12.0–15.0)
MCH: 30.4 pg (ref 26.0–34.0)
MCHC: 32.5 g/dL (ref 30.0–36.0)
MCV: 93.8 fL (ref 80.0–100.0)
Platelets: 258 10*3/uL (ref 150–400)
RBC: 4.04 MIL/uL (ref 3.87–5.11)
RDW: 12.9 % (ref 11.5–15.5)
WBC: 10.6 10*3/uL — ABNORMAL HIGH (ref 4.0–10.5)
nRBC: 0 % (ref 0.0–0.2)

## 2024-01-29 LAB — PHOSPHORUS: Phosphorus: 3.2 mg/dL (ref 2.5–4.6)

## 2024-01-29 MED ORDER — POTASSIUM CHLORIDE CRYS ER 20 MEQ PO TBCR
40.0000 meq | EXTENDED_RELEASE_TABLET | Freq: Once | ORAL | Status: AC
  Administered 2024-01-29: 40 meq via ORAL
  Filled 2024-01-29: qty 2

## 2024-01-29 MED ORDER — PHENOL 1.4 % MT LIQD
1.0000 | OROMUCOSAL | Status: DC | PRN
  Administered 2024-01-29: 1 via OROMUCOSAL
  Filled 2024-01-29: qty 177

## 2024-01-29 MED ORDER — BOOST / RESOURCE BREEZE PO LIQD CUSTOM
1.0000 | Freq: Three times a day (TID) | ORAL | Status: DC
  Administered 2024-01-29 – 2024-01-30 (×5): 1 via ORAL

## 2024-01-29 MED ORDER — MAGIC MOUTHWASH W/LIDOCAINE
2.0000 mL | Freq: Four times a day (QID) | ORAL | Status: DC
  Administered 2024-01-29 – 2024-01-30 (×5): 2 mL via ORAL
  Filled 2024-01-29 (×7): qty 5

## 2024-01-29 MED ORDER — MAGNESIUM OXIDE -MG SUPPLEMENT 400 (240 MG) MG PO TABS
800.0000 mg | ORAL_TABLET | Freq: Once | ORAL | Status: AC
  Administered 2024-01-29: 800 mg via ORAL
  Filled 2024-01-29: qty 2

## 2024-01-29 NOTE — Progress Notes (Addendum)
 Initial Nutrition Assessment DOCUMENTATION CODES:  Not applicable  INTERVENTION:  Boost Breeze po TID, each supplement provides 250 kcal and 9 grams of protein  Placed order for clear liquid tray for lunch and dinner. NUTRITION DIAGNOSIS:  Increased nutrient needs related to cancer and cancer related treatments as evidenced by estimated needs.  GOAL:  Patient will meet greater than or equal to 90% of their needs  MONITOR:  PO intake, Supplement acceptance, Diet advancement, Weight trends  REASON FOR ASSESSMENT:  Consult Assessment of nutrition requirement/status, Diet education  ASSESSMENT:  Pt with hx of gastric cancer (with completed chemotherapy 07/2023 and 4 cycles of rituximab). Hx of depression. Admitted for significant n/v, abdominal pain and CT indicated residual gastric lymphoma.  Pt being treated for gastric ulcers, n/v, and anemia. Pt being monitored for gastric lymphoma status.  2/23 CT scan- residual gastric node 2/24 endoscopy- malignant gastric ulcer  Spoke with pt who was awake and alert. Pt reports she had severe n/v for 2 days PTA, but has not had any vomiting since. Pt upset at time of interview and reported that she was very hungry and did not receive tray this morning. RD ordered lunch tray for pt who is currently ordered a clear liquid diet. Discussed drinking Boost Breeze while on clear liquids to help with calorie and protein intake.   PTA, pt reports good appetite and she was eating well. Unable to obtain recall history d/t pt receiving important phone call at time of interview. Will assess typical day at f/u.  Unable to perform nutrition focused physical exam at this time, will perform at f/u.  Per chart review, pt's weight seems stable since 09/2023 and had regained weight following last cancer treatment (06/2023). Unable to obtain further information about wt status from pt.  Medications reviewed and include: lovenox, protonix, dilaudid,  compazine Potassium chloride (given in morning, now d/c), Magnesium oxide (given in morning, now d/c)  Labs reviewed  NUTRITION - FOCUSED PHYSICAL EXAM: Unable to perform nutrition focused physical exam at this time, will perform at f/u.  Diet Order:   Diet Order             Diet clear liquid Room service appropriate? Yes; Fluid consistency: Thin  Diet effective now                  EDUCATION NEEDS:  Not appropriate for education at this time (interrupted by phone call)  Skin:  Skin Assessment: Reviewed RN Assessment  Last BM:  per chart review, last bm 2/25  Height:  Ht Readings from Last 1 Encounters:  01/27/24 5\' 3"  (1.6 m)   Weight:  Wt Readings from Last 1 Encounters:  01/27/24 57.5 kg   Ideal Body Weight:  52.3 kg  BMI:  Body mass index is 22.46 kg/m.  Estimated Nutritional Needs:   Kcal:  1600-1800  Protein:  85-100g  Fluid:  >/= 2L  Louis Meckel Dietetic Intern

## 2024-01-29 NOTE — Progress Notes (Signed)
 PROGRESS NOTE    Nina Horn  BJY:[id number] DOB: 03-17-1971 DOA: 01/26/2024 PCP: Norm Salt, PA    Brief Narrative:  53 year old female, with past medical history significant for gastric cancer (MALT lymphoma), status post treatment with 4 cycles of rituximab. Patient presented with 2-day history of abdominal pain, nausea and vomiting. Last vomiting was about 2 hours ago. Patient continues to have nausea. GI team and oncology teams have been consulted. For EGD tomorrow. Oncology input is appreciated.    Assessment & Plan:  Principal Problem:   Gastric lymphoma (HCC) Active Problems:   Nausea and vomiting   Diffuse abdominal pain      Abdominal pain in the setting of gastric lymphoma status postchemotherapy in August 2024, completed 4 cycles of rituximab: -Contrast-enhanced CT abdomen and pelvis revealed the following findings: Mild wall thickening of the distal gastric body, nonspecific but suggesting residual gastric lymphoma.  Small upper abdominal nodes, grossly unchanged.  Small peritoneal nodules, stable versus mildly progressive. -History of MALT lymphoma.  -Endoscopy 2/24-malignant gastric ulcer, pending pathology. -PPI twice daily   Leukocytosis. Likely reactive.   Sinus bradycardia, asymptomatic. 12-lead EKG with sinus bradycardia rate of 54. Monitor on telemetry for now   DVT prophylaxis: Subcutaneous Lovenox. Code Status: Full code. Family Communication:  Disposition Plan: Inpatient.  Ongoing management for malignant ulcer     Subjective: EGD yesterday No complaints.  Poor oral intake.  Examination:  General exam: Appears calm and comfortable  Respiratory system: Clear to auscultation. Respiratory effort normal. Cardiovascular system: S1 & S2 heard, RRR. No JVD, murmurs, rubs, gallops or clicks. No pedal edema. Gastrointestinal system: Abdomen is nondistended, soft and nontender. No organomegaly or masses felt. Normal bowel sounds  heard. Central nervous system: Alert and oriented. No focal neurological deficits. Extremities: Symmetric 5 x 5 power. Skin: No rashes, lesions or ulcers Psychiatry: Judgement and insight appear normal. Mood & affect appropriate.                Diet Orders (From admission, onward)     Start     Ordered   01/28/24 1206  Diet clear liquid Room service appropriate? Yes; Fluid consistency: Thin  Diet effective now       Question Answer Comment  Room service appropriate? Yes   Fluid consistency: Thin      01/28/24 1205            Objective: Vitals:   01/28/24 1724 01/28/24 1953 01/29/24 0008 01/29/24 0429  BP: 113/60 115/63 (!) 104/59 104/64  Pulse: (!) 58 63 (!) 53 71  Resp: 18 20 20 20   Temp: 99 F (37.2 C) 98.7 F (37.1 C) 98.5 F (36.9 C) 99.3 F (37.4 C)  TempSrc: Oral Oral Oral Oral  SpO2: 98% 99% 99% 99%  Weight:      Height:        Intake/Output Summary (Last 24 hours) at 01/29/2024 1158 Last data filed at 01/28/2024 1645 Gross per 24 hour  Intake 360 ml  Output --  Net 360 ml   Filed Weights   01/27/24 0247  Weight: 57.5 kg    Scheduled Meds:  enoxaparin (LOVENOX) injection  40 mg Subcutaneous Q24H   feeding supplement  1 Container Oral TID BM   pantoprazole (PROTONIX) IV  40 mg Intravenous Q12H   scopolamine  1 patch Transdermal STAT   Continuous Infusions:  Nutritional status     Body mass index is 22.46 kg/m.  Data Reviewed:   CBC: Recent Labs  Lab 01/26/24 1705 01/27/24 0757 01/28/24 0509 01/29/24 0510  WBC 12.3* 10.9* 9.9 10.6*  NEUTROABS 9.7*  --  6.9  --   HGB 13.7 11.9* 11.3* 12.3  HCT 41.4 37.4 35.6* 37.9  MCV 90.8 94.9 95.7 93.8  PLT 277 228 228 258   Basic Metabolic Panel: Recent Labs  Lab 01/26/24 1705 01/27/24 0757 01/28/24 0509 01/29/24 0510  NA 139 140 138 143  K 3.6 3.7 3.6 3.6  CL 101 105 103 104  CO2 26 26 26 29   GLUCOSE 100* 90 81 90  BUN 10 12 12 7   CREATININE 0.80 0.69 0.68 0.83  CALCIUM  10.0 9.0 8.8* 9.4  MG  --  2.0 1.8 1.8  PHOS  --  3.6 3.5 3.2   GFR: Estimated Creatinine Clearance: 67.1 mL/min (by C-G formula based on SCr of 0.83 mg/dL). Liver Function Tests: Recent Labs  Lab 01/26/24 1705 01/28/24 0509  AST 13*  --   ALT 8  --   ALKPHOS 82  --   BILITOT 0.5  --   PROT 7.9  --   ALBUMIN 4.7 3.1*   Recent Labs  Lab 01/26/24 1705  LIPASE 33   No results for input(s): "AMMONIA" in the last 168 hours. Coagulation Profile: No results for input(s): "INR", "PROTIME" in the last 168 hours. Cardiac Enzymes: No results for input(s): "CKTOTAL", "CKMB", "CKMBINDEX", "TROPONINI" in the last 168 hours. BNP (last 3 results) No results for input(s): "PROBNP" in the last 8760 hours. HbA1C: No results for input(s): "HGBA1C" in the last 72 hours. CBG: No results for input(s): "GLUCAP" in the last 168 hours. Lipid Profile: No results for input(s): "CHOL", "HDL", "LDLCALC", "TRIG", "CHOLHDL", "LDLDIRECT" in the last 72 hours. Thyroid Function Tests: No results for input(s): "TSH", "T4TOTAL", "FREET4", "T3FREE", "THYROIDAB" in the last 72 hours. Anemia Panel: No results for input(s): "VITAMINB12", "FOLATE", "FERRITIN", "TIBC", "IRON", "RETICCTPCT" in the last 72 hours. Sepsis Labs: No results for input(s): "PROCALCITON", "LATICACIDVEN" in the last 168 hours.  Recent Results (from the past 240 hours)  Resp panel by RT-PCR (RSV, Flu A&B, Covid) Anterior Nasal Swab     Status: None   Collection Time: 01/26/24  9:26 PM   Specimen: Anterior Nasal Swab  Result Value Ref Range Status   SARS Coronavirus 2 by RT PCR NEGATIVE NEGATIVE Final    Comment: (NOTE) SARS-CoV-2 target nucleic acids are NOT DETECTED.  The SARS-CoV-2 RNA is generally detectable in upper respiratory specimens during the acute phase of infection. The lowest concentration of SARS-CoV-2 viral copies this assay can detect is 138 copies/mL. A negative result does not preclude SARS-Cov-2 infection and  should not be used as the sole basis for treatment or other patient management decisions. A negative result may occur with  improper specimen collection/handling, submission of specimen other than nasopharyngeal swab, presence of viral mutation(s) within the areas targeted by this assay, and inadequate number of viral copies(<138 copies/mL). A negative result must be combined with clinical observations, patient history, and epidemiological information. The expected result is Negative.  Fact Sheet for Patients:  BloggerCourse.com  Fact Sheet for Healthcare Providers:  SeriousBroker.it  This test is no t yet approved or cleared by the [place] FDA and  has been authorized for detection and/or diagnosis of SARS-CoV-2 by FDA under an Emergency Use Authorization (EUA). This EUA will remain  in effect (meaning this test can be used) for the duration of the COVID-19 declaration under Section 564(b)(1) of the Act, 21 U.S.C.section  360bbb-3(b)(1), unless the authorization is terminated  or revoked sooner.       Influenza A by PCR NEGATIVE NEGATIVE Final   Influenza B by PCR NEGATIVE NEGATIVE Final    Comment: (NOTE) The Xpert Xpress SARS-CoV-2/FLU/RSV plus assay is intended as an aid in the diagnosis of influenza from Nasopharyngeal swab specimens and should not be used as a sole basis for treatment. Nasal washings and aspirates are unacceptable for Xpert Xpress SARS-CoV-2/FLU/RSV testing.  Fact Sheet for Patients: BloggerCourse.com  Fact Sheet for Healthcare Providers: SeriousBroker.it  This test is not yet approved or cleared by the [place] FDA and has been authorized for detection and/or diagnosis of SARS-CoV-2 by FDA under an Emergency Use Authorization (EUA). This EUA will remain in effect (meaning this test can be used) for the duration of the COVID-19 declaration  under Section 564(b)(1) of the Act, 21 U.S.C. section 360bbb-3(b)(1), unless the authorization is terminated or revoked.     Resp Syncytial Virus by PCR NEGATIVE NEGATIVE Final    Comment: (NOTE) Fact Sheet for Patients: BloggerCourse.com  Fact Sheet for Healthcare Providers: SeriousBroker.it  This test is not yet approved or cleared by the [place] FDA and has been authorized for detection and/or diagnosis of SARS-CoV-2 by FDA under an Emergency Use Authorization (EUA). This EUA will remain in effect (meaning this test can be used) for the duration of the COVID-19 declaration under Section 564(b)(1) of the Act, 21 U.S.C. section 360bbb-3(b)(1), unless the authorization is terminated or revoked.  Performed at [facility], [address] [place], [place] [address]          Radiology Studies: No results found.         LOS: 2 days   Time spent= 35 mins    Miguel Rota, MD [facility]  If 7PM-7AM, please contact night-coverage  01/29/2024, 11:58 AM

## 2024-01-29 NOTE — Progress Notes (Addendum)
 Nina Horn   DOB:February 27, 1971   ZO#:[id number]      ASSESSMENT & PLAN:  1.  Abdominal pain Gastric ulcers - Likely related to malignancy and gastric ulcers - Patient reports worsening of abdominal pain which prompted visit to the ED.  Has previously been on PPI. - GI eval done.  Status post endoscopy on 01/28/2024.  Results pending, will follow-up on results. - Continue antacid therapy as recommended by GI 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 Truett Perna following   3.  Nausea/Vomiting - Related to ongoing abdominal pain and gastric ulcers - Status post endoscopy on 01/28/2024 - Administer antiemetics as ordered - Supportive care   4.  Anemia, normocytic - Hemoglobin has normalized 12.3 today - No intervention warranted at this time - Continue to monitor CBC with differential  5.  Phlebitis, right UE - Continue warm compresses as needed, patient reports she has been applying warm compresses with minimal relief. -Afebrile, monitor fever curve  6.  Oral mucositis - Noted on inside of bottom and top lips - Ordered Magic mouthwash with lidocaine 4 times daily - Continue to monitor  Code Status Full  Subjective:  Patient seen awake and alert eating liquid diet.  Reports slight abdominal pain with ongoing intermittent nausea.  Also complains of mucositis inside her mouth.  No other acute distress is noted.  Objective:  Vitals:   01/29/24 0008 01/29/24 0429  BP: (!) 104/59 104/64  Pulse: (!) 53 71  Resp: 20 20  Temp: 98.5 F (36.9 C) 99.3 F (37.4 C)  SpO2: 99% 99%     Intake/Output Summary (Last 24 hours) at 01/29/2024 1024 Last data filed at 01/28/2024 1645 Gross per 24 hour  Intake 560 ml  Output 0 ml  Net 560 ml     REVIEW OF SYSTEMS:   Constitutional: Denies fevers, chills or abnormal night sweats Eyes: Denies blurriness of vision, double vision  or watery eyes Ears, nose, mouth, throat, and face: + mucositis + sore throat Respiratory: Denies cough, dyspnea or wheezes Cardiovascular: Denies palpitation, chest discomfort or lower extremity swelling Gastrointestinal: + Nausea + abdominal pain Skin: Denies abnormal skin rashes Lymphatics: Denies new lymphadenopathy or easy bruising Neurological: Denies numbness, tingling or new weaknesses Behavioral/Psych: Mood is stable, no new changes  All other systems were reviewed with the patient and are negative.  PHYSICAL EXAMINATION: ECOG PERFORMANCE STATUS: 2 - Symptomatic, <50% confined to bed  Vitals:   01/29/24 0008 01/29/24 0429  BP: (!) 104/59 104/64  Pulse: (!) 53 71  Resp: 20 20  Temp: 98.5 F (36.9 C) 99.3 F (37.4 C)  SpO2: 99% 99%   Filed Weights   01/27/24 0247  Weight: 126 lb 12.2 oz (57.5 kg)    GENERAL: alert, no distress and comfortable SKIN: skin color, texture, turgor are normal, no rashes or significant lesions EYES: normal, conjunctiva are pink and non-injected, sclera clear OROPHARYNX: no exudate, no erythema and lips, buccal mucosa, and tongue normal  NECK: supple, thyroid normal size, non-tender, without nodularity LYMPH: no palpable lymphadenopathy in the cervical, axillary or inguinal LUNGS: clear to auscultation and percussion with normal breathing effort HEART: regular rate & rhythm and no murmurs and no lower extremity edema ABDOMEN: abdomen soft, non-tender and normal bowel sounds MUSCULOSKELETAL: no cyanosis of digits and no clubbing  PSYCH: alert & oriented x 3 with fluent speech NEURO: no focal motor/sensory  deficits   All questions were answered. The patient knows to call the clinic with any problems, questions or concerns.   The total time spent in the appointment was 40 minutes encounter with patient including review of chart and various tests results, discussions about plan of care and coordination of care plan  Dawson Bills,  NP 01/29/2024 10:24 AM    Labs Reviewed:  Lab Results  Component Value Date   WBC 10.6 (H) 01/29/2024   HGB 12.3 01/29/2024   HCT 37.9 01/29/2024   MCV 93.8 01/29/2024   PLT 258 01/29/2024   Recent Labs    09/04/23 1450 11/05/23 1259 01/26/24 1705 01/27/24 0757 01/28/24 0509 01/29/24 0510  NA 140 140 139 140 138 143  K 3.7 4.2 3.6 3.7 3.6 3.6  CL 105 102 101 105 103 104  CO2 27 29 26 26 26 29   GLUCOSE 102* 99 100* 90 81 90  BUN 11 12 10 12 12 7   CREATININE 0.75 0.95 0.80 0.69 0.68 0.83  CALCIUM 9.4 9.8 10.0 9.0 8.8* 9.4  GFRNONAA >60 >60 >60 >60 >60 >60  PROT 6.9 7.2 7.9  --   --   --   ALBUMIN 4.3 4.6 4.7  --  3.1*  --   AST 12* 16 13*  --   --   --   ALT 6 8 8   --   --   --   ALKPHOS 59 68 82  --   --   --   BILITOT 0.3 0.4 0.5  --   --   --     Studies Reviewed:  CT ABDOMEN PELVIS W CONTRAST Result Date: 01/26/2024 CLINICAL DATA:  Epigastric pain, MALT lymphoma, status post chemotherapy EXAM: CT ABDOMEN AND PELVIS WITH CONTRAST TECHNIQUE: Multidetector CT imaging of the abdomen and pelvis was performed using the standard protocol following bolus administration of intravenous contrast. RADIATION DOSE REDUCTION: This exam was performed according to the departmental dose-optimization program which includes automated exposure control, adjustment of the mA and/or kV according to patient size and/or use of iterative reconstruction technique. CONTRAST:  OMNIPAQUE IOHEXOL 300 MG/ML  SOLN COMPARISON:  11/05/2023 FINDINGS: Lower chest: Lung bases are clear. Hepatobiliary: Liver is within normal limits. Gallbladder is unremarkable. No intrahepatic or extrahepatic ductal dilatation. Pancreas: Within normal limits. Spleen: Within normal limits. Adrenals/Urinary Tract: Adrenal glands are within normal limits. Kidneys are within normal limits.  No hydronephrosis. Bladder is within normal limits. Stomach/Bowel: Mild wall thickening involving the distal gastric body (series 2/image  29), nonspecific. However, this corresponds to the site of hypermetabolism on prior PET, raising the possibility of residual gastric lymphoma. No evidence of bowel obstruction. Appendix is not discretely visualized. No colonic wall thickening or inflammatory changes. Vascular/Lymphatic: No evidence of abdominal aortic aneurysm. Residual gastrohepatic nodes measuring up to 10 mm short axis (series 2/image 18), grossly unchanged and mildly FDG avid on prior PET, suggesting residual lymphoma. Reproductive: Uterus is within normal limits. Right ovary is within normal limits.  No left adnexal mass. Other: No abdominopelvic ascites. Small peritoneal nodules, including an 8 mm nodule beneath the left mid anterior abdominal wall (series 2/image 36), slightly more conspicuous than on the prior. Musculoskeletal: Mild degenerative changes of the lower thoracic spine. IMPRESSION: Mild wall thickening of the distal gastric body, nonspecific but suggesting residual gastric lymphoma. Small upper abdominal nodes, grossly unchanged. Small peritoneal nodules, stable versus mildly progressive. Otherwise, no findings to account for the patient's abdominal pain. Electronically Signed  By: Charline Bills M.D.   On: 01/26/2024 19:10  Ms. Veley was interviewed and examined.  I discussed the endoscopy findings with her.  The pathology from the gastric ulcer biopsy is pending.  She continues to have abdominal pain, likely secondary to the gastric ulcer.  We will follow-up on the final pathology and decide on treatment options.  I explained treatment may consist of chemotherapy, biologic therapy, or radiation.  She has erythema and tenderness at the right upper extremity following IV placement.  She appears to have  superficial thrombophlebitis.  She will apply warm compresses.  She could take aspirin, but I am concerned about toxicity potential toxicity in the setting of a large gastric ulcer.  Recommendations: Continue  antiacid therapy, advance diet per gastroenterology Follow-up gastric ulcer biopsy result Warm compresses to the right upper extremity Outpatient follow-up will be scheduled at the Cancer center

## 2024-01-30 ENCOUNTER — Encounter: Payer: Self-pay | Admitting: Oncology

## 2024-01-30 ENCOUNTER — Other Ambulatory Visit (HOSPITAL_COMMUNITY): Payer: Self-pay

## 2024-01-30 DIAGNOSIS — E440 Moderate protein-calorie malnutrition: Secondary | ICD-10-CM | POA: Insufficient documentation

## 2024-01-30 DIAGNOSIS — C8599 Non-Hodgkin lymphoma, unspecified, extranodal and solid organ sites: Secondary | ICD-10-CM | POA: Diagnosis not present

## 2024-01-30 LAB — CBC
HCT: 36.5 % (ref 36.0–46.0)
Hemoglobin: 11.9 g/dL — ABNORMAL LOW (ref 12.0–15.0)
MCH: 30.9 pg (ref 26.0–34.0)
MCHC: 32.6 g/dL (ref 30.0–36.0)
MCV: 94.8 fL (ref 80.0–100.0)
Platelets: 242 10*3/uL (ref 150–400)
RBC: 3.85 MIL/uL — ABNORMAL LOW (ref 3.87–5.11)
RDW: 12.9 % (ref 11.5–15.5)
WBC: 8.9 10*3/uL (ref 4.0–10.5)
nRBC: 0 % (ref 0.0–0.2)

## 2024-01-30 LAB — BASIC METABOLIC PANEL
Anion gap: 8 (ref 5–15)
BUN: 6 mg/dL (ref 6–20)
CO2: 26 mmol/L (ref 22–32)
Calcium: 8.7 mg/dL — ABNORMAL LOW (ref 8.9–10.3)
Chloride: 103 mmol/L (ref 98–111)
Creatinine, Ser: 0.78 mg/dL (ref 0.44–1.00)
GFR, Estimated: >60 mL/min (ref >60)
Glucose, Bld: 98 mg/dL (ref 70–99)
Potassium: 3.6 mmol/L (ref 3.5–5.1)
Sodium: 137 mmol/L (ref 135–145)

## 2024-01-30 LAB — MAGNESIUM: Magnesium: 2.1 mg/dL (ref 1.7–2.4)

## 2024-01-30 MED ORDER — POLYETHYLENE GLYCOL 3350 17 G PO PACK
17.0000 g | PACK | Freq: Every day | ORAL | 0 refills | Status: AC | PRN
  Filled 2024-01-30: qty 14, 14d supply, fill #0

## 2024-01-30 MED ORDER — OXYCODONE HCL 5 MG PO TABS
5.0000 mg | ORAL_TABLET | Freq: Four times a day (QID) | ORAL | 0 refills | Status: DC | PRN
  Filled 2024-01-30: qty 30, 8d supply, fill #0

## 2024-01-30 MED ORDER — SENNOSIDES-DOCUSATE SODIUM 8.6-50 MG PO TABS
1.0000 | ORAL_TABLET | Freq: Every evening | ORAL | 0 refills | Status: AC | PRN
  Filled 2024-01-30: qty 30, 30d supply, fill #0

## 2024-01-30 MED ORDER — PANTOPRAZOLE SODIUM 40 MG PO TBEC
40.0000 mg | DELAYED_RELEASE_TABLET | Freq: Two times a day (BID) | ORAL | 0 refills | Status: DC
  Filled 2024-01-30: qty 60, 30d supply, fill #0

## 2024-01-30 MED ORDER — NYSTATIN 100000 UNIT/ML MT SUSP
2.0000 mL | Freq: Four times a day (QID) | ORAL | 0 refills | Status: AC
Start: 2024-01-30 — End: 2024-02-06
  Filled 2024-01-30: qty 56, 7d supply, fill #0

## 2024-01-30 MED ORDER — PANTOPRAZOLE SODIUM 40 MG PO TBEC
40.0000 mg | DELAYED_RELEASE_TABLET | Freq: Two times a day (BID) | ORAL | Status: DC
  Administered 2024-01-30: 40 mg via ORAL
  Filled 2024-01-30: qty 1

## 2024-01-30 NOTE — Progress Notes (Addendum)
 Nutrition Follow-up DOCUMENTATION CODES:  Non-severe (moderate) malnutrition in context of chronic illness  INTERVENTION:  Discussed "Eating with Mouth and Throat Pain" handout from [employer].  Boost Breeze po TID, each supplement provides 250 kcal and 9 grams of protein  Continue PO intake of full liquids and advance to soft foods as tolerable.  NUTRITION DIAGNOSIS:  Moderate Malnutrition related to chronic illness (cancer) as evidenced by energy intake < 75% for > or equal to 1 month, mild fat depletion, moderate muscle depletion.  GOAL:  Patient will meet greater than or equal to 90% of their needs Ongoing  MONITOR:  PO intake, Supplement acceptance, Weight trends  REASON FOR ASSESSMENT:  Consult Assessment of nutrition requirement/status, Diet education  ASSESSMENT:  Pt with hx of gastric cancer (with completed chemotherapy 07/2023 and 4 cycles of rituximab). Hx of depression. Admitted for significant n/v, abdominal pain and CT indicated residual gastric lymphoma.  F/u with pt to finish obtaining diet hx and conduct nutrition focused physical exam.   Spoke with pt who was awake and alert. Pt's mood greatly improved since yesterday. Pt reports eating well on clear liquids yesterday and now eating well on the full liquid diet. Pt reported eating all of tray from this morning. Pt reports pain in mouth due to mucositis and we discussed ways to eat when having mouth and throat pain. Pt thankful for education since it will help her during upcoming treatment.  PTA, pt reports low appetite week leading up to admission. Pt reports eating bites of soups, jello, egg salad and drinking green shakes. Pt reports fair appetite in months prior, but still eating small amounts throughout the day. Pt reports during last cancer treatment, she heavily relied on full liquid diet and understands the types of foods to include to help meet her needs when her next treatment comes  around. Pt said her and oncology MD discussed plan for treatment involving a combination of chemotherapy and radiation.   Pt reports typical weight around 132# and her current weight is 126.5#. Per chart review, she has regained weight since last treatment and has maintained weight for 4 months so the reported 132# may be from prior to last cancer treatment. NFPE showed mild/ moderate depletions which may be related to acute illness within the last 2 weeks.  Pt plan is for discharge today.  Medications reviewed  Labs reviewed  Nutrition Focused Physical Exam:  Flowsheet Row Most Recent Value  Orbital Region Mild depletion  Upper Arm Region No depletion  Thoracic and Lumbar Region No depletion  Buccal Region Mild depletion  Temple Region Mild depletion  Clavicle Bone Region Moderate depletion  Clavicle and Acromion Bone Region Moderate depletion  Scapular Bone Region No depletion  Dorsal Hand No depletion  Patellar Region Mild depletion  Anterior Thigh Region Mild depletion  Posterior Calf Region Mild depletion  Edema (RD Assessment) None  Hair Reviewed  Eyes Reviewed  [slightly yellow, pale]  Mouth Reviewed  [mucositis, tongue inflammed]  Skin Reviewed  Nails Reviewed   Diet Order:   Diet Order             DIET SOFT Room service appropriate? Yes; Fluid consistency: Thin  Diet effective now                  EDUCATION NEEDS:  Education needs have been addressed  Skin:  Skin Assessment: Reviewed RN Assessment  Last BM:  per chart review, last bm 2/25  Height:  Ht  Readings from Last 1 Encounters:  01/27/24 5\' 3"  (1.6 m)   Weight:  Wt Readings from Last 1 Encounters:  01/27/24 57.5 kg   Ideal Body Weight:  52.3 kg  BMI:  Body mass index is 22.46 kg/m.  Estimated Nutritional Needs:  Kcal:  1600-1800  Protein:  85-100g  Fluid:  >/= 2L  Louis Meckel Dietetic Intern

## 2024-01-30 NOTE — Progress Notes (Signed)
 Subjective: Feeling better.  Eating better.  Objective: Vital signs in last 24 hours: Temp:  [98.2 F (36.8 C)-99.3 F (37.4 C)] 98.6 F (37 C) (02/26 0516) Pulse Rate:  [57-72] 57 (02/26 0516) Resp:  [18] 18 (02/26 0516) BP: (102-118)/(65-73) 106/65 (02/26 0516) SpO2:  [96 %-100 %] 100 % (02/26 0516) Weight change:  Last BM Date : 01/29/24  PE: GEN:  NAD NEURO:  No encephalopathy  Lab Results: CBC    Component Value Date/Time   WBC 8.9 01/30/2024 0533   RBC 3.85 (L) 01/30/2024 0533   HGB 11.9 (L) 01/30/2024 0533   HGB 14.2 11/05/2023 1259   HCT 36.5 01/30/2024 0533   PLT 242 01/30/2024 0533   PLT 257 11/05/2023 1259   MCV 94.8 01/30/2024 0533   MCH 30.9 01/30/2024 0533   MCHC 32.6 01/30/2024 0533   RDW 12.9 01/30/2024 0533   LYMPHSABS 1.8 01/28/2024 0509   MONOABS 0.9 01/28/2024 0509   EOSABS 0.1 01/28/2024 0509   BASOSABS 0.0 01/28/2024 0509  CMP     Component Value Date/Time   NA 137 01/30/2024 0533   K 3.6 01/30/2024 0533   CL 103 01/30/2024 0533   CO2 26 01/30/2024 0533   GLUCOSE 98 01/30/2024 0533   BUN 6 01/30/2024 0533   CREATININE 0.78 01/30/2024 0533   CREATININE 0.95 11/05/2023 1259   CALCIUM 8.7 (L) 01/30/2024 0533   PROT 7.9 01/26/2024 1705   ALBUMIN 3.1 (L) 01/28/2024 0509   AST 13 (L) 01/26/2024 1705   AST 16 11/05/2023 1259   ALT 8 01/26/2024 1705   ALT 8 11/05/2023 1259   ALKPHOS 82 01/26/2024 1705   BILITOT 0.5 01/26/2024 1705   BILITOT 0.4 11/05/2023 1259   GFRNONAA >60 01/30/2024 0533   GFRNONAA >60 11/05/2023 1259    Assessment:   Abdominal pain, nausea, vomiting.  Improving. Malignant-appearing gastric ulcer, biopsies pending, history of MALT lymphoma.  Plan:   PPI pantoprazole 40 mg po bid as outpatient. Awaiting gastric ulcer biopsies. Soft diet as tolerated. No NSAIDs. OK to discharge home from GI perspective, can follow-up with Dr. Truett Perna if biopsies malignant, may need outpatient follow-up Dr. Levora Angel if  biopsies show no recurrent cancer. [facility] will sign-off.   Nina Horn 01/30/2024, 11:11 AM   Cell [phone] If no answer or after 5 PM call [phone]

## 2024-01-30 NOTE — Discharge Summary (Signed)
 Physician Discharge Summary  Nina Horn:[id number] DOB: 03/27/1971 DOA: 01/26/2024  PCP: Norm Salt, PA  Admit date: 01/26/2024 Discharge date: 01/30/2024  Admitted From: Home Disposition: Home  Recommendations for Outpatient Follow-up:  Follow up with PCP in 1-2 weeks Please obtain BMP/CBC in one week your next doctors visit.  PPI twice daily prescribed.  Follow-up outpatient with [facility] and oncology.  Gastric biopsy results will be reviewed at that time.  Discharge Condition: Stable CODE STATUS: Full code Diet recommendation: Regular  Brief/Interim Summary: Brief Narrative:  53 year old female, with past medical history significant for gastric cancer (MALT lymphoma), status post treatment with 4 cycles of rituximab. Patient presented with 2-day history of abdominal pain, nausea and vomiting. Last vomiting was about 2 hours ago. Patient continues to have nausea.  Eventually EGD showed malignant appearing ulcer, pathology was sent and pending at the time of discharge.  Discussed with GI and oncology, they will follow-up outpatient.  Will discharge on PPI twice daily as patient is feeling much better and wants to go home.   Assessment & Plan:  Principal Problem:   Gastric lymphoma (HCC) Active Problems:   Nausea and vomiting   Diffuse abdominal pain      Abdominal pain in the setting of gastric lymphoma status postchemotherapy in August 2024, completed 4 cycles of rituximab: -Contrast-enhanced CT abdomen and pelvis revealed the following findings: Mild wall thickening of the distal gastric body, nonspecific but suggesting residual gastric lymphoma.  Small upper abdominal nodes, grossly unchanged.  Small peritoneal nodules, stable versus mildly progressive.  Tolerating p.o., abdominal pain has resolved. -History of MALT lymphoma.  -Endoscopy 2/24-malignant gastric ulcer, pending pathology.  This will be followed up outpatient by oncology -PPI twice daily    Leukocytosis. Resolved.  Likely reactive.   Sinus bradycardia, asymptomatic. 12-lead EKG with sinus bradycardia rate of 54. Monitor on telemetry for now   DVT prophylaxis: Subcutaneous Lovenox. Code Status: Full code. Family Communication:  Disposition Plan: Discharge today.  Cleared by oncology and GI  Subjective: Doing better. Toleraing PO Wants it go home.   Examination:  General exam: Appears calm and comfortable  Respiratory system: Clear to auscultation. Respiratory effort normal. Cardiovascular system: S1 & S2 heard, RRR. No JVD, murmurs, rubs, gallops or clicks. No pedal edema. Gastrointestinal system: Abdomen is nondistended, soft and nontender. No organomegaly or masses felt. Normal bowel sounds heard. Central nervous system: Alert and oriented. No focal neurological deficits. Extremities: Symmetric 5 x 5 power. Skin: No rashes, lesions or ulcers Psychiatry: Judgement and insight appear normal. Mood & affect appropriate.    Discharge Diagnoses:  Principal Problem:   Gastric lymphoma (HCC) Active Problems:   Nausea and vomiting   Diffuse abdominal pain      Discharge Exam: Vitals:   01/29/24 2117 01/30/24 0516  BP: 102/67 106/65  Pulse: 66 (!) 57  Resp: 18 18  Temp: 99.3 F (37.4 C) 98.6 F (37 C)  SpO2: 100% 100%   Vitals:   01/29/24 0429 01/29/24 1254 01/29/24 2117 01/30/24 0516  BP: 104/64 118/73 102/67 106/65  Pulse: 71 72 66 (!) 57  Resp: 20 18 18 18   Temp: 99.3 F (37.4 C) 98.2 F (36.8 C) 99.3 F (37.4 C) 98.6 F (37 C)  TempSrc: Oral Oral Oral Oral  SpO2: 99% 96% 100% 100%  Weight:      Height:          Discharge Instructions   Allergies as of 01/30/2024  Reactions   Promethazine Hcl Other (See Comments)   delirium   Rituximab-pvvr Nausea And Vomiting   Patient had hypersensitivity reaction to Rituxan. See progress note from 06/14/2023 at 1231. Patient able to complete infusion.        Medication List     TAKE  these medications    acetaminophen 500 MG tablet Commonly known as: TYLENOL Take 500-1,000 mg by mouth every 6 (six) hours as needed for moderate pain (pain score 4-6).   magic mouthwash (nystatin, lidocaine, diphenhydrAMINE, alum & mag hydroxide) suspension Take 2 mLs by mouth 4 (four) times daily for 7 days.   ondansetron 4 MG tablet Commonly known as: ZOFRAN Take 4 mg by mouth every 8 (eight) hours as needed for vomiting or nausea.   oxyCODONE 5 MG immediate release tablet Commonly known as: Oxy IR/ROXICODONE Take 1 tablet (5 mg total) by mouth every 6 (six) hours as needed.   pantoprazole 40 MG tablet Commonly known as: PROTONIX Take 1 tablet (40 mg total) by mouth 2 (two) times daily before a meal. What changed: when to take this   polyethylene glycol 17 g packet Commonly known as: MIRALAX / GLYCOLAX Take 17 g by mouth daily as needed for moderate constipation or severe constipation.   SCOPOLAMINE TD Place 1 patch onto the skin every 3 (three) days as needed (N/V).   senna-docusate 8.6-50 MG tablet Commonly known as: Senokot-S Take 1 tablet by mouth at bedtime as needed for mild constipation.        Follow-up Information     Norm Salt, PA Follow up in 1 week(s).   Specialty: Physician Assistant Contact information: [address] [place] [address] [phone]                Allergies  Allergen Reactions   Promethazine Hcl Other (See Comments)    delirium   Rituximab-Pvvr Nausea And Vomiting    Patient had hypersensitivity reaction to Rituxan. See progress note from 06/14/2023 at 1231. Patient able to complete infusion.    You were cared for by a hospitalist during your hospital stay. If you have any questions about your discharge medications or the care you received while you were in the hospital after you are discharged, you can call the unit and asked to speak with the hospitalist on call if the hospitalist that took care of you is  not available. Once you are discharged, your primary care physician will handle any further medical issues. Please note that no refills for any discharge medications will be authorized once you are discharged, as it is imperative that you return to your primary care physician (or establish a relationship with a primary care physician if you do not have one) for your aftercare needs so that they can reassess your need for medications and monitor your lab values.  You were cared for by a hospitalist during your hospital stay. If you have any questions about your discharge medications or the care you received while you were in the hospital after you are discharged, you can call the unit and asked to speak with the hospitalist on call if the hospitalist that took care of you is not available. Once you are discharged, your primary care physician will handle any further medical issues. Please note that NO REFILLS for any discharge medications will be authorized once you are discharged, as it is imperative that you return to your primary care physician (or establish a relationship with a primary care physician  if you do not have one) for your aftercare needs so that they can reassess your need for medications and monitor your lab values.  Please request your Prim.MD to go over all Hospital Tests and Procedure/Radiological results at the follow up, please get all Hospital records sent to your Prim MD by signing hospital release before you go home.  Get CBC, CMP, 2 view Chest X ray checked  by Primary MD during your next visit or SNF MD in 5-7 days ( we routinely change or add medications that can affect your baseline labs and fluid status, therefore we recommend that you get the mentioned basic workup next visit with your PCP, your PCP may decide not to get them or add new tests based on their clinical decision)  On your next visit with your primary care physician please Get Medicines reviewed and adjusted.  If  you experience worsening of your admission symptoms, develop shortness of breath, life threatening emergency, suicidal or homicidal thoughts you must seek medical attention immediately by calling 911 or calling your MD immediately  if symptoms less severe.  You Must read complete instructions/literature along with all the possible adverse reactions/side effects for all the Medicines you take and that have been prescribed to you. Take any new Medicines after you have completely understood and accpet all the possible adverse reactions/side effects.   Do not drive, operate heavy machinery, perform activities at heights, swimming or participation in water activities or provide baby sitting services if your were admitted for syncope or siezures until you have seen by Primary MD or a Neurologist and advised to do so again.  Do not drive when taking Pain medications.   Procedures/Studies: CT ABDOMEN PELVIS W CONTRAST Result Date: 01/26/2024 CLINICAL DATA:  Epigastric pain, MALT lymphoma, status post chemotherapy EXAM: CT ABDOMEN AND PELVIS WITH CONTRAST TECHNIQUE: Multidetector CT imaging of the abdomen and pelvis was performed using the standard protocol following bolus administration of intravenous contrast. RADIATION DOSE REDUCTION: This exam was performed according to the departmental dose-optimization program which includes automated exposure control, adjustment of the mA and/or kV according to patient size and/or use of iterative reconstruction technique. CONTRAST:  OMNIPAQUE IOHEXOL 300 MG/ML  SOLN COMPARISON:  11/05/2023 FINDINGS: Lower chest: Lung bases are clear. Hepatobiliary: Liver is within normal limits. Gallbladder is unremarkable. No intrahepatic or extrahepatic ductal dilatation. Pancreas: Within normal limits. Spleen: Within normal limits. Adrenals/Urinary Tract: Adrenal glands are within normal limits. Kidneys are within normal limits.  No hydronephrosis. Bladder is within normal limits.  Stomach/Bowel: Mild wall thickening involving the distal gastric body (series 2/image 29), nonspecific. However, this corresponds to the site of hypermetabolism on prior PET, raising the possibility of residual gastric lymphoma. No evidence of bowel obstruction. Appendix is not discretely visualized. No colonic wall thickening or inflammatory changes. Vascular/Lymphatic: No evidence of abdominal aortic aneurysm. Residual gastrohepatic nodes measuring up to 10 mm short axis (series 2/image 18), grossly unchanged and mildly FDG avid on prior PET, suggesting residual lymphoma. Reproductive: Uterus is within normal limits. Right ovary is within normal limits.  No left adnexal mass. Other: No abdominopelvic ascites. Small peritoneal nodules, including an 8 mm nodule beneath the left mid anterior abdominal wall (series 2/image 36), slightly more conspicuous than on the prior. Musculoskeletal: Mild degenerative changes of the lower thoracic spine. IMPRESSION: Mild wall thickening of the distal gastric body, nonspecific but suggesting residual gastric lymphoma. Small upper abdominal nodes, grossly unchanged. Small peritoneal nodules, stable versus mildly progressive. Otherwise, no findings  to account for the patient's abdominal pain. Electronically Signed   By: Charline Bills M.D.   On: 01/26/2024 19:10     The results of significant diagnostics from this hospitalization (including imaging, microbiology, ancillary and laboratory) are listed below for reference.     Microbiology: Recent Results (from the past 240 hours)  Resp panel by RT-PCR (RSV, Flu A&B, Covid) Anterior Nasal Swab     Status: None   Collection Time: 01/26/24  9:26 PM   Specimen: Anterior Nasal Swab  Result Value Ref Range Status   SARS Coronavirus 2 by RT PCR NEGATIVE NEGATIVE Final    Comment: (NOTE) SARS-CoV-2 target nucleic acids are NOT DETECTED.  The SARS-CoV-2 RNA is generally detectable in upper respiratory specimens during the  acute phase of infection. The lowest concentration of SARS-CoV-2 viral copies this assay can detect is 138 copies/mL. A negative result does not preclude SARS-Cov-2 infection and should not be used as the sole basis for treatment or other patient management decisions. A negative result may occur with  improper specimen collection/handling, submission of specimen other than nasopharyngeal swab, presence of viral mutation(s) within the areas targeted by this assay, and inadequate number of viral copies(<138 copies/mL). A negative result must be combined with clinical observations, patient history, and epidemiological information. The expected result is Negative.  Fact Sheet for Patients:  BloggerCourse.com  Fact Sheet for Healthcare Providers:  SeriousBroker.it  This test is no t yet approved or cleared by the [place] FDA and  has been authorized for detection and/or diagnosis of SARS-CoV-2 by FDA under an Emergency Use Authorization (EUA). This EUA will remain  in effect (meaning this test can be used) for the duration of the COVID-19 declaration under Section 564(b)(1) of the Act, 21 U.S.C.section 360bbb-3(b)(1), unless the authorization is terminated  or revoked sooner.       Influenza A by PCR NEGATIVE NEGATIVE Final   Influenza B by PCR NEGATIVE NEGATIVE Final    Comment: (NOTE) The Xpert Xpress SARS-CoV-2/FLU/RSV plus assay is intended as an aid in the diagnosis of influenza from Nasopharyngeal swab specimens and should not be used as a sole basis for treatment. Nasal washings and aspirates are unacceptable for Xpert Xpress SARS-CoV-2/FLU/RSV testing.  Fact Sheet for Patients: BloggerCourse.com  Fact Sheet for Healthcare Providers: SeriousBroker.it  This test is not yet approved or cleared by the [place] FDA and has been authorized for detection and/or  diagnosis of SARS-CoV-2 by FDA under an Emergency Use Authorization (EUA). This EUA will remain in effect (meaning this test can be used) for the duration of the COVID-19 declaration under Section 564(b)(1) of the Act, 21 U.S.C. section 360bbb-3(b)(1), unless the authorization is terminated or revoked.     Resp Syncytial Virus by PCR NEGATIVE NEGATIVE Final    Comment: (NOTE) Fact Sheet for Patients: BloggerCourse.com  Fact Sheet for Healthcare Providers: SeriousBroker.it  This test is not yet approved or cleared by the [place] FDA and has been authorized for detection and/or diagnosis of SARS-CoV-2 by FDA under an Emergency Use Authorization (EUA). This EUA will remain in effect (meaning this test can be used) for the duration of the COVID-19 declaration under Section 564(b)(1) of the Act, 21 U.S.C. section 360bbb-3(b)(1), unless the authorization is terminated or revoked.  Performed at [facility], [address] [place], [place] [address]      Labs: BNP (last 3 results) No results for input(s): "BNP" in the last 8760 hours. Basic Metabolic Panel: Recent Labs  Lab 01/26/24 1705 01/27/24 0757 01/28/24 0509 01/29/24 0510 01/30/24 0533  NA 139 140 138 143 137  K 3.6 3.7 3.6 3.6 3.6  CL 101 105 103 104 103  CO2 26 26 26 29 26   GLUCOSE 100* 90 81 90 98  BUN 10 12 12 7 6   CREATININE 0.80 0.69 0.68 0.83 0.78  CALCIUM 10.0 9.0 8.8* 9.4 8.7*  MG  --  2.0 1.8 1.8 2.1  PHOS  --  3.6 3.5 3.2  --    Liver Function Tests: Recent Labs  Lab 01/26/24 1705 01/28/24 0509  AST 13*  --   ALT 8  --   ALKPHOS 82  --   BILITOT 0.5  --   PROT 7.9  --   ALBUMIN 4.7 3.1*   Recent Labs  Lab 01/26/24 1705  LIPASE 33   No results for input(s): "AMMONIA" in the last 168 hours. CBC: Recent Labs  Lab 01/26/24 1705 01/27/24 0757 01/28/24 0509 01/29/24 0510 01/30/24 0533  WBC 12.3* 10.9* 9.9 10.6*  8.9  NEUTROABS 9.7*  --  6.9  --   --   HGB 13.7 11.9* 11.3* 12.3 11.9*  HCT 41.4 37.4 35.6* 37.9 36.5  MCV 90.8 94.9 95.7 93.8 94.8  PLT 277 228 228 258 242   Cardiac Enzymes: No results for input(s): "CKTOTAL", "CKMB", "CKMBINDEX", "TROPONINI" in the last 168 hours. BNP: Invalid input(s): "POCBNP" CBG: No results for input(s): "GLUCAP" in the last 168 hours. D-Dimer No results for input(s): "DDIMER" in the last 72 hours. Hgb A1c No results for input(s): "HGBA1C" in the last 72 hours. Lipid Profile No results for input(s): "CHOL", "HDL", "LDLCALC", "TRIG", "CHOLHDL", "LDLDIRECT" in the last 72 hours. Thyroid function studies No results for input(s): "TSH", "T4TOTAL", "T3FREE", "THYROIDAB" in the last 72 hours.  Invalid input(s): "FREET3" Anemia work up No results for input(s): "VITAMINB12", "FOLATE", "FERRITIN", "TIBC", "IRON", "RETICCTPCT" in the last 72 hours. Urinalysis    Component Value Date/Time   COLORURINE YELLOW 01/26/2024 2137   APPEARANCEUR CLEAR 01/26/2024 2137   LABSPEC >1.046 (H) 01/26/2024 2137   PHURINE 6.0 01/26/2024 2137   GLUCOSEU NEGATIVE 01/26/2024 2137   HGBUR MODERATE (A) 01/26/2024 2137   BILIRUBINUR NEGATIVE 01/26/2024 2137   KETONESUR 40 (A) 01/26/2024 2137   PROTEINUR 30 (A) 01/26/2024 2137   UROBILINOGEN 0.2 12/08/2012 0240   NITRITE NEGATIVE 01/26/2024 2137   LEUKOCYTESUR NEGATIVE 01/26/2024 2137   Sepsis Labs Recent Labs  Lab 01/27/24 0757 01/28/24 0509 01/29/24 0510 01/30/24 0533  WBC 10.9* 9.9 10.6* 8.9   Microbiology Recent Results (from the past 240 hours)  Resp panel by RT-PCR (RSV, Flu A&B, Covid) Anterior Nasal Swab     Status: None   Collection Time: 01/26/24  9:26 PM   Specimen: Anterior Nasal Swab  Result Value Ref Range Status   SARS Coronavirus 2 by RT PCR NEGATIVE NEGATIVE Final    Comment: (NOTE) SARS-CoV-2 target nucleic acids are NOT DETECTED.  The SARS-CoV-2 RNA is generally detectable in upper  respiratory specimens during the acute phase of infection. The lowest concentration of SARS-CoV-2 viral copies this assay can detect is 138 copies/mL. A negative result does not preclude SARS-Cov-2 infection and should not be used as the sole basis for treatment or other patient management decisions. A negative result may occur with  improper specimen collection/handling, submission of specimen other than nasopharyngeal swab, presence of viral mutation(s) within the areas targeted by this assay, and inadequate number of viral copies(<138 copies/mL). A negative  result must be combined with clinical observations, patient history, and epidemiological information. The expected result is Negative.  Fact Sheet for Patients:  BloggerCourse.com  Fact Sheet for Healthcare Providers:  SeriousBroker.it  This test is no t yet approved or cleared by the [place] FDA and  has been authorized for detection and/or diagnosis of SARS-CoV-2 by FDA under an Emergency Use Authorization (EUA). This EUA will remain  in effect (meaning this test can be used) for the duration of the COVID-19 declaration under Section 564(b)(1) of the Act, 21 U.S.C.section 360bbb-3(b)(1), unless the authorization is terminated  or revoked sooner.       Influenza A by PCR NEGATIVE NEGATIVE Final   Influenza B by PCR NEGATIVE NEGATIVE Final    Comment: (NOTE) The Xpert Xpress SARS-CoV-2/FLU/RSV plus assay is intended as an aid in the diagnosis of influenza from Nasopharyngeal swab specimens and should not be used as a sole basis for treatment. Nasal washings and aspirates are unacceptable for Xpert Xpress SARS-CoV-2/FLU/RSV testing.  Fact Sheet for Patients: BloggerCourse.com  Fact Sheet for Healthcare Providers: SeriousBroker.it  This test is not yet approved or cleared by the [place] FDA and has been  authorized for detection and/or diagnosis of SARS-CoV-2 by FDA under an Emergency Use Authorization (EUA). This EUA will remain in effect (meaning this test can be used) for the duration of the COVID-19 declaration under Section 564(b)(1) of the Act, 21 U.S.C. section 360bbb-3(b)(1), unless the authorization is terminated or revoked.     Resp Syncytial Virus by PCR NEGATIVE NEGATIVE Final    Comment: (NOTE) Fact Sheet for Patients: BloggerCourse.com  Fact Sheet for Healthcare Providers: SeriousBroker.it  This test is not yet approved or cleared by the [place] FDA and has been authorized for detection and/or diagnosis of SARS-CoV-2 by FDA under an Emergency Use Authorization (EUA). This EUA will remain in effect (meaning this test can be used) for the duration of the COVID-19 declaration under Section 564(b)(1) of the Act, 21 U.S.C. section 360bbb-3(b)(1), unless the authorization is terminated or revoked.  Performed at [facility], [address] [place], [place] [address]      Time coordinating discharge:  I have spent 35 minutes face to face with the patient and on the ward discussing the patients care, assessment, plan and disposition with other care givers. >50% of the time was devoted counseling the patient about the risks and benefits of treatment/Discharge disposition and coordinating care.   SIGNED:   Miguel Rota, MD  [facility] 01/30/2024, 12:45 PM   If 7PM-7AM, please contact night-coverage

## 2024-01-30 NOTE — Progress Notes (Addendum)
 Nina Horn   DOB:[birth year]   ZO#:[id number]      ASSESSMENT & PLAN:  1.  Abdominal pain Gastric ulcers - Likely related to malignancy and gastric ulcers - Admitted with worsening abdominal pain which she reports is much better today.     - GI eval done.  S/p endoscopy 01/28/2024.  Biopsy results pending.   - Continue PPI as ordered, GI following - on liquid diet, tolerating fairly well. 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 Truett Perna following.  Okay for discharge from oncology viewpoint.  Follow up outpatient.    3.  Nausea/Vomiting - likely due to abdominal pain and gastric ulcers - S/p endoscopy on 01/28/2024 - Administer antiemetics as ordered - Supportive care   4.  Anemia, normocytic - Hemoglobin stable 11.9 today  - No intervention warranted at this time - Continue to monitor CBC with differential   5.  Phlebitis, right UE - Continue warm compresses prn  -Afebrile, monitor fever curve   6.  Oral mucositis - Noted on inside of bottom and top lips - Continue Magic mouthwash with lidocaine 4 times daily     Code Status Full  Subjective:  Patient seen awake and alert sitting in chair at bedside.  Reports feels much better today and is looking forward to going home.  Also happy that her missing 34 year old son was found and is home.  No other acute distress is noted.   Objective:  Vitals:   01/29/24 2117 01/30/24 0516  BP: 102/67 106/65  Pulse: 66 (!) 57  Resp: 18 18  Temp: 99.3 F (37.4 C) 98.6 F (37 C)  SpO2: 100% 100%     Intake/Output Summary (Last 24 hours) at 01/30/2024 0920 Last data filed at 01/29/2024 1737 Gross per 24 hour  Intake 480 ml  Output --  Net 480 ml     REVIEW OF SYSTEMS:   Constitutional: Denies fevers, chills or abnormal night sweats Eyes: Denies blurriness of vision, double vision or watery eyes Ears, nose,  mouth, throat, and face: Denies mucositis or sore throat Respiratory: Denies cough, dyspnea or wheezes Cardiovascular: Denies palpitation, chest discomfort or lower extremity swelling Gastrointestinal:  +abdominal pain Skin: Denies abnormal skin rashes Lymphatics: Denies new lymphadenopathy or easy bruising Neurological: Denies numbness, tingling or new weaknesses Behavioral/Psych: Mood is stable, no new changes  All other systems were reviewed with the patient and are negative.  PHYSICAL EXAMINATION: ECOG PERFORMANCE STATUS: 1 - Symptomatic but completely ambulatory  Vitals:   01/29/24 2117 01/30/24 0516  BP: 102/67 106/65  Pulse: 66 (!) 57  Resp: 18 18  Temp: 99.3 F (37.4 C) 98.6 F (37 C)  SpO2: 100% 100%   Filed Weights   01/27/24 0247  Weight: 126 lb 12.2 oz (57.5 kg)    GENERAL: alert, no distress and comfortable SKIN: skin color, texture, turgor are normal, no rashes or significant lesions EYES: normal, conjunctiva are pink and non-injected, sclera clear OROPHARYNX: no exudate, no erythema and lips, buccal mucosa, and tongue normal  NECK: supple, thyroid normal size, non-tender, without nodularity LYMPH: no palpable lymphadenopathy in the cervical, axillary or inguinal LUNGS: clear to auscultation and percussion with normal breathing effort HEART: regular rate & rhythm and no murmurs and no lower extremity edema ABDOMEN: abdomen soft, non-tender and normal bowel sounds MUSCULOSKELETAL: no cyanosis of digits and no clubbing  PSYCH: alert &  oriented x 3 with fluent speech NEURO: no focal motor/sensory deficits Vascular: Erythema and palpable cord at the right antecubital area, no arm edema  All questions were answered. The patient knows to call the clinic with any problems, questions or concerns.   The total time spent in the appointment was 40 minutes encounter with patient including review of chart and various tests results, discussions about plan of care and  coordination of care plan  Dawson Bills, NP 01/30/2024 9:20 AM    Labs Reviewed:  Lab Results  Component Value Date   WBC 8.9 01/30/2024   HGB 11.9 (L) 01/30/2024   HCT 36.5 01/30/2024   MCV 94.8 01/30/2024   PLT 242 01/30/2024   Recent Labs    09/04/23 1450 11/05/23 1259 01/26/24 1705 01/27/24 0757 01/28/24 0509 01/29/24 0510 01/30/24 0533  NA 140 140 139   < > 138 143 137  K 3.7 4.2 3.6   < > 3.6 3.6 3.6  CL 105 102 101   < > 103 104 103  CO2 27 29 26    < > 26 29 26   GLUCOSE 102* 99 100*   < > 81 90 98  BUN 11 12 10    < > 12 7 6   CREATININE 0.75 0.95 0.80   < > 0.68 0.83 0.78  CALCIUM 9.4 9.8 10.0   < > 8.8* 9.4 8.7*  GFRNONAA >60 >60 >60   < > >60 >60 >60  PROT 6.9 7.2 7.9  --   --   --   --   ALBUMIN 4.3 4.6 4.7  --  3.1*  --   --   AST 12* 16 13*  --   --   --   --   ALT 6 8 8   --   --   --   --   ALKPHOS 59 68 82  --   --   --   --   BILITOT 0.3 0.4 0.5  --   --   --   --    < > = values in this interval not displayed.    Studies Reviewed:  CT ABDOMEN PELVIS W CONTRAST Result Date: 01/26/2024 CLINICAL DATA:  Epigastric pain, MALT lymphoma, status post chemotherapy EXAM: CT ABDOMEN AND PELVIS WITH CONTRAST TECHNIQUE: Multidetector CT imaging of the abdomen and pelvis was performed using the Horn protocol following bolus administration of intravenous contrast. RADIATION DOSE REDUCTION: This exam was performed according to the departmental dose-optimization program which includes automated exposure control, adjustment of the mA and/or kV according to patient size and/or use of iterative reconstruction technique. CONTRAST:  OMNIPAQUE IOHEXOL 300 MG/ML  SOLN COMPARISON:  11/05/2023 FINDINGS: Lower chest: Lung bases are clear. Hepatobiliary: Liver is within normal limits. Gallbladder is unremarkable. No intrahepatic or extrahepatic ductal dilatation. Pancreas: Within normal limits. Spleen: Within normal limits. Adrenals/Urinary Tract: Adrenal glands are within  normal limits. Kidneys are within normal limits.  No hydronephrosis. Bladder is within normal limits. Stomach/Bowel: Mild wall thickening involving the distal gastric body (series 2/image 29), nonspecific. However, this corresponds to the site of hypermetabolism on prior PET, raising the possibility of residual gastric lymphoma. No evidence of bowel obstruction. Appendix is not discretely visualized. No colonic wall thickening or inflammatory changes. Vascular/Lymphatic: No evidence of abdominal aortic aneurysm. Residual gastrohepatic nodes measuring up to 10 mm short axis (series 2/image 18), grossly unchanged and mildly FDG avid on prior PET, suggesting residual lymphoma. Reproductive: Uterus is within normal limits. Right ovary  is within normal limits.  No left adnexal mass. Other: No abdominopelvic ascites. Small peritoneal nodules, including an 8 mm nodule beneath the left mid anterior abdominal wall (series 2/image 36), slightly more conspicuous than on the prior. Musculoskeletal: Mild degenerative changes of the lower thoracic spine. IMPRESSION: Mild wall thickening of the distal gastric body, nonspecific but suggesting residual gastric lymphoma. Small upper abdominal nodes, grossly unchanged. Small peritoneal nodules, stable versus mildly progressive. Otherwise, no findings to account for the patient's abdominal pain. Electronically Signed   By: Charline Bills M.D.   On: 01/26/2024 19:10   Ms. Jacquez was interviewed and examined.  She reports improvement in nausea and abdominal pain.  She wants to go home.  The pathology from the gastric ulcer biopsy is pending.  She has persistent evidence of thrombophlebitis at a right arm IV site.  Recommendations: Okay for discharge oncology standpoint Outpatient follow-up will be scheduled at the Cancer center for within the next week Consider course of aspirin for the right arm phlebitis

## 2024-01-31 ENCOUNTER — Telehealth: Payer: Self-pay

## 2024-01-31 NOTE — Telephone Encounter (Signed)
 Tried to contact patient to set up an appointment for 02/07/2024 per Secure chat with Lonna Cobb, NP on 01/31/24. Patients number not in service, also tried calling her emergency contacts. I sent her a message on her mychart with appointment date, time and provider. 01/31/24 @ 2:13pM

## 2024-02-04 LAB — SURGICAL PATHOLOGY

## 2024-02-07 ENCOUNTER — Encounter: Payer: Self-pay | Admitting: Nurse Practitioner

## 2024-02-07 ENCOUNTER — Inpatient Hospital Stay: Payer: Medicaid Other | Attending: Oncology | Admitting: Nurse Practitioner

## 2024-02-07 VITALS — BP 124/78 | HR 64 | Temp 98.3°F | Resp 16 | Ht 63.0 in | Wt 123.7 lb

## 2024-02-07 DIAGNOSIS — C884 Extranodal marginal zone b-cell lymphoma of mucosa-associated lymphoid tissue (malt-lymphoma) not having achieved remission: Secondary | ICD-10-CM | POA: Diagnosis present

## 2024-02-07 NOTE — Progress Notes (Signed)
 PHQ-19, denied speaking with Child psychotherapist, treatment/ counseling. Provider made aware.  Provider placed referral to Radiation Oncology for a consultation with Dr. Roselind Messier due to recurrent gastric MALT lymphoma. Contacted rebecca lopez referral coordinator received referral, actively working on it. Will reach out if anything else is needed.   Contacted Tammy Brooks/emailed at [facility] Pathology requesting to add an H. Pylori test to 01/28/24 surgical pathology report per Dr. Truett Perna Santiago Glad NP  DOB- [birth year] Case # [id number] Stated per Dr. Delphia Grates test will be added today.

## 2024-02-07 NOTE — Progress Notes (Addendum)
 Patient Care Team: Norm Salt, [place] as PCP - General (Physician Assistant) Ladene Artist, MD as Consulting Physician (Oncology) Lynann Bologna, DO as Consulting Physician (Gastroenterology)   CHIEF COMPLAINT: Hospital follow-up, gastric MALT lymphoma  Oncology History  MALT lymphoma  06/07/2023 Initial Diagnosis   MALT lymphoma (HCC)   06/07/2023 Cancer Staging   Staging form: Hodgkin and Non-Hodgkin Lymphoma, AJCC 8th Edition - Clinical: Stage IV (Unknown) - Signed by Ladene Artist, MD on 06/07/2023   06/14/2023 -  Chemotherapy   Patient is on Treatment Plan : NON-HODGKINS LYMPHOMA Rituximab q7d        CURRENT THERAPY: Observation  INTERVAL HISTORY Nina Horn presents for hospital follow-up, last seen in clinic by Dr. Truett Perna 11/12/2023.  She presented to ED 01/26/2024 with abd pain and vomiting.  She underwent EGD by Dr. Dulce Sellar. She was seen by Dr. Truett Perna in the hospital and discharged on 2/26 with outpatient follow-up.  She presents today by herself, feeling okay but still recovering from hospitalization.  She stopped needing oxycodone 2 days ago. Stomach occasionally feels "sour" without vomiting.  She is not sure what to eat, weight is down some, and having trouble locating Nestl breeze.  Required stool softener and MiraLAX until stools normalized a day ago.  Denies fever, drenching sweats, or new adenopathy.   ROS  All other systems reviewed and negative  Past Medical History:  Diagnosis Date   Cancer (HCC)    Depression    Migraines      Past Surgical History:  Procedure Laterality Date   BIOPSY  01/28/2024   Procedure: BIOPSY;  Surgeon: Willis Modena, MD;  Location: [facility];  Service: Gastroenterology;;   ESOPHAGOGASTRODUODENOSCOPY (EGD) WITH PROPOFOL Left 01/28/2024   Procedure: ESOPHAGOGASTRODUODENOSCOPY (EGD) WITH PROPOFOL;  Surgeon: Willis Modena, MD;  Location: [facility];  Service: Gastroenterology;  Laterality: Left;   PILONIDAL  CYST EXCISION     WISDOM TOOTH EXTRACTION       Outpatient Encounter Medications as of 02/07/2024  Medication Sig Note   acetaminophen (TYLENOL) 500 MG tablet Take 500-1,000 mg by mouth every 6 (six) hours as needed for moderate pain (pain score 4-6).    lidocaine (XYLOCAINE) 2 % solution Use as directed 15 mLs in the mouth or throat 2 (two) times daily.    ondansetron (ZOFRAN) 4 MG tablet Take 4 mg by mouth every 8 (eight) hours as needed for vomiting or nausea.    pantoprazole (PROTONIX) 40 MG tablet Take 1 tablet (40 mg total) by mouth 2 (two) times daily before a meal.    polyethylene glycol (MIRALAX / GLYCOLAX) 17 g packet Take 17 g by mouth daily as needed for moderate constipation or severe constipation.    Scopolamine Base (SCOPOLAMINE TD) Place 1 patch onto the skin every 3 (three) days as needed (N/V). 01/27/2024: >30 days. Has if needed.    senna-docusate (SENOKOT-S) 8.6-50 MG tablet Take 1 tablet by mouth at bedtime as needed for mild constipation.    oxyCODONE (OXY IR/ROXICODONE) 5 MG immediate release tablet Take 1 tablet (5 mg total) by mouth every 6 (six) hours as needed. (Patient not taking: Reported on 02/07/2024)    [DISCONTINUED] ranitidine (ZANTAC) 150 MG tablet Take 1 tablet (150 mg total) by mouth 2 (two) times daily. (Patient not taking: Reported on 10/13/2018)    No facility-administered encounter medications on file as of 02/07/2024.     Today's Vitals   02/07/24 1045 02/07/24 1104  BP: 124/78  Pulse: 64   Resp: 16   Temp: 98.3 F (36.8 C)   TempSrc: Temporal   SpO2: 100%   Weight: 123 lb 11.2 oz (56.1 kg)   Height: 5\' 3"  (1.6 m)   PainSc:  0-No pain   Body mass index is 21.91 kg/m.   PHYSICAL EXAM GENERAL:alert, no distress and comfortable SKIN: no rash  EYES: sclera clear NECK: without mass LYMPH:  no palpable cervical or supraclavicular lymphadenopathy  LUNGS: clear with normal breathing effort HEART: regular rate & rhythm, no lower extremity  edema ABDOMEN: abdomen soft, non-tender and normal bowel sounds NEURO: alert & oriented x 3 with fluent speech, no focal motor/sensory deficits    CBC    Component Value Date/Time   WBC 8.9 01/30/2024 0533   RBC 3.85 (L) 01/30/2024 0533   HGB 11.9 (L) 01/30/2024 0533   HGB 14.2 11/05/2023 1259   HCT 36.5 01/30/2024 0533   PLT 242 01/30/2024 0533   PLT 257 11/05/2023 1259   MCV 94.8 01/30/2024 0533   MCH 30.9 01/30/2024 0533   MCHC 32.6 01/30/2024 0533   RDW 12.9 01/30/2024 0533   LYMPHSABS 1.8 01/28/2024 0509   MONOABS 0.9 01/28/2024 0509   EOSABS 0.1 01/28/2024 0509   BASOSABS 0.0 01/28/2024 0509     CMP     Component Value Date/Time   NA 137 01/30/2024 0533   K 3.6 01/30/2024 0533   CL 103 01/30/2024 0533   CO2 26 01/30/2024 0533   GLUCOSE 98 01/30/2024 0533   BUN 6 01/30/2024 0533   CREATININE 0.78 01/30/2024 0533   CREATININE 0.95 11/05/2023 1259   CALCIUM 8.7 (L) 01/30/2024 0533   PROT 7.9 01/26/2024 1705   ALBUMIN 3.1 (L) 01/28/2024 0509   AST 13 (L) 01/26/2024 1705   AST 16 11/05/2023 1259   ALT 8 01/26/2024 1705   ALT 8 11/05/2023 1259   ALKPHOS 82 01/26/2024 1705   BILITOT 0.5 01/26/2024 1705   BILITOT 0.4 11/05/2023 1259   GFRNONAA >60 01/30/2024 0533   GFRNONAA >60 11/05/2023 1259   GFRAA >60 04/05/2020 1058     ASSESSMENT & PLAN:  MALT lymphoma of the stomach 02/05/2023-EGD-2 nonbleeding gastric ulcers in the incisor and gastric antrum, largest measured 10 mm, congestion, discoloration, nodularity, ulceration, and inflammation in the gastric antrum.  Nodularity and congestion in the gastric body and lesser curvature, gastric cardia, fundus, duodenum appeared normal. Pathology from the gastric antrum and "esophagus "revealed benign ulcerative gastritis with an atypical lymphoid infiltrate H. pylori negative consistent with a MALT lymphoma, B lymphocytes CD20 positive, cyclin D1 negative, B-cell gene rearrangement positive 04/09/2023-EGD-no endoscopic  evidence of ulceration, post ulcer scarring, nodularity in the gastric body, prepyloric region and gastric antrum-biopsy revealed an atypical lymphoid infiltrate consistent with a lymphoid neoplasm CT abdomen/pelvis 05/25/2023-mild wall thickening at the lesser curvature of the distal gastric body, mild gastropathic ligament lymphadenopathy, soft tissue nodules in the gastrocolic ligament CT abdomen/pelvis 05/31/2023-unchanged distal gastric body wall thickening, gastrohepatic lymph nodes PET 06/11/2023-low-level metabolic activity associated with the gastric antrum, a gastrohepatic ligament lymph node, and a peritoneal nodule, normal spleen Cycle 1 rituximab 06/14/2023 Cycle 2 rituximab 06/22/23 Cycle 3 rituximab 06/28/2023 Cycle 4 rituximab 07/05/2023 CT abdomen/pelvis 11/05/2023: Gastropathic ligament adenopathy decreased in size, diffuse gastric mucosal thickening with an under distended stomach-residual disease not excluded, no new abnormality 01/26/24 - 01/30/24 admission for n/v and pain; CT thickening of the distal gastric body.  EGD by Dr. Dulce Sellar showed a nonobstructing nonbleeding cratered gastric ulcer  in the gastric body, path showed atypical lymphoid infiltrate consistent with recurrent MALT lymphoma.     2.  Nausea/vomiting and abdominal pain secondary to #1 3.  05/22/2023-left index finger ganglion cyst excision, left palmar mass excision-benign epidermal inclusion cyst 4.  Leukocytosis-likely secondary to tobacco use 5.  Tobacco use 6.  06/06/2023-readmission with intractable nausea and vomiting     Disposition:  Nina Horn appears improved. We reviewed her hospital course from 2/22 - 2/26, she is recovering well. Pain and GI symptoms are well controlled. I will refer her to nutritionist for assistance with supplements and dietary choices.   Path from the EGD shows recurrent MALT lymphoma. We will request H.Pylori testing and treat the infection if that is positive.   Pt seen with Dr.  Truett Perna who discussed restarting treatment and reviewed chemo options vs radiation. Given that she is symptomatic in the stomach, she would likely benefit best from radiation at this time.   She is being referred for restaging PET scan and radiation oncology consult.  We will call her with PET scan results and see her back after radiation in ~2 months.    Orders Placed This Encounter  Procedures   NM PET Image Restag (PS) Skull Base To Thigh    Standing Status:   Future    Expected Date:   02/14/2024    Expiration Date:   02/06/2025    If indicated for the ordered procedure, I authorize the administration of a radiopharmaceutical per Radiology protocol:   Yes    Is the patient pregnant?:   No    Preferred imaging location?:   [facility]   CBC with Differential (Cancer Center Only)    Standing Status:   Future    Expected Date:   04/08/2024    Expiration Date:   02/06/2025   CMP (Cancer Center only)    Standing Status:   Future    Expected Date:   04/08/2024    Expiration Date:   02/06/2025   Lactate dehydrogenase (LDH)    Standing Status:   Future    Expected Date:   04/08/2024    Expiration Date:   02/06/2025   Ambulatory referral to Radiation Oncology    Referral Priority:   Routine    Referral Type:   Consultation    Referral Reason:   Specialty Services Required    Number of Visits Requested:   1      All questions were answered. The patient knows to call the clinic with any problems, questions or concerns. No barriers to learning were detected.   Santiago Glad, NP-C 02/07/2024  This was a shared visit with Santiago Glad.  Nina Horn has been diagnosed with progressive gastric MALT lymphoma.  She presented with a symptomatic gastric ulcer.  She will be referred for a staging PET and we will request H. pylori testing on the current biopsy.  I discussed treatment options with Nina Horn.  She has a limited tumor burden outside of the stomach.  I recommend referral to radiation  oncology for consideration of radiation to the stomach and perigastric nodes.  Hopefully this will allow her a lengthy treatment free interval prior to the need for systemic therapy.  We also discussed treatment with systemic therapies including a B TK inhibitor.  I was present for greater than 50% of today's visit.  I performed medical decision making.  Mancel Bale, MD

## 2024-02-08 ENCOUNTER — Other Ambulatory Visit: Payer: Self-pay

## 2024-02-11 ENCOUNTER — Ambulatory Visit: Payer: Medicaid Other | Admitting: Oncology

## 2024-02-13 ENCOUNTER — Encounter (HOSPITAL_COMMUNITY)
Admission: RE | Admit: 2024-02-13 | Discharge: 2024-02-13 | Disposition: A | Discharge status: Home / Self Care | Source: Ambulatory Visit | Attending: Nurse Practitioner | Admitting: Nurse Practitioner

## 2024-02-13 DIAGNOSIS — C884 Extranodal marginal zone b-cell lymphoma of mucosa-associated lymphoid tissue (malt-lymphoma) not having achieved remission: Secondary | ICD-10-CM | POA: Diagnosis present

## 2024-02-13 LAB — GLUCOSE, CAPILLARY: Glucose-Capillary: 94 mg/dL (ref 70–99)

## 2024-02-13 MED ORDER — FLUDEOXYGLUCOSE F - 18 (FDG) INJECTION
6.4000 | Freq: Once | INTRAVENOUS | Status: AC
  Administered 2024-02-13: 5.971 via INTRAVENOUS

## 2024-02-14 ENCOUNTER — Other Ambulatory Visit: Payer: Self-pay

## 2024-02-15 ENCOUNTER — Other Ambulatory Visit

## 2024-02-15 NOTE — Progress Notes (Signed)
 GI Location of Tumor / Histology: Stomach  Nina Horn presented with symptoms of: Pain  Biopsies of stomach mass (if applicable) revealed: yes   Past/Anticipated interventions by surgeon, if any: no  Past/Anticipated interventions by medical oncology, if any: yes, Dr. Truett Perna   Weight changes, if any: No Wt Readings from Last 3 Encounters:  02/18/24 126 lb 6 oz (57.3 kg)  02/07/24 123 lb 11.2 oz (56.1 kg)  01/27/24 126 lb 12.2 oz (57.5 kg)     Bowel/Bladder complaints, if any: yes reports intermittent constipation.   Nausea / Vomiting, if any: Nausea at times.   Pain issues, if any:  no  Any blood per rectum:   no  SAFETY ISSUES: Prior radiation? no Pacemaker/ICD? no Possible current pregnancy? no Is the patient on methotrexate? no  Current Complaints/Details: Reports recent left hand surgery.   BP 118/80 (BP Location: Left Arm, Patient Position: Sitting)   Pulse 66   Temp (!) 97.3 F (36.3 C) (Temporal)   Resp 18   Ht 5\' 3"  (1.6 m)   Wt 126 lb 6 oz (57.3 kg)   LMP 02/04/2020   SpO2 100%   BMI 22.39 kg/m

## 2024-02-15 NOTE — Progress Notes (Signed)
 Radiation Oncology         [phone]) [phone] ________________________________  Initial Outpatient Consultation  Name: Nina Horn MRN: [id number]  Date: 02/18/2024  DOB: April 01, 1971  WU:[id number], Suzan Slick, PA  Ladene Artist, MD   REFERRING PHYSICIAN: Ladene Artist, MD  DIAGNOSIS: The encounter diagnosis was MALT lymphoma.  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 female who is seen as a courtesy of Dr. Truett Perna for an opinion concerning radiation therapy as part of management for her recent recurrence of gastric MALT lymphoma.   The patient presented for an EGD in March of 2024 which revealed 2 nonbleeding gastric ulcers in the incisor and gastric antrum, the largest of which measuring 10 mm. Exam findings were also notable for congestion, discoloration, nodularity, ulceration, and inflammation in the gastric antrum, as well as nodularity and congestion in the gastric body and lesser curvature, gastric cardia, and fundus. (Duodenum otherwise appeared normal). Tissue samples from the gastiric antrum and esophagus were collected at that time and showed benign ulcerative gastritis with an atypical lymphoid infiltrate, consistent with a MALT lymphoma; H. pylori negative.   She later underwent a repeat EGD on 04/09/23 which showed areas of nodularity in the gastric body, prepyloric region and gastric antrum (exam findings were otherwise negative for ulceration). Biopsies collected at that time and revealed an atypical lymphoid infiltrate consistent with a lymphoid neoplasm.  A CT AP was promptly performed on 05/25/23 which demonstrated mild wall thickening at the lesser curvature of the distal gastric body, mild gastropathic ligament lymphadenopathy, and soft tissue nodules in the gastrocolic ligament.  Before she could meet with oncology, the patient presented to the [facility] ED on 05/31/23 with c/o intractable  nausea and vomiting for 4 days, intermittent abdominal cramping, and some mild constipation. ED work-up included a CT AP which showed no evidence of bowel obstruction, stability of the distal gastric body wall thickening, and several gastrohepatic lymphadenopathy suggestive of metastatic disease. She was subsequently admitted to [facility] for further management. Hospital course consisted of IV antiemetics as needed, IV zofran, and IV fluids.   She was accordingly seen by Dr. Truett Perna in consultation on 06/04/23 who recommended further staging work-up consisting of a PET scan on  06/11/23 which demonstrated low-level metabolic activity associated with the gastric antrum, a gastrohepatic ligament lymph node, and a peritoneal nodule.   Prior to her PET scan, the patient was again admitted after presented to the ED on 06/06/23 with c/o recurrent intractable nausea/vomiting and ongoing constipation despite using MiraLax. She was evaluated by GI upon admission, Dr. Dulce Sellar who recommended antiemetics and PPI which were administered. The remainder of her hospital course was otherwise similar to her prior admission and she was discharged home on 06/08/23.   After being discharged, the patient followed with Dr. Truett Perna and was soon after started on single agent rituximab on 06/14/23. She completed her 4th and final cycle of rituximab on 07/05/23 and achieved complete resolution of her nausea and vomiting s/p systemic therapy.   For restaging of her disease, a CT AP with contrast was performed on 11/05/23 which demonstrated an interval decrease in size of the gastrohepatic ligament hypermetabolic adenopathy, measuring 1.8 cm in the greatest extent, previously measuring 2.6 cm, consistent with a treatment response. Nonspecific diffuse gastric mucosal thickening was also demonstrated. CT otherwise showed no new abnormalities in the abdomen or pelvis.   In more recent history, the patient presented to  the ED on  01/26/24 with c/o abdominal pain, nausea and vomiting x 2 days. A repeat CT AP was subsequently performed in the ED which demonstrated nonspecific mild wall thickening of the distal gastric body, concerning for residual gastric lymphoma. CT also showed stable vs mildly progressive small peritoneal nodules but otherwise showed stability of the small upper abdominal nodules. She was promptly admitted and underwent an EGD on 01/28/24 which revealed a non-bleeding cratered gastric ulcer of moderate to significant severity in the gastric body and on the greater curvature of the stomach, with significant circumferential erythema and edema. A biopsy of the gastric ulcer was collected and showed atypical lymphoid infiltrate consistent with recurrent MALT lymphoma. She was discharged home in stable condition on 01/30/24 and sent home with BID PPI.   She accordingly followed up with Dr. Truett Perna on 02/07/24 to discuss treatment options. She reported having some stomach discomfort at that time. Given her symptoms, Dr. Truett Perna favors radiation therapy to her recurrent disease over chemotherapy which we will discuss in detail today.   Pertinent imaging thus far includes a a restaging PET scan on 02/13/24. Results are pending at this time.   Patient notes some residual nausea that she uses scopolamine patches for. She states this has been helpful for her. She also notes intermittent constipation. She otherwise denies any vomiting, abdominal pain, diarrhea, or blood in her stool.   PREVIOUS RADIATION THERAPY: No  PAST MEDICAL HISTORY:  Past Medical History:  Diagnosis Date   Cancer (HCC)    Depression    Migraines     PAST SURGICAL HISTORY: Past Surgical History:  Procedure Laterality Date   BIOPSY  01/28/2024   Procedure: BIOPSY;  Surgeon: Willis Modena, MD;  Location: [facility];  Service: Gastroenterology;;   ESOPHAGOGASTRODUODENOSCOPY (EGD) WITH PROPOFOL Left 01/28/2024   Procedure:  ESOPHAGOGASTRODUODENOSCOPY (EGD) WITH PROPOFOL;  Surgeon: Willis Modena, MD;  Location: [facility];  Service: Gastroenterology;  Laterality: Left;   PILONIDAL CYST EXCISION     WISDOM TOOTH EXTRACTION      FAMILY HISTORY:  Family History  Problem Relation Age of Onset   Skeletal dysplasia Daughter        Thanatophoric dysplasia    SOCIAL HISTORY:  Social History   Tobacco Use   Smoking status: Every Day    Current packs/day: 1.00    Types: Cigarettes   Smokeless tobacco: Never  Substance Use Topics   Alcohol use: No   Drug use: Yes    Types: Marijuana  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   ALLERGIES:  Allergies  Allergen Reactions   Promethazine Hcl Other (See Comments)    delirium   Rituximab-Pvvr Nausea And Vomiting    Patient had hypersensitivity reaction to Rituxan. See progress note from 06/14/2023 at 1231. Patient able to complete infusion.    MEDICATIONS:  Current Outpatient Medications  Medication Sig Dispense Refill   acetaminophen (TYLENOL) 500 MG tablet Take 500-1,000 mg by mouth every 6 (six) hours as needed for moderate pain (pain score 4-6).     lidocaine (XYLOCAINE) 2 % solution Use as directed 15 mLs in the mouth or throat 2 (two) times daily.     ondansetron (ZOFRAN) 4 MG tablet Take 4 mg by mouth every 8 (eight) hours as needed for vomiting or nausea.     oxyCODONE (OXY IR/ROXICODONE) 5 MG immediate release tablet Take 1 tablet (5 mg total) by mouth every 6 (six) hours as  needed. 30 tablet 0   pantoprazole (PROTONIX) 40 MG tablet Take 1 tablet (40 mg total) by mouth 2 (two) times daily before a meal. 60 tablet 0   polyethylene glycol (MIRALAX / GLYCOLAX) 17 g packet Take 17 g by mouth daily as needed for moderate constipation or severe constipation. 14 each 0   Scopolamine Base (SCOPOLAMINE TD) Place 1 patch onto the skin every 3 (three) days as needed (N/V).     senna-docusate (SENOKOT-S) 8.6-50 MG  tablet Take 1 tablet by mouth at bedtime as needed for mild constipation. 30 tablet 0   No current facility-administered medications for this encounter.    REVIEW OF SYSTEMS:  Notable for that above.    PHYSICAL EXAM:  height is 5\' 3"  (1.6 m) and weight is 126 lb 6 oz (57.3 kg). Her temporal temperature is 97.3 F (36.3 C) (abnormal). Her blood pressure is 118/80 and her pulse is 66. Her respiration is 18 and oxygen saturation is 100%.   General: Alert and oriented, in no acute distress HEENT: Head is normocephalic. Extraocular movements are intact. Neck: Neck is supple, no palpable cervical or supraclavicular lymphadenopathy. Heart: Regular in rate and rhythm with no murmurs, rubs, or gallops. Chest: Clear to auscultation bilaterally, with no rhonchi, wheezes, or rales. Abdomen: Soft, nontender, nondistended, with no rigidity or guarding. Normoactive bowel sounds. Extremities: No cyanosis or edema. Lymphatics: see Neck Exam Skin: No concerning lesions. Musculoskeletal: symmetric strength and muscle tone throughout. Neurologic: Cranial nerves II through XII are grossly intact. No obvious focalities. Speech is fluent. Coordination is intact. Psychiatric: Judgment and insight are intact. Affect is appropriate.   ECOG = 0  0 - Asymptomatic (Fully active, able to carry on all predisease activities without restriction)  1 - Symptomatic but completely ambulatory (Restricted in physically strenuous activity but ambulatory and able to carry out work of a light or sedentary nature. For example, light housework, office work)  2 - Symptomatic, <50% in bed during the day (Ambulatory and capable of all self care but unable to carry out any work activities. Up and about more than 50% of waking hours)  3 - Symptomatic, >50% in bed, but not bedbound (Capable of only limited self-care, confined to bed or chair 50% or more of waking hours)  4 - Bedbound (Completely disabled. Cannot carry on any  self-care. Totally confined to bed or chair)  5 - Death   Santiago Glad MM, Creech RH, Tormey DC, et al. [phone]). "Toxicity and response criteria of the [facility]". Am. Evlyn Clines. Oncol. 5 (6): 649-55  LABORATORY DATA:  Lab Results  Component Value Date   WBC 8.9 01/30/2024   HGB 11.9 (L) 01/30/2024   HCT 36.5 01/30/2024   MCV 94.8 01/30/2024   PLT 242 01/30/2024   NEUTROABS 6.9 01/28/2024   Lab Results  Component Value Date   NA 137 01/30/2024   K 3.6 01/30/2024   CL 103 01/30/2024   CO2 26 01/30/2024   GLUCOSE 98 01/30/2024   BUN 6 01/30/2024   CREATININE 0.78 01/30/2024   CALCIUM 8.7 (L) 01/30/2024      RADIOGRAPHY: CT ABDOMEN PELVIS W CONTRAST Result Date: 01/26/2024 CLINICAL DATA:  Epigastric pain, MALT lymphoma, status post chemotherapy EXAM: CT ABDOMEN AND PELVIS WITH CONTRAST TECHNIQUE: Multidetector CT imaging of the abdomen and pelvis was performed using the standard protocol following bolus administration of intravenous contrast. RADIATION DOSE REDUCTION: This exam was performed according to the departmental dose-optimization program which includes automated exposure  control, adjustment of the mA and/or kV according to patient size and/or use of iterative reconstruction technique. CONTRAST:  OMNIPAQUE IOHEXOL 300 MG/ML  SOLN COMPARISON:  11/05/2023 FINDINGS: Lower chest: Lung bases are clear. Hepatobiliary: Liver is within normal limits. Gallbladder is unremarkable. No intrahepatic or extrahepatic ductal dilatation. Pancreas: Within normal limits. Spleen: Within normal limits. Adrenals/Urinary Tract: Adrenal glands are within normal limits. Kidneys are within normal limits.  No hydronephrosis. Bladder is within normal limits. Stomach/Bowel: Mild wall thickening involving the distal gastric body (series 2/image 29), nonspecific. However, this corresponds to the site of hypermetabolism on prior PET, raising the possibility of residual gastric lymphoma. No  evidence of bowel obstruction. Appendix is not discretely visualized. No colonic wall thickening or inflammatory changes. Vascular/Lymphatic: No evidence of abdominal aortic aneurysm. Residual gastrohepatic nodes measuring up to 10 mm short axis (series 2/image 18), grossly unchanged and mildly FDG avid on prior PET, suggesting residual lymphoma. Reproductive: Uterus is within normal limits. Right ovary is within normal limits.  No left adnexal mass. Other: No abdominopelvic ascites. Small peritoneal nodules, including an 8 mm nodule beneath the left mid anterior abdominal wall (series 2/image 36), slightly more conspicuous than on the prior. Musculoskeletal: Mild degenerative changes of the lower thoracic spine. IMPRESSION: Mild wall thickening of the distal gastric body, nonspecific but suggesting residual gastric lymphoma. Small upper abdominal nodes, grossly unchanged. Small peritoneal nodules, stable versus mildly progressive. Otherwise, no findings to account for the patient's abdominal pain. Electronically Signed   By: Charline Bills M.D.   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 Pathology reveals recurrent MALT lymphoma. Her symptoms are currently well-controlled with medication. However, given that she continues to experience symptoms, she would likely benefit from radiation treatment. The results of the PET scan are currently pending at this time. Dr. Roselind Messier recommends radiation to the stomach and other possible sites of disease, pending final PET results.  Today, I talked to the patient  about the findings and work-up thus far.  We discussed the natural history of recurrent MALT lymphoma and general treatment, highlighting the role of radiotherapy in the management.  We discussed the available radiation techniques, and focused on the details of logistics and delivery.  We reviewed the anticipated  acute and late sequelae associated with radiation in this setting.  The patient was encouraged to ask questions that I answered to the best of my ability. A patient consent form was discussed and signed.  We retained a copy for our records.  The patient would like to proceed with radiation and will be scheduled for CT simulation.  PLAN:  Patient is scheduled for CT simulation on 02/22/2024. Dr. Roselind Messier anticipates 20 fractions of treatment directed at the visualized sites of disease on PET scan.    60 minutes of total time was spent for this patient encounter, including preparation, face-to-face counseling with the patient and coordination of care, physical exam, and documentation of the encounter.   ------------------------------------------------   Bryan Lemma, PA-C   Billie Lade, PhD, MD   [facility]  Radiation Oncology Direct Dial: [phone]  Fax: [phone] [email or web address]    This document serves as a record of services personally performed by Antony Blackbird, MD. It was created on his behalf by Neena Rhymes, a trained medical scribe. The creation of this record is based on the scribe's personal observations and the provider's statements to them. This  document has been checked and approved by the attending provider.

## 2024-02-18 ENCOUNTER — Ambulatory Visit
Admission: RE | Admit: 2024-02-18 | Discharge: 2024-02-18 | Disposition: A | Discharge status: Home / Self Care | Source: Ambulatory Visit | Attending: Radiation Oncology | Admitting: Radiation Oncology

## 2024-02-18 ENCOUNTER — Encounter: Payer: Self-pay | Admitting: Radiation Oncology

## 2024-02-18 VITALS — BP 118/80 | HR 66 | Temp 97.3°F | Resp 18 | Ht 63.0 in | Wt 126.4 lb

## 2024-02-18 DIAGNOSIS — R112 Nausea with vomiting, unspecified: Secondary | ICD-10-CM | POA: Diagnosis not present

## 2024-02-18 DIAGNOSIS — Z79899 Other long term (current) drug therapy: Secondary | ICD-10-CM | POA: Insufficient documentation

## 2024-02-18 DIAGNOSIS — F1290 Cannabis use, unspecified, uncomplicated: Secondary | ICD-10-CM | POA: Insufficient documentation

## 2024-02-18 DIAGNOSIS — F17210 Nicotine dependence, cigarettes, uncomplicated: Secondary | ICD-10-CM | POA: Insufficient documentation

## 2024-02-18 DIAGNOSIS — C884 Extranodal marginal zone b-cell lymphoma of mucosa-associated lymphoid tissue (malt-lymphoma) not having achieved remission: Secondary | ICD-10-CM

## 2024-02-18 DIAGNOSIS — Z8711 Personal history of peptic ulcer disease: Secondary | ICD-10-CM | POA: Insufficient documentation

## 2024-02-18 DIAGNOSIS — K5900 Constipation, unspecified: Secondary | ICD-10-CM | POA: Diagnosis not present

## 2024-02-18 DIAGNOSIS — C8599 Non-Hodgkin lymphoma, unspecified, extranodal and solid organ sites: Secondary | ICD-10-CM

## 2024-02-18 NOTE — Addendum Note (Signed)
 Encounter addended by: Erven Colla, PA-C on: 02/18/2024 3:00 PM  Actions taken: Order list changed, Diagnosis association updated

## 2024-02-20 ENCOUNTER — Telehealth: Payer: Self-pay | Admitting: Nutrition

## 2024-02-21 NOTE — Progress Notes (Signed)
 Has armband been applied?  {yes no:314532}  Does patient have an allergy to IV contrast dye?: {yes no:314532}   Has patient ever received premedication for IV contrast dye?: {yes no:314532}   Date of lab work: January 30, 2024 BUN: 6 CR: 0.78 eGFR: >60  Does patient take metformin?: {yes no:314532}  Is eGFR >60?: {yes no:314532} If no, when can patient resume? (Must be 48 hrs AFTER they receive IV contrast):  {Time; dates multiple:15870}  IV site: {iv locations:314275}  Has IV site been added to flowsheet?  {yes no:314532}  LMP 02/04/2020

## 2024-02-22 ENCOUNTER — Ambulatory Visit
Admission: RE | Admit: 2024-02-22 | Discharge: 2024-02-22 | Disposition: A | Discharge status: Home / Self Care | Source: Ambulatory Visit | Attending: Radiation Oncology | Admitting: Radiation Oncology

## 2024-02-22 VITALS — BP 116/79 | HR 66 | Temp 97.3°F | Resp 18 | Ht 63.0 in | Wt 125.2 lb

## 2024-02-22 DIAGNOSIS — C884 Extranodal marginal zone b-cell lymphoma of mucosa-associated lymphoid tissue (malt-lymphoma) not having achieved remission: Secondary | ICD-10-CM | POA: Diagnosis present

## 2024-02-22 DIAGNOSIS — Z510 Encounter for antineoplastic radiation therapy: Secondary | ICD-10-CM | POA: Diagnosis present

## 2024-02-29 ENCOUNTER — Inpatient Hospital Stay: Admitting: Dietician

## 2024-02-29 NOTE — Progress Notes (Signed)
Patient did not show for scheduled nutrition appointment.

## 2024-03-02 DIAGNOSIS — Z510 Encounter for antineoplastic radiation therapy: Secondary | ICD-10-CM | POA: Diagnosis not present

## 2024-03-03 ENCOUNTER — Ambulatory Visit
Admission: RE | Admit: 2024-03-03 | Discharge: 2024-03-03 | Disposition: A | Discharge status: Home / Self Care | Source: Ambulatory Visit | Attending: Radiation Oncology | Admitting: Radiation Oncology

## 2024-03-03 ENCOUNTER — Other Ambulatory Visit: Payer: Self-pay

## 2024-03-03 DIAGNOSIS — C8599 Non-Hodgkin lymphoma, unspecified, extranodal and solid organ sites: Secondary | ICD-10-CM

## 2024-03-03 DIAGNOSIS — Z510 Encounter for antineoplastic radiation therapy: Secondary | ICD-10-CM | POA: Diagnosis not present

## 2024-03-03 LAB — RAD ONC ARIA SESSION SUMMARY
Course Elapsed Days: 0
Plan Fractions Treated to Date: 1
Plan Prescribed Dose Per Fraction: 1.7 Gy
Plan Total Fractions Prescribed: 18
Plan Total Prescribed Dose: 30.6 Gy
Reference Point Dosage Given to Date: 1.7 Gy
Reference Point Session Dosage Given: 1.7 Gy
Session Number: 1

## 2024-03-04 ENCOUNTER — Ambulatory Visit
Admission: RE | Admit: 2024-03-04 | Discharge: 2024-03-04 | Disposition: A | Discharge status: Home / Self Care | Source: Ambulatory Visit | Attending: Radiation Oncology | Admitting: Radiation Oncology

## 2024-03-04 ENCOUNTER — Other Ambulatory Visit: Payer: Self-pay | Admitting: Radiation Oncology

## 2024-03-04 ENCOUNTER — Other Ambulatory Visit: Payer: Self-pay

## 2024-03-04 DIAGNOSIS — C8599 Non-Hodgkin lymphoma, unspecified, extranodal and solid organ sites: Secondary | ICD-10-CM | POA: Diagnosis present

## 2024-03-04 DIAGNOSIS — C884 Extranodal marginal zone b-cell lymphoma of mucosa-associated lymphoid tissue (malt-lymphoma) not having achieved remission: Secondary | ICD-10-CM | POA: Insufficient documentation

## 2024-03-04 DIAGNOSIS — R112 Nausea with vomiting, unspecified: Secondary | ICD-10-CM | POA: Insufficient documentation

## 2024-03-04 LAB — RAD ONC ARIA SESSION SUMMARY
Course Elapsed Days: 1
Plan Fractions Treated to Date: 2
Plan Prescribed Dose Per Fraction: 1.7 Gy
Plan Total Fractions Prescribed: 18
Plan Total Prescribed Dose: 30.6 Gy
Reference Point Dosage Given to Date: 3.4 Gy
Reference Point Session Dosage Given: 1.7 Gy
Session Number: 2

## 2024-03-04 MED ORDER — ONDANSETRON 4 MG PO TBDP: 4.0000 mg | ORAL_TABLET | Freq: Three times a day (TID) | ORAL | 1 refills | Status: DC | PRN

## 2024-03-04 MED ORDER — SONAFINE EX EMUL
1.0000 | Freq: Two times a day (BID) | CUTANEOUS | Status: DC
  Administered 2024-03-04: 1 via TOPICAL

## 2024-03-05 ENCOUNTER — Ambulatory Visit
Admission: RE | Admit: 2024-03-05 | Discharge: 2024-03-05 | Disposition: A | Discharge status: Home / Self Care | Source: Ambulatory Visit | Attending: Radiation Oncology | Admitting: Radiation Oncology

## 2024-03-05 ENCOUNTER — Other Ambulatory Visit: Payer: Self-pay

## 2024-03-05 DIAGNOSIS — C884 Extranodal marginal zone b-cell lymphoma of mucosa-associated lymphoid tissue (malt-lymphoma) not having achieved remission: Secondary | ICD-10-CM | POA: Diagnosis not present

## 2024-03-05 LAB — RAD ONC ARIA SESSION SUMMARY
Course Elapsed Days: 2
Plan Fractions Treated to Date: 3
Plan Prescribed Dose Per Fraction: 1.7 Gy
Plan Total Fractions Prescribed: 18
Plan Total Prescribed Dose: 30.6 Gy
Reference Point Dosage Given to Date: 5.1 Gy
Reference Point Session Dosage Given: 1.7 Gy
Session Number: 3

## 2024-03-06 ENCOUNTER — Other Ambulatory Visit: Payer: Self-pay

## 2024-03-06 ENCOUNTER — Ambulatory Visit
Admission: RE | Admit: 2024-03-06 | Discharge: 2024-03-06 | Disposition: A | Discharge status: Home / Self Care | Source: Ambulatory Visit | Attending: Radiation Oncology | Admitting: Radiation Oncology

## 2024-03-06 ENCOUNTER — Telehealth: Payer: Self-pay

## 2024-03-06 DIAGNOSIS — C8599 Non-Hodgkin lymphoma, unspecified, extranodal and solid organ sites: Secondary | ICD-10-CM

## 2024-03-06 DIAGNOSIS — C884 Extranodal marginal zone b-cell lymphoma of mucosa-associated lymphoid tissue (malt-lymphoma) not having achieved remission: Secondary | ICD-10-CM | POA: Diagnosis not present

## 2024-03-06 LAB — RAD ONC ARIA SESSION SUMMARY
Course Elapsed Days: 3
Plan Fractions Treated to Date: 4
Plan Prescribed Dose Per Fraction: 1.7 Gy
Plan Total Fractions Prescribed: 18
Plan Total Prescribed Dose: 30.6 Gy
Reference Point Dosage Given to Date: 6.8 Gy
Reference Point Session Dosage Given: 1.7 Gy
Session Number: 4

## 2024-03-06 MED ORDER — PANTOPRAZOLE SODIUM 40 MG PO TBEC: 40.0000 mg | DELAYED_RELEASE_TABLET | Freq: Two times a day (BID) | ORAL | 1 refills | Status: DC

## 2024-03-06 NOTE — Telephone Encounter (Signed)
 The patient called to request a refill of her Protonix medication. Per [facility], it is approved to proceed with the refill.

## 2024-03-07 ENCOUNTER — Other Ambulatory Visit: Payer: Self-pay

## 2024-03-07 ENCOUNTER — Encounter: Payer: Self-pay | Admitting: *Deleted

## 2024-03-07 ENCOUNTER — Ambulatory Visit
Admission: RE | Admit: 2024-03-07 | Discharge: 2024-03-07 | Disposition: A | Discharge status: Home / Self Care | Source: Ambulatory Visit | Attending: Radiation Oncology | Admitting: Radiation Oncology

## 2024-03-07 DIAGNOSIS — C884 Extranodal marginal zone b-cell lymphoma of mucosa-associated lymphoid tissue (malt-lymphoma) not having achieved remission: Secondary | ICD-10-CM | POA: Diagnosis not present

## 2024-03-07 LAB — RAD ONC ARIA SESSION SUMMARY
Course Elapsed Days: 4
Plan Fractions Treated to Date: 5
Plan Prescribed Dose Per Fraction: 1.7 Gy
Plan Total Fractions Prescribed: 18
Plan Total Prescribed Dose: 30.6 Gy
Reference Point Dosage Given to Date: 8.5 Gy
Reference Point Session Dosage Given: 1.7 Gy
Session Number: 5

## 2024-03-10 ENCOUNTER — Ambulatory Visit
Admission: RE | Admit: 2024-03-10 | Discharge: 2024-03-10 | Disposition: A | Discharge status: Home / Self Care | Source: Ambulatory Visit | Attending: Radiation Oncology | Admitting: Radiation Oncology

## 2024-03-10 ENCOUNTER — Other Ambulatory Visit: Payer: Self-pay

## 2024-03-10 DIAGNOSIS — C884 Extranodal marginal zone b-cell lymphoma of mucosa-associated lymphoid tissue (malt-lymphoma) not having achieved remission: Secondary | ICD-10-CM | POA: Diagnosis not present

## 2024-03-10 LAB — RAD ONC ARIA SESSION SUMMARY
Course Elapsed Days: 7
Plan Fractions Treated to Date: 6
Plan Prescribed Dose Per Fraction: 1.7 Gy
Plan Total Fractions Prescribed: 18
Plan Total Prescribed Dose: 30.6 Gy
Reference Point Dosage Given to Date: 10.2 Gy
Reference Point Session Dosage Given: 1.7 Gy
Session Number: 6

## 2024-03-11 ENCOUNTER — Ambulatory Visit
Admission: RE | Admit: 2024-03-11 | Discharge: 2024-03-11 | Discharge status: Home / Self Care | Source: Ambulatory Visit | Attending: Radiation Oncology | Admitting: Radiation Oncology

## 2024-03-11 ENCOUNTER — Other Ambulatory Visit: Payer: Self-pay

## 2024-03-11 ENCOUNTER — Ambulatory Visit
Admission: RE | Admit: 2024-03-11 | Discharge: 2024-03-11 | Disposition: A | Discharge status: Home / Self Care | Source: Ambulatory Visit | Attending: Radiation Oncology | Admitting: Radiation Oncology

## 2024-03-11 DIAGNOSIS — C884 Extranodal marginal zone b-cell lymphoma of mucosa-associated lymphoid tissue (malt-lymphoma) not having achieved remission: Secondary | ICD-10-CM | POA: Diagnosis not present

## 2024-03-11 LAB — RAD ONC ARIA SESSION SUMMARY
Course Elapsed Days: 8
Plan Fractions Treated to Date: 7
Plan Prescribed Dose Per Fraction: 1.7 Gy
Plan Total Fractions Prescribed: 18
Plan Total Prescribed Dose: 30.6 Gy
Reference Point Dosage Given to Date: 11.9 Gy
Reference Point Session Dosage Given: 1.7 Gy
Session Number: 7

## 2024-03-12 ENCOUNTER — Other Ambulatory Visit: Payer: Self-pay

## 2024-03-12 ENCOUNTER — Ambulatory Visit
Admission: RE | Admit: 2024-03-12 | Discharge: 2024-03-12 | Disposition: A | Discharge status: Home / Self Care | Source: Ambulatory Visit | Attending: Radiation Oncology | Admitting: Radiation Oncology

## 2024-03-12 DIAGNOSIS — C884 Extranodal marginal zone b-cell lymphoma of mucosa-associated lymphoid tissue (malt-lymphoma) not having achieved remission: Secondary | ICD-10-CM | POA: Diagnosis not present

## 2024-03-12 LAB — RAD ONC ARIA SESSION SUMMARY
Course Elapsed Days: 9
Plan Fractions Treated to Date: 8
Plan Prescribed Dose Per Fraction: 1.7 Gy
Plan Total Fractions Prescribed: 18
Plan Total Prescribed Dose: 30.6 Gy
Reference Point Dosage Given to Date: 13.6 Gy
Reference Point Session Dosage Given: 1.7 Gy
Session Number: 8

## 2024-03-13 ENCOUNTER — Other Ambulatory Visit: Payer: Self-pay | Admitting: Radiation Oncology

## 2024-03-13 ENCOUNTER — Ambulatory Visit

## 2024-03-13 ENCOUNTER — Ambulatory Visit
Admission: RE | Admit: 2024-03-13 | Discharge: 2024-03-13 | Disposition: A | Discharge status: Home / Self Care | Source: Ambulatory Visit | Attending: Radiation Oncology | Admitting: Radiation Oncology

## 2024-03-13 ENCOUNTER — Other Ambulatory Visit: Payer: Self-pay

## 2024-03-13 DIAGNOSIS — C884 Extranodal marginal zone b-cell lymphoma of mucosa-associated lymphoid tissue (malt-lymphoma) not having achieved remission: Secondary | ICD-10-CM | POA: Diagnosis not present

## 2024-03-13 LAB — RAD ONC ARIA SESSION SUMMARY
Course Elapsed Days: 10
Plan Fractions Treated to Date: 9
Plan Prescribed Dose Per Fraction: 1.7 Gy
Plan Total Fractions Prescribed: 18
Plan Total Prescribed Dose: 30.6 Gy
Reference Point Dosage Given to Date: 15.3 Gy
Reference Point Session Dosage Given: 1.7 Gy
Session Number: 9

## 2024-03-13 MED ORDER — LIDOCAINE VISCOUS HCL 2 % MT SOLN: 15.0000 mL | OROMUCOSAL | 0 refills | Status: DC | PRN

## 2024-03-14 ENCOUNTER — Other Ambulatory Visit: Payer: Self-pay

## 2024-03-14 ENCOUNTER — Ambulatory Visit

## 2024-03-14 ENCOUNTER — Ambulatory Visit
Admission: RE | Admit: 2024-03-14 | Discharge: 2024-03-14 | Disposition: A | Discharge status: Home / Self Care | Source: Ambulatory Visit | Attending: Radiation Oncology | Admitting: Radiation Oncology

## 2024-03-14 DIAGNOSIS — C884 Extranodal marginal zone b-cell lymphoma of mucosa-associated lymphoid tissue (malt-lymphoma) not having achieved remission: Secondary | ICD-10-CM | POA: Diagnosis not present

## 2024-03-14 LAB — RAD ONC ARIA SESSION SUMMARY
Course Elapsed Days: 11
Plan Fractions Treated to Date: 10
Plan Prescribed Dose Per Fraction: 1.7 Gy
Plan Total Fractions Prescribed: 18
Plan Total Prescribed Dose: 30.6 Gy
Reference Point Dosage Given to Date: 17 Gy
Reference Point Session Dosage Given: 1.7 Gy
Session Number: 10

## 2024-03-17 ENCOUNTER — Other Ambulatory Visit: Payer: Self-pay

## 2024-03-17 ENCOUNTER — Ambulatory Visit
Admission: RE | Admit: 2024-03-17 | Discharge: 2024-03-17 | Disposition: A | Discharge status: Home / Self Care | Source: Ambulatory Visit | Attending: Radiation Oncology | Admitting: Radiation Oncology

## 2024-03-17 DIAGNOSIS — C884 Extranodal marginal zone b-cell lymphoma of mucosa-associated lymphoid tissue (malt-lymphoma) not having achieved remission: Secondary | ICD-10-CM | POA: Diagnosis not present

## 2024-03-17 LAB — RAD ONC ARIA SESSION SUMMARY
Course Elapsed Days: 14
Plan Fractions Treated to Date: 11
Plan Prescribed Dose Per Fraction: 1.7 Gy
Plan Total Fractions Prescribed: 18
Plan Total Prescribed Dose: 30.6 Gy
Reference Point Dosage Given to Date: 18.7 Gy
Reference Point Session Dosage Given: 1.7 Gy
Session Number: 11

## 2024-03-18 ENCOUNTER — Ambulatory Visit
Admission: RE | Admit: 2024-03-18 | Discharge: 2024-03-18 | Disposition: A | Discharge status: Home / Self Care | Source: Ambulatory Visit | Attending: Radiation Oncology | Admitting: Radiation Oncology

## 2024-03-18 ENCOUNTER — Other Ambulatory Visit: Payer: Self-pay

## 2024-03-18 ENCOUNTER — Ambulatory Visit

## 2024-03-18 DIAGNOSIS — C884 Extranodal marginal zone b-cell lymphoma of mucosa-associated lymphoid tissue (malt-lymphoma) not having achieved remission: Secondary | ICD-10-CM

## 2024-03-18 LAB — RAD ONC ARIA SESSION SUMMARY
Course Elapsed Days: 15
Plan Fractions Treated to Date: 12
Plan Prescribed Dose Per Fraction: 1.7 Gy
Plan Total Fractions Prescribed: 18
Plan Total Prescribed Dose: 30.6 Gy
Reference Point Dosage Given to Date: 20.4 Gy
Reference Point Session Dosage Given: 1.7 Gy
Session Number: 12

## 2024-03-19 ENCOUNTER — Telehealth: Payer: Self-pay | Admitting: Dietician

## 2024-03-19 ENCOUNTER — Ambulatory Visit
Admission: RE | Admit: 2024-03-19 | Discharge: 2024-03-19 | Disposition: A | Discharge status: Home / Self Care | Source: Ambulatory Visit | Attending: Radiation Oncology | Admitting: Radiation Oncology

## 2024-03-19 ENCOUNTER — Other Ambulatory Visit: Payer: Self-pay

## 2024-03-19 DIAGNOSIS — C884 Extranodal marginal zone b-cell lymphoma of mucosa-associated lymphoid tissue (malt-lymphoma) not having achieved remission: Secondary | ICD-10-CM | POA: Diagnosis not present

## 2024-03-19 LAB — RAD ONC ARIA SESSION SUMMARY
Course Elapsed Days: 16
Plan Fractions Treated to Date: 13
Plan Prescribed Dose Per Fraction: 1.7 Gy
Plan Total Fractions Prescribed: 18
Plan Total Prescribed Dose: 30.6 Gy
Reference Point Dosage Given to Date: 22.1 Gy
Reference Point Session Dosage Given: 1.7 Gy
Session Number: 13

## 2024-03-19 NOTE — Telephone Encounter (Signed)
 Patient scheduled appointments. Patient is aware of all appointment details.

## 2024-03-20 ENCOUNTER — Telehealth: Payer: Self-pay | Admitting: *Deleted

## 2024-03-20 ENCOUNTER — Inpatient Hospital Stay: Admitting: Licensed Clinical Social Worker

## 2024-03-20 ENCOUNTER — Emergency Department (HOSPITAL_BASED_OUTPATIENT_CLINIC_OR_DEPARTMENT_OTHER)
Admission: EM | Admit: 2024-03-20 | Discharge: 2024-03-21 | Disposition: A | Discharge status: Home / Self Care | Attending: Emergency Medicine | Admitting: Emergency Medicine

## 2024-03-20 ENCOUNTER — Emergency Department (HOSPITAL_BASED_OUTPATIENT_CLINIC_OR_DEPARTMENT_OTHER)

## 2024-03-20 ENCOUNTER — Other Ambulatory Visit: Payer: Self-pay

## 2024-03-20 ENCOUNTER — Ambulatory Visit

## 2024-03-20 DIAGNOSIS — C884 Extranodal marginal zone b-cell lymphoma of mucosa-associated lymphoid tissue (malt-lymphoma) not having achieved remission: Secondary | ICD-10-CM | POA: Insufficient documentation

## 2024-03-20 DIAGNOSIS — E440 Moderate protein-calorie malnutrition: Secondary | ICD-10-CM | POA: Insufficient documentation

## 2024-03-20 DIAGNOSIS — R112 Nausea with vomiting, unspecified: Secondary | ICD-10-CM | POA: Diagnosis present

## 2024-03-20 LAB — COMPREHENSIVE METABOLIC PANEL WITH GFR
ALT: 8 U/L (ref 0–44)
AST: 17 U/L (ref 15–41)
Albumin: 4.9 g/dL (ref 3.5–5.0)
Alkaline Phosphatase: 68 U/L (ref 38–126)
Anion gap: 10 (ref 5–15)
BUN: 12 mg/dL (ref 6–20)
CO2: 29 mmol/L (ref 22–32)
Calcium: 10.1 mg/dL (ref 8.9–10.3)
Chloride: 100 mmol/L (ref 98–111)
Creatinine, Ser: 0.82 mg/dL (ref 0.44–1.00)
GFR, Estimated: >60 mL/min (ref >60)
Glucose, Bld: 88 mg/dL (ref 70–99)
Potassium: 3.9 mmol/L (ref 3.5–5.1)
Sodium: 139 mmol/L (ref 135–145)
Total Bilirubin: 0.4 mg/dL (ref 0.0–1.2)
Total Protein: 7.7 g/dL (ref 6.5–8.1)

## 2024-03-20 LAB — CBC WITH DIFFERENTIAL/PLATELET
Abs Immature Granulocytes: 0.02 10*3/uL (ref 0.00–0.07)
Basophils Absolute: 0 10*3/uL (ref 0.0–0.1)
Basophils Relative: 0 %
Eosinophils Absolute: 0.1 10*3/uL (ref 0.0–0.5)
Eosinophils Relative: 1 %
HCT: 42 % (ref 36.0–46.0)
Hemoglobin: 14.2 g/dL (ref 12.0–15.0)
Immature Granulocytes: 0 %
Lymphocytes Relative: 11 %
Lymphs Abs: 0.6 10*3/uL — ABNORMAL LOW (ref 0.7–4.0)
MCH: 30.6 pg (ref 26.0–34.0)
MCHC: 33.8 g/dL (ref 30.0–36.0)
MCV: 90.5 fL (ref 80.0–100.0)
Monocytes Absolute: 0.6 10*3/uL (ref 0.1–1.0)
Monocytes Relative: 10 %
Neutro Abs: 4.4 10*3/uL (ref 1.7–7.7)
Neutrophils Relative %: 78 %
Platelets: 202 10*3/uL (ref 150–400)
RBC: 4.64 MIL/uL (ref 3.87–5.11)
RDW: 12.9 % (ref 11.5–15.5)
WBC: 5.7 10*3/uL (ref 4.0–10.5)
nRBC: 0 % (ref 0.0–0.2)

## 2024-03-20 LAB — LIPASE, BLOOD: Lipase: 24 U/L (ref 11–51)

## 2024-03-20 LAB — TSH: TSH: 0.544 u[IU]/mL (ref 0.350–4.500)

## 2024-03-20 LAB — TROPONIN I (HIGH SENSITIVITY)
Troponin I (High Sensitivity): 5 ng/L (ref <18)
Troponin I (High Sensitivity): 6 ng/L (ref <18)

## 2024-03-20 MED ORDER — SODIUM CHLORIDE 0.9 % IV BOLUS
1000.0000 mL | Freq: Once | INTRAVENOUS | Status: AC
  Administered 2024-03-20: 1000 mL via INTRAVENOUS

## 2024-03-20 MED ORDER — ONDANSETRON 8 MG PO TBDP: 8.0000 mg | ORAL_TABLET | Freq: Three times a day (TID) | ORAL | 1 refills | Status: DC | PRN

## 2024-03-20 MED ORDER — METOCLOPRAMIDE HCL 5 MG/ML IJ SOLN
10.0000 mg | Freq: Once | INTRAMUSCULAR | Status: AC
  Administered 2024-03-20: 10 mg via INTRAVENOUS
  Filled 2024-03-20: qty 2

## 2024-03-20 MED ORDER — SODIUM CHLORIDE 0.9 % IV SOLN: 12.5000 mg | Freq: Once | INTRAVENOUS | Status: DC

## 2024-03-20 MED ORDER — SODIUM CHLORIDE 0.9 % IV SOLN: INTRAVENOUS | Status: DC

## 2024-03-20 MED ORDER — ONDANSETRON HCL 4 MG/2ML IJ SOLN
4.0000 mg | Freq: Once | INTRAMUSCULAR | Status: AC
  Administered 2024-03-20: 4 mg via INTRAVENOUS
  Filled 2024-03-20: qty 2

## 2024-03-20 MED ORDER — IOHEXOL 300 MG/ML  SOLN
75.0000 mL | Freq: Once | INTRAMUSCULAR | Status: AC | PRN
  Administered 2024-03-20: 75 mL via INTRAVENOUS

## 2024-03-20 NOTE — Telephone Encounter (Signed)
 Called to report extreme fatigue w/shortness of breath w/exertion from the radiation therapy as well as nausea. Her PCP wrote her out of work x 2 weeks (from 4/16) and she is asking if she needs more time, can it be extended. Having nausea not resolved w/ondansetron 4 mg and the scopolamine patch. Instructed her to increase ondansetron to 8 mg. Still having trouble swallowing and pain. Feels like a "huge lump" is stuck in throat according to [facility]. Taking the pantoprazole bid and trying to use the Lidocaine prescribed, but this makes her nauseated as well. Asking if Dr. Scherrie Curt has any thoughts to help her feel better?

## 2024-03-20 NOTE — ED Provider Notes (Addendum)
 [place] EMERGENCY DEPARTMENT AT [facility] Provider Note   CSN: [id number] Arrival date & time: 03/20/24  1716     History  Chief Complaint  Patient presents with   Emesis   Nausea    Nina Horn is a 53 y.o. female.  Patient here with concerns for a lump in her throat persistent vomiting.  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  Patient followed by Dr. Scherrie Curt.  Patient also seen in the emergency department February 22 with an admission to February 26.  Patient underwent EGD by Dr. Kimble Pennant.  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  This was all originally diagnosed in March 2024.  When she had a nonbleeding ulcer.  Originally that revealed benign ulcerative gastritis with an atypical lymphoid infiltrate H. pylori negative consistent with a MALT lymphoma.  Patient underwent EGD in 2024 and biopsy revealed again atypical lymphoid infiltrate.  Had PET scan started in July.  Patient also had 4 cycles of immunotherapy completing that in August 2024.  Patient's had difficulty with nausea and vomiting and abdominal pain secondary to #1.  When seen in March she was doing a little bit better at that time.  They decided to go with radiation treatment since she was symptomatic in the stomach.  Her radiation oncologist is Dr. Mardelle Shady.  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  Treatment plan is for non-Hodgkin lymphoma that was why they did the immunotherapy initially.  Patient here today because she has had difficulties with persistent vomiting.  Has tightness in her upper chest and has a lump feeling in her throat and feels that her voice is getting hoarse.  She is very concerned that it is secondary to the radiation but she has been told by her oncologist and her radiation oncologist that that is unlikely.  But she is convinced that they are related  because it started 2 days into the radiation treatment.  Patient was post to go to radiation treatment today.  Was not feeling well so she did not go.       Home Medications Prior to Admission medications   Medication Sig Start Date End Date Taking? Authorizing Provider  acetaminophen (TYLENOL) 500 MG tablet Take 500-1,000 mg by mouth every 6 (six) hours as needed for moderate pain (pain score 4-6).    [provider]  lidocaine (XYLOCAINE) 2 % solution Use as directed 15 mLs in the mouth or throat 2 (two) times daily. 01/30/24   [provider]  lidocaine (XYLOCAINE) 2 % solution Use as directed 15 mLs in the mouth or throat as needed. 20 minutes Prior to meals 03/13/24   [name], MD  ondansetron (ZOFRAN) 4 MG tablet Take 4 mg by mouth every 8 (eight) hours as needed for vomiting or nausea. 01/24/23   [provider]  ondansetron (ZOFRAN-ODT) 8 MG disintegrating tablet Take 1 tablet (8 mg total) by mouth every 8 (eight) hours as needed for nausea or vomiting. 03/20/24   Sumner Ends, MD  oxyCODONE (OXY IR/ROXICODONE) 5 MG immediate release tablet Take 1 tablet (5 mg total) by mouth every 6 (six) hours as needed. 01/30/24   [name], [name], MD  pantoprazole (PROTONIX) 40 MG tablet Take 1 tablet (40 mg total) by mouth 2 (two) times daily before a meal. 03/06/24 05/05/24  Sumner Ends, MD  polyethylene glycol (MIRALAX / GLYCOLAX) 17 g packet  Take 17 g by mouth daily as needed for moderate constipation or severe constipation. 01/30/24   [name], [name], MD  Scopolamine Base (SCOPOLAMINE TD) Place 1 patch onto the skin every 3 (three) days as needed (N/V).    [provider]  senna-docusate (SENOKOT-S) 8.6-50 MG tablet Take 1 tablet by mouth at bedtime as needed for mild constipation. 01/30/24   [name], [name], MD  ranitidine (ZANTAC) 150 MG tablet Take 1 tablet (150 mg total) by mouth 2 (two) times daily. Patient not taking: Reported on 10/13/2018 07/27/18 04/05/20   Emmet Harm, MD      Allergies    Promethazine hcl and Rituximab-pvvr    Review of Systems   Review of Systems  Constitutional:  Negative for chills and fever.  HENT:  Positive for trouble swallowing and voice change. Negative for ear pain and sore throat.   Eyes:  Negative for pain and visual disturbance.  Respiratory:  Negative for cough and shortness of breath.   Cardiovascular:  Positive for chest pain. Negative for palpitations.  Gastrointestinal:  Positive for nausea and vomiting. Negative for abdominal pain.  Genitourinary:  Negative for dysuria and hematuria.  Musculoskeletal:  Negative for arthralgias and back pain.  Skin:  Negative for color change and rash.  Neurological:  Negative for seizures and syncope.  All other systems reviewed and are negative.   Physical Exam Updated Vital Signs BP 127/81   Pulse (!) 58   Temp 98.5 F (36.9 C) (Oral)   Resp 18   LMP 02/04/2020   SpO2 100%  Physical Exam Vitals and nursing note reviewed.  Constitutional:      General: She is not in acute distress.    Appearance: Normal appearance. She is well-developed.  HENT:     Head: Normocephalic and atraumatic.     Mouth/Throat:     Mouth: Mucous membranes are dry.  Eyes:     Extraocular Movements: Extraocular movements intact.     Conjunctiva/sclera: Conjunctivae normal.     Pupils: Pupils are equal, round, and reactive to light.  Cardiovascular:     Rate and Rhythm: Normal rate and regular rhythm.     Heart sounds: No murmur heard. Pulmonary:     Effort: Pulmonary effort is normal. No respiratory distress.     Breath sounds: Normal breath sounds.  Abdominal:     Palpations: Abdomen is soft.     Tenderness: There is no abdominal tenderness.  Musculoskeletal:        General: No swelling.     Cervical back: Normal range of motion and neck supple.  Skin:    General: Skin is warm and dry.     Capillary Refill: Capillary refill takes less than 2 seconds.  Neurological:      General: No focal deficit present.     Mental Status: She is alert and oriented to person, place, and time.  Psychiatric:        Mood and Affect: Mood normal.     ED Results / Procedures / Treatments   Labs (all labs ordered are listed, but only abnormal results are displayed) Labs Reviewed  CBC WITH DIFFERENTIAL/PLATELET - Abnormal; Notable for the following components:      Result Value   Lymphs Abs 0.6 (*)    All other components within normal limits  LIPASE, BLOOD  COMPREHENSIVE METABOLIC PANEL WITH GFR  URINALYSIS, ROUTINE W REFLEX MICROSCOPIC  TSH  TROPONIN I (HIGH SENSITIVITY)    EKG None  Radiology No  results found.  Procedures Procedures    Medications Ordered in ED Medications  sodium chloride 0.9 % bolus 1,000 mL (has no administration in time range)  0.9 %  sodium chloride infusion (has no administration in time range)  ondansetron (ZOFRAN) injection 4 mg (has no administration in time range)    ED Course/ Medical Decision Making/ A&P                                 Medical Decision Making Amount and/or Complexity of Data Reviewed Labs: ordered. Radiology: ordered.  Risk Prescription drug management.   Patient CBC white count 5.7 hemoglobin 14.2 platelets 202 differential without any significant abnormalities.  And an absolute lymphocyte count of 0.6.  Complete metabolic panel lipase pending.  Will give IV fluids in form of a bolus and maintenance fluids.  And will give IV Zofran because patient feels that that works better.  She does have ODT Zofran at home.  Will get troponin will get chest x-ray also considering getting soft tissue neck to make sure there is not something else going on.  Delta troponins are very reassuring first 1 was 5 and repeat was 6.  Also thyroid-stimulating hormone was normal.  Labs normal.  Patient vomiting was improved some with the Reglan.  But she did not like the way it made her feel.  So she does not want  that as a prescription.  Her regular doctor did increase her Zofran to 8 mg ODT today.  Patient feels that everything secondary to radiation treatment.  Would recommend she discuss this with her radiation oncologist.  If CT soft tissue neck does not have any acute findings patient certainly stable for discharge.   Final Clinical Impression(s) / ED Diagnoses Final diagnoses:  MALT lymphoma  Nausea and vomiting, unspecified vomiting type    Rx / DC Orders ED Discharge Orders     None         Nicklas Barns, MD 03/20/24 1954    Nicklas Barns, MD 03/20/24 Gerry Krone    Nicklas Barns, MD 03/20/24 2317

## 2024-03-20 NOTE — Progress Notes (Signed)
 [facility] CSW Progress Note  Copywriter, advertising received a referral to contact pt regarding financial resources.  Pt reports she is employed full time, but does not have benefits and has been unable to work since starting radiation.  Pt is a single mother w/ 2 sons ages 26 and 79 at home with her.  Per pt she was receiving Section 8 housing; however, 6 months ago this was stopped as the apartment was not up to the standards to qualify for section 8.  The landlord has corrected the issues and paperwork has been filed to reinstate the section 8 status, but pt has needed to pay $850 per month instead of $340 which has put her 2 months behind in rent.  Pt provided w/ the telephone number for the Leuk[employer] to apply for their emergency grants.  Referral sent to Cancer Services for additional financial support.  Pt provided w/ contact information for primary CSW to follow up w/ upon return to the office.      Quenton Bruns, LCSW Clinical Social Worker [facility]    Patient is participating in a Managed Medicaid Plan:  Yes

## 2024-03-20 NOTE — Discharge Instructions (Addendum)
 Workup here without any acute findings.  Recommend that you follow back with radiation oncology and your oncology doctor.  Can also follow-up with your primary care doctor.  Take the stronger Zofran ODT the 8 mg.  Return for any new or worse symptoms.

## 2024-03-20 NOTE — ED Notes (Signed)
 Spoke w/ lab about troponin add-on

## 2024-03-20 NOTE — ED Triage Notes (Signed)
 Pt POV reporting n/v since yesterday, Malt lymphoma, +radiation therapy. Unable to keep nausea meds down, called oncologist and advised to come to ED.

## 2024-03-21 ENCOUNTER — Encounter: Payer: Self-pay | Admitting: Oncology

## 2024-03-21 ENCOUNTER — Ambulatory Visit
Admission: RE | Admit: 2024-03-21 | Discharge: 2024-03-21 | Disposition: A | Discharge status: Home / Self Care | Source: Ambulatory Visit | Attending: Radiation Oncology | Admitting: Radiation Oncology

## 2024-03-21 ENCOUNTER — Other Ambulatory Visit: Payer: Self-pay

## 2024-03-21 ENCOUNTER — Inpatient Hospital Stay: Admitting: Dietician

## 2024-03-21 ENCOUNTER — Encounter: Payer: Self-pay | Admitting: *Deleted

## 2024-03-21 DIAGNOSIS — C884 Extranodal marginal zone b-cell lymphoma of mucosa-associated lymphoid tissue (malt-lymphoma) not having achieved remission: Secondary | ICD-10-CM | POA: Diagnosis not present

## 2024-03-21 LAB — RAD ONC ARIA SESSION SUMMARY
Course Elapsed Days: 18
Plan Fractions Treated to Date: 14
Plan Prescribed Dose Per Fraction: 1.7 Gy
Plan Total Fractions Prescribed: 18
Plan Total Prescribed Dose: 30.6 Gy
Reference Point Dosage Given to Date: 23.8 Gy
Reference Point Session Dosage Given: 1.7 Gy
Session Number: 14

## 2024-03-21 MED ORDER — ONDANSETRON HCL 4 MG/2ML IJ SOLN
4.0000 mg | Freq: Once | INTRAMUSCULAR | Status: AC
  Administered 2024-03-21: 4 mg via INTRAVENOUS
  Filled 2024-03-21: qty 2

## 2024-03-21 NOTE — ED Notes (Signed)
 Pt requesting to go home, continues to c/o unnecessary tests that have been ordered and how long it is taking to get CT results read EDP made aware

## 2024-03-21 NOTE — ED Provider Notes (Signed)
 Patient signed out to me by Dr. Zackowski.  Patient awaiting soft tissue neck CT because of globus sensation with associated nausea and vomiting felt to be secondary to radiation treatment.  Patient asked me to come to her room, CT has not been read yet but she would like to go home.  Patient asking for an additional dose of Zofran .  She has an appointment in the morning with radiation oncology, can follow-up CT findings at that time.  She is in no distress.   Haze Lonni PARAS, MD 03/21/24 [phone]

## 2024-03-21 NOTE — Progress Notes (Signed)
 Nutrition Assessment   Reason for Assessment: Referral   ASSESSMENT: 53 year old female with gastric MALT lymphoma. She is currently receiving radiation therapy. Patient is under the care of Dr. Shannon and Dr. Cloretta  Past medical history includes moderate malnutrition  Met with patient in office. She is fatigued and nauseas upon arrival. Patient seen in ED yesterday secondary to nausea with vomiting. Her stomach feels sour all the time. Patient often vomits after bites of food. Recalls episode of vomiting after couple bites of corn beef hash and stewed apples this morning. Patient is unsure of what to eat. She is drinking Ensure Clear. Unable to tolerate milky supplements. Patient does like milk. She reports occasional constipation managed with miralax .   Nutrition Focused Physical Exam:   Orbital Region: mild Buccal Region: moderate Upper Arm Region: moderate Temple Region: mild Clavicle Bone Region: mild Shoulder and Acromion Bone Region: mild Dorsal Hand: moderate Hair: reviewed  Eyes: reviewed  Mouth: reviewed  Skin: reviewed (dry) Nails: reviewed    Medications: zofran , zofran -odt, roxicodone , protonix , scopolamine , senna   Labs: 4/17 labs reviewed    Anthropometrics:   Height: 5'3 Weight: 125.2 lb redia 4/15) UBW: 128 lb (11/12/23) BMI: 22.19   NUTRITION DIAGNOSIS: Altered GI function related to gastric MALT lymphoma as evidenced by nausea, vomiting, abdominal pain   INTERVENTION:  Discussed strategies for nausea, foods best tolerated and foods to limit/avoid - handout with tips and list of foods provided  Recommend well cooked vegetables and soft fruits without skin - handout with ideas provided  Continue drinking Ensure Clear 2-3/day as tolerated - samples + coupons One bag of food + toiletries from [employer] provided Consider referral to palliative for symptom management/pain control Contact information provided   MONITORING, EVALUATION, GOAL: Pt  will tolerate adequate calories and protein to minimize further wt loss    Next Visit: To be scheduled

## 2024-03-21 NOTE — Telephone Encounter (Signed)
 Noted that patient went to ER last night with testing done, but no intervention. Sent message to Dr. Royanne Core staff asking if she could be seen today (03/21/24)

## 2024-03-24 ENCOUNTER — Other Ambulatory Visit: Payer: Self-pay

## 2024-03-24 ENCOUNTER — Ambulatory Visit
Admission: RE | Admit: 2024-03-24 | Discharge: 2024-03-24 | Disposition: A | Discharge status: Home / Self Care | Source: Ambulatory Visit | Attending: Radiation Oncology | Admitting: Radiation Oncology

## 2024-03-24 DIAGNOSIS — C884 Extranodal marginal zone b-cell lymphoma of mucosa-associated lymphoid tissue (malt-lymphoma) not having achieved remission: Secondary | ICD-10-CM

## 2024-03-24 DIAGNOSIS — R112 Nausea with vomiting, unspecified: Secondary | ICD-10-CM

## 2024-03-24 LAB — RAD ONC ARIA SESSION SUMMARY
Course Elapsed Days: 21
Plan Fractions Treated to Date: 15
Plan Prescribed Dose Per Fraction: 1.7 Gy
Plan Total Fractions Prescribed: 18
Plan Total Prescribed Dose: 30.6 Gy
Reference Point Dosage Given to Date: 25.5 Gy
Reference Point Session Dosage Given: 1.7 Gy
Session Number: 15

## 2024-03-25 ENCOUNTER — Inpatient Hospital Stay

## 2024-03-25 ENCOUNTER — Ambulatory Visit
Admission: RE | Admit: 2024-03-25 | Discharge: 2024-03-25 | Disposition: A | Discharge status: Home / Self Care | Source: Ambulatory Visit | Attending: Radiation Oncology | Admitting: Radiation Oncology

## 2024-03-25 ENCOUNTER — Other Ambulatory Visit: Payer: Self-pay

## 2024-03-25 ENCOUNTER — Ambulatory Visit

## 2024-03-25 DIAGNOSIS — C884 Extranodal marginal zone b-cell lymphoma of mucosa-associated lymphoid tissue (malt-lymphoma) not having achieved remission: Secondary | ICD-10-CM | POA: Diagnosis not present

## 2024-03-25 DIAGNOSIS — R112 Nausea with vomiting, unspecified: Secondary | ICD-10-CM

## 2024-03-25 LAB — RAD ONC ARIA SESSION SUMMARY
Course Elapsed Days: 22
Plan Fractions Treated to Date: 16
Plan Prescribed Dose Per Fraction: 1.7 Gy
Plan Total Fractions Prescribed: 18
Plan Total Prescribed Dose: 30.6 Gy
Reference Point Dosage Given to Date: 27.2 Gy
Reference Point Session Dosage Given: 1.7 Gy
Session Number: 16

## 2024-03-25 LAB — MAGNESIUM: Magnesium: 1.9 mg/dL (ref 1.7–2.4)

## 2024-03-25 LAB — BASIC METABOLIC PANEL - CANCER CENTER ONLY
Anion gap: 5 (ref 5–15)
BUN: 10 mg/dL (ref 6–20)
CO2: 31 mmol/L (ref 22–32)
Calcium: 9.5 mg/dL (ref 8.9–10.3)
Chloride: 106 mmol/L (ref 98–111)
Creatinine: 0.74 mg/dL (ref 0.44–1.00)
GFR, Estimated: >60 mL/min (ref >60)
Glucose, Bld: 108 mg/dL — ABNORMAL HIGH (ref 70–99)
Potassium: 3.8 mmol/L (ref 3.5–5.1)
Sodium: 142 mmol/L (ref 135–145)

## 2024-03-25 MED ORDER — SODIUM CHLORIDE 0.9 % IV SOLN
INTRAVENOUS | Status: AC
Start: 2024-03-25 — End: 2024-03-25

## 2024-03-25 NOTE — Progress Notes (Signed)
 [facility] CSW Progress Note  Clinical Child psychotherapist contacted patient by phone to follow up after initial contact with covering CSW. Patient's primary needs are financial assistance and concerns about continued nausea and fatigue.  Patient has been on FMLA due to side effects of treatment and is in need of assistance with rent. CSW confirmed that covering CSW sent in referral for Cancer Services (this CSW sent an email for update). Patient contacted Leukemia [employer] Lymphoma [employer] ([employer]) for additional assistance and received packet in the mail. CSW Reminded patient to call LLS with questions, they are able to clarify which assistance is available.   CSW staffed concerns with patient's Med Onc and Rad Onc team. Rad Onc team is aware of symptoms (fluids ordered), and will continue to monitor. CSW inquired if Palliative Care referral is appropriate for ongoing monitoring.   Patient is aware of Palliative Resource is available.    Maudie Sorrow, LCSW Clinical Social Worker [facility]

## 2024-03-25 NOTE — Patient Instructions (Signed)

## 2024-03-26 ENCOUNTER — Ambulatory Visit

## 2024-03-26 ENCOUNTER — Ambulatory Visit
Admission: RE | Admit: 2024-03-26 | Discharge: 2024-03-26 | Discharge status: Home / Self Care | Source: Ambulatory Visit | Attending: Radiation Oncology | Admitting: Radiation Oncology

## 2024-03-26 ENCOUNTER — Other Ambulatory Visit: Payer: Self-pay

## 2024-03-26 DIAGNOSIS — C884 Extranodal marginal zone b-cell lymphoma of mucosa-associated lymphoid tissue (malt-lymphoma) not having achieved remission: Secondary | ICD-10-CM | POA: Diagnosis not present

## 2024-03-26 LAB — RAD ONC ARIA SESSION SUMMARY
Course Elapsed Days: 23
Plan Fractions Treated to Date: 17
Plan Prescribed Dose Per Fraction: 1.7 Gy
Plan Total Fractions Prescribed: 18
Plan Total Prescribed Dose: 30.6 Gy
Reference Point Dosage Given to Date: 28.9 Gy
Reference Point Session Dosage Given: 1.7 Gy
Session Number: 17

## 2024-03-27 ENCOUNTER — Other Ambulatory Visit: Payer: Self-pay

## 2024-03-27 ENCOUNTER — Ambulatory Visit

## 2024-03-27 ENCOUNTER — Ambulatory Visit
Admission: RE | Admit: 2024-03-27 | Discharge: 2024-03-27 | Disposition: A | Discharge status: Home / Self Care | Source: Ambulatory Visit | Attending: Radiation Oncology | Admitting: Radiation Oncology

## 2024-03-27 ENCOUNTER — Other Ambulatory Visit: Payer: Self-pay | Admitting: Radiation Oncology

## 2024-03-27 DIAGNOSIS — C884 Extranodal marginal zone b-cell lymphoma of mucosa-associated lymphoid tissue (malt-lymphoma) not having achieved remission: Secondary | ICD-10-CM | POA: Diagnosis not present

## 2024-03-27 DIAGNOSIS — C8599 Non-Hodgkin lymphoma, unspecified, extranodal and solid organ sites: Secondary | ICD-10-CM

## 2024-03-27 LAB — RAD ONC ARIA SESSION SUMMARY
Course Elapsed Days: 24
Plan Fractions Treated to Date: 18
Plan Prescribed Dose Per Fraction: 1.7 Gy
Plan Total Fractions Prescribed: 18
Plan Total Prescribed Dose: 30.6 Gy
Reference Point Dosage Given to Date: 30.6 Gy
Reference Point Session Dosage Given: 1.7 Gy
Session Number: 18

## 2024-03-27 MED ORDER — SCOPOLAMINE 1 MG/3DAYS TD PT72: 1.0000 | MEDICATED_PATCH | TRANSDERMAL | 0 refills | Status: DC | PRN

## 2024-03-27 MED ORDER — PANTOPRAZOLE SODIUM 40 MG PO TBEC: 40.0000 mg | DELAYED_RELEASE_TABLET | Freq: Two times a day (BID) | ORAL | 1 refills | Status: DC

## 2024-03-28 ENCOUNTER — Inpatient Hospital Stay

## 2024-03-28 ENCOUNTER — Encounter: Payer: Self-pay | Admitting: Oncology

## 2024-03-28 NOTE — Radiation Completion Notes (Addendum)
  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 She developed fatigue, nausea, diarrhea, and weight loss throughout her treatment.   The patient will return in one month and will continue follow up with Dr. Scherrie Curt as well.      Amiel Kalata, PA-C

## 2024-04-03 ENCOUNTER — Encounter: Payer: Self-pay | Admitting: Oncology

## 2024-04-04 ENCOUNTER — Telehealth: Payer: Self-pay

## 2024-04-04 NOTE — Telephone Encounter (Signed)
 The patient called reporting significant weakness, vomiting, and nausea. She described a sensation of a lump in her throat, which is causing difficulty swallowing food and fluids. She is unable to keep food and fluids down and has experienced episodes of profound weakness, including being unable to get out of bed. She also reports a foul smell in her mouth emanating from her throat. The patient noted that these symptoms began approximately a four week ago during radiology procedures. Currently, she is vomiting approximately 4 ounces of fluids daily. I have spoken with the provider, who advised that the patient should go to [facility] Emergency Department for evaluation of her symptoms. The patient acknowledged this recommendation and agreed to seek emergency care.

## 2024-04-07 ENCOUNTER — Inpatient Hospital Stay (HOSPITAL_COMMUNITY)
Admission: EM | Admit: 2024-04-07 | Discharge: 2024-04-10 | DRG: 842 | Disposition: A | Discharge status: Home / Self Care | Attending: Family Medicine | Admitting: Family Medicine

## 2024-04-07 ENCOUNTER — Other Ambulatory Visit: Payer: Self-pay

## 2024-04-07 ENCOUNTER — Telehealth: Payer: Self-pay

## 2024-04-07 ENCOUNTER — Encounter (HOSPITAL_COMMUNITY): Payer: Self-pay

## 2024-04-07 DIAGNOSIS — Z888 Allergy status to other drugs, medicaments and biological substances status: Secondary | ICD-10-CM

## 2024-04-07 DIAGNOSIS — C8593 Non-Hodgkin lymphoma, unspecified, intra-abdominal lymph nodes: Secondary | ICD-10-CM | POA: Diagnosis present

## 2024-04-07 DIAGNOSIS — D72829 Elevated white blood cell count, unspecified: Secondary | ICD-10-CM | POA: Diagnosis present

## 2024-04-07 DIAGNOSIS — Z923 Personal history of irradiation: Secondary | ICD-10-CM

## 2024-04-07 DIAGNOSIS — K5900 Constipation, unspecified: Secondary | ICD-10-CM | POA: Diagnosis present

## 2024-04-07 DIAGNOSIS — K259 Gastric ulcer, unspecified as acute or chronic, without hemorrhage or perforation: Secondary | ICD-10-CM | POA: Diagnosis present

## 2024-04-07 DIAGNOSIS — G43909 Migraine, unspecified, not intractable, without status migrainosus: Secondary | ICD-10-CM | POA: Diagnosis present

## 2024-04-07 DIAGNOSIS — R1310 Dysphagia, unspecified: Secondary | ICD-10-CM | POA: Diagnosis present

## 2024-04-07 DIAGNOSIS — Y842 Radiological procedure and radiotherapy as the cause of abnormal reaction of the patient, or of later complication, without mention of misadventure at the time of the procedure: Secondary | ICD-10-CM | POA: Diagnosis present

## 2024-04-07 DIAGNOSIS — C884 Extranodal marginal zone b-cell lymphoma of mucosa-associated lymphoid tissue (malt-lymphoma) not having achieved remission: Principal | ICD-10-CM | POA: Diagnosis present

## 2024-04-07 DIAGNOSIS — R1084 Generalized abdominal pain: Secondary | ICD-10-CM | POA: Diagnosis present

## 2024-04-07 DIAGNOSIS — F32A Depression, unspecified: Secondary | ICD-10-CM | POA: Diagnosis present

## 2024-04-07 DIAGNOSIS — K319 Disease of stomach and duodenum, unspecified: Secondary | ICD-10-CM | POA: Diagnosis present

## 2024-04-07 DIAGNOSIS — F17210 Nicotine dependence, cigarettes, uncomplicated: Secondary | ICD-10-CM | POA: Diagnosis present

## 2024-04-07 DIAGNOSIS — C8599 Non-Hodgkin lymphoma, unspecified, extranodal and solid organ sites: Secondary | ICD-10-CM | POA: Diagnosis present

## 2024-04-07 DIAGNOSIS — R112 Nausea with vomiting, unspecified: Principal | ICD-10-CM | POA: Diagnosis present

## 2024-04-07 DIAGNOSIS — Z1152 Encounter for screening for COVID-19: Secondary | ICD-10-CM

## 2024-04-07 LAB — COMPREHENSIVE METABOLIC PANEL WITH GFR
ALT: 26 U/L (ref 0–44)
AST: 28 U/L (ref 15–41)
Albumin: 4.8 g/dL (ref 3.5–5.0)
Alkaline Phosphatase: 65 U/L (ref 38–126)
Anion gap: 14 (ref 5–15)
BUN: 13 mg/dL (ref 6–20)
CO2: 24 mmol/L (ref 22–32)
Calcium: 10.2 mg/dL (ref 8.9–10.3)
Chloride: 101 mmol/L (ref 98–111)
Creatinine, Ser: 0.64 mg/dL (ref 0.44–1.00)
GFR, Estimated: >60 mL/min (ref >60)
Glucose, Bld: 120 mg/dL — ABNORMAL HIGH (ref 70–99)
Potassium: 3.1 mmol/L — ABNORMAL LOW (ref 3.5–5.1)
Sodium: 139 mmol/L (ref 135–145)
Total Bilirubin: 0.4 mg/dL (ref 0.0–1.2)
Total Protein: 8.5 g/dL — ABNORMAL HIGH (ref 6.5–8.1)

## 2024-04-07 LAB — CBC WITH DIFFERENTIAL/PLATELET
Abs Immature Granulocytes: 0.02 10*3/uL (ref 0.00–0.07)
Basophils Absolute: 0 10*3/uL (ref 0.0–0.1)
Basophils Relative: 0 %
Eosinophils Absolute: 0 10*3/uL (ref 0.0–0.5)
Eosinophils Relative: 0 %
HCT: 44.2 % (ref 36.0–46.0)
Hemoglobin: 14.5 g/dL (ref 12.0–15.0)
Immature Granulocytes: 0 %
Lymphocytes Relative: 9 %
Lymphs Abs: 0.7 10*3/uL (ref 0.7–4.0)
MCH: 30.4 pg (ref 26.0–34.0)
MCHC: 32.8 g/dL (ref 30.0–36.0)
MCV: 92.7 fL (ref 80.0–100.0)
Monocytes Absolute: 0.8 10*3/uL (ref 0.1–1.0)
Monocytes Relative: 11 %
Neutro Abs: 6 10*3/uL (ref 1.7–7.7)
Neutrophils Relative %: 80 %
Platelets: 196 10*3/uL (ref 150–400)
RBC: 4.77 MIL/uL (ref 3.87–5.11)
RDW: 13.1 % (ref 11.5–15.5)
WBC: 7.5 10*3/uL (ref 4.0–10.5)
nRBC: 0 % (ref 0.0–0.2)

## 2024-04-07 LAB — RESP PANEL BY RT-PCR (RSV, FLU A&B, COVID)  RVPGX2
Influenza A by PCR: NEGATIVE
Influenza B by PCR: NEGATIVE
Resp Syncytial Virus by PCR: NEGATIVE
SARS Coronavirus 2 by RT PCR: NEGATIVE

## 2024-04-07 LAB — LIPASE, BLOOD: Lipase: 25 U/L (ref 11–51)

## 2024-04-07 LAB — MAGNESIUM: Magnesium: 2.1 mg/dL (ref 1.7–2.4)

## 2024-04-07 MED ORDER — ONDANSETRON HCL 4 MG/2ML IJ SOLN
4.0000 mg | Freq: Once | INTRAMUSCULAR | Status: AC
  Administered 2024-04-07: 4 mg via INTRAVENOUS
  Filled 2024-04-07: qty 2

## 2024-04-07 NOTE — ED Triage Notes (Signed)
 Vomiting since Friday. No relief with at home zofran . Last radiation treatment was 4/24. Last chemotherapy was August 2024. Hx of malt lymphoma.

## 2024-04-07 NOTE — Telephone Encounter (Addendum)
 Nina Horn called in wanting the number to Nina Horn nurse to ask some questions The patient called reporting significant weakness, vomiting, and nausea. She described a sensation of a lump in her throat, which is causing difficulty swallowing food and fluids. She is unable to keep food and fluids down and has experienced episodes of profound weakness, including being unable to get out of bed. I informed her that Nina Horn was off today and encouraged her to report to the emergency department.

## 2024-04-07 NOTE — ED Provider Triage Note (Signed)
 Emergency Medicine Provider Triage Evaluation Note  ANALINA KINDEL , a 53 y.o. female  was evaluated in triage.  Pt complains of nausea and dry heaving since radiation treatment 03/27/2024.  She reports a history of lymphoma.  Denies any fever or chills.  Reports trying scopolamine  patch at home without relief of symptoms.  Review of Systems  Positive: As above Negative: As above  Physical Exam  BP (!) 153/99 (BP Location: Left Arm)   Pulse 98   Temp 98.7 F (37.1 C) (Oral)   Resp (!) 22   Ht 5\' 3"  (1.6 m)   Wt 53 kg   LMP 02/04/2020   SpO2 99%   BMI 20.70 kg/m  Gen:   Awake, no distress   Resp:  Normal effort  MSK:   Moves extremities without difficulty    Medical Decision Making  Medically screening exam initiated at 9:02 PM.  Appropriate orders placed.  Genavieve A Huether was informed that the remainder of the evaluation will be completed by another provider, this initial triage assessment does not replace that evaluation, and the importance of remaining in the ED until their evaluation is complete.     Rexie Catena, PA-C 04/07/24 2102

## 2024-04-07 NOTE — Telephone Encounter (Signed)
 The patient contacted us  to report that she is experiencing persistent symptoms, including the development of constipation. She was advised to visit [facility] for evaluation on Friday; however, she experienced constipation while out and was unable to seek care at that time. The patient reports that her symptoms are worsening and is calling for further assistance. The provider advised her to come in for laboratory testing and additional evaluation, and the patient agreed to this plan.

## 2024-04-08 ENCOUNTER — Inpatient Hospital Stay

## 2024-04-08 ENCOUNTER — Inpatient Hospital Stay: Admitting: Nurse Practitioner

## 2024-04-08 DIAGNOSIS — R112 Nausea with vomiting, unspecified: Secondary | ICD-10-CM | POA: Diagnosis present

## 2024-04-08 LAB — CBC WITH DIFFERENTIAL/PLATELET
Abs Immature Granulocytes: 0 10*3/uL (ref 0.00–0.07)
Basophils Absolute: 0 10*3/uL (ref 0.0–0.1)
Basophils Relative: 0 %
Eosinophils Absolute: 0.1 10*3/uL (ref 0.0–0.5)
Eosinophils Relative: 2 %
HCT: 37 % (ref 36.0–46.0)
Hemoglobin: 12.4 g/dL (ref 12.0–15.0)
Immature Granulocytes: 0 %
Lymphocytes Relative: 13 %
Lymphs Abs: 0.5 10*3/uL — ABNORMAL LOW (ref 0.7–4.0)
MCH: 31.1 pg (ref 26.0–34.0)
MCHC: 33.5 g/dL (ref 30.0–36.0)
MCV: 92.7 fL (ref 80.0–100.0)
Monocytes Absolute: 0.8 10*3/uL (ref 0.1–1.0)
Monocytes Relative: 21 %
Neutro Abs: 2.4 10*3/uL (ref 1.7–7.7)
Neutrophils Relative %: 64 %
Platelets: 159 10*3/uL (ref 150–400)
RBC: 3.99 MIL/uL (ref 3.87–5.11)
RDW: 13.2 % (ref 11.5–15.5)
WBC: 3.8 10*3/uL — ABNORMAL LOW (ref 4.0–10.5)
nRBC: 0 % (ref 0.0–0.2)

## 2024-04-08 LAB — COMPREHENSIVE METABOLIC PANEL WITH GFR
ALT: 21 U/L (ref 0–44)
AST: 22 U/L (ref 15–41)
Albumin: 3.6 g/dL (ref 3.5–5.0)
Alkaline Phosphatase: 52 U/L (ref 38–126)
Anion gap: 7 (ref 5–15)
BUN: 12 mg/dL (ref 6–20)
CO2: 24 mmol/L (ref 22–32)
Calcium: 9 mg/dL (ref 8.9–10.3)
Chloride: 109 mmol/L (ref 98–111)
Creatinine, Ser: 0.63 mg/dL (ref 0.44–1.00)
GFR, Estimated: >60 mL/min (ref >60)
Glucose, Bld: 86 mg/dL (ref 70–99)
Potassium: 3.7 mmol/L (ref 3.5–5.1)
Sodium: 140 mmol/L (ref 135–145)
Total Bilirubin: 0.5 mg/dL (ref 0.0–1.2)
Total Protein: 6.1 g/dL — ABNORMAL LOW (ref 6.5–8.1)

## 2024-04-08 LAB — MAGNESIUM: Magnesium: 2 mg/dL (ref 1.7–2.4)

## 2024-04-08 MED ORDER — POTASSIUM CHLORIDE 10 MEQ/100ML IV SOLN
10.0000 meq | INTRAVENOUS | Status: AC
  Administered 2024-04-08 (×4): 10 meq via INTRAVENOUS
  Filled 2024-04-08 (×4): qty 100

## 2024-04-08 MED ORDER — OXYCODONE HCL 5 MG PO TABS: 5.0000 mg | ORAL_TABLET | Freq: Four times a day (QID) | ORAL | Status: DC | PRN

## 2024-04-08 MED ORDER — MAGNESIUM SULFATE 2 GM/50ML IV SOLN
2.0000 g | Freq: Once | INTRAVENOUS | Status: AC
  Administered 2024-04-08: 2 g via INTRAVENOUS
  Filled 2024-04-08: qty 50

## 2024-04-08 MED ORDER — MELATONIN 3 MG PO TABS: 3.0000 mg | ORAL_TABLET | Freq: Every evening | ORAL | Status: DC | PRN

## 2024-04-08 MED ORDER — LORAZEPAM 2 MG/ML IJ SOLN
0.5000 mg | INTRAMUSCULAR | Status: DC | PRN
  Administered 2024-04-08 – 2024-04-09 (×3): 0.5 mg via INTRAVENOUS
  Filled 2024-04-08 (×3): qty 1

## 2024-04-08 MED ORDER — SCOPOLAMINE 1 MG/3DAYS TD PT72: 1.0000 | MEDICATED_PATCH | TRANSDERMAL | Status: DC | PRN

## 2024-04-08 MED ORDER — ACETAMINOPHEN 650 MG RE SUPP
650.0000 mg | Freq: Four times a day (QID) | RECTAL | Status: DC | PRN
Start: 2024-04-08 — End: 2024-04-10

## 2024-04-08 MED ORDER — ACETAMINOPHEN 325 MG PO TABS: 650.0000 mg | ORAL_TABLET | Freq: Four times a day (QID) | ORAL | Status: DC | PRN

## 2024-04-08 MED ORDER — DIPHENHYDRAMINE HCL 50 MG/ML IJ SOLN
25.0000 mg | Freq: Four times a day (QID) | INTRAMUSCULAR | Status: DC | PRN
  Filled 2024-04-08 (×2): qty 1

## 2024-04-08 MED ORDER — HALOPERIDOL LACTATE 5 MG/ML IJ SOLN
2.0000 mg | Freq: Once | INTRAMUSCULAR | Status: AC
  Administered 2024-04-08: 2 mg via INTRAVENOUS
  Filled 2024-04-08: qty 1

## 2024-04-08 MED ORDER — PANTOPRAZOLE SODIUM 40 MG IV SOLR
40.0000 mg | Freq: Once | INTRAVENOUS | Status: AC
  Administered 2024-04-08: 40 mg via INTRAVENOUS
  Filled 2024-04-08: qty 10

## 2024-04-08 MED ORDER — POLYETHYLENE GLYCOL 3350 17 G PO PACK: 17.0000 g | PACK | Freq: Every day | ORAL | Status: DC | PRN

## 2024-04-08 MED ORDER — SODIUM CHLORIDE 0.9 % IV SOLN: INTRAVENOUS | Status: DC

## 2024-04-08 MED ORDER — SENNOSIDES-DOCUSATE SODIUM 8.6-50 MG PO TABS: 1.0000 | ORAL_TABLET | Freq: Every evening | ORAL | Status: DC | PRN

## 2024-04-08 MED ORDER — SUCRALFATE 1 G PO TABS
1.0000 g | ORAL_TABLET | Freq: Three times a day (TID) | ORAL | Status: DC
  Administered 2024-04-08 – 2024-04-10 (×6): 1 g via ORAL
  Filled 2024-04-08 (×6): qty 1

## 2024-04-08 MED ORDER — HALOPERIDOL LACTATE 5 MG/ML IJ SOLN
2.0000 mg | Freq: Once | INTRAMUSCULAR | Status: AC | PRN
  Administered 2024-04-08: 2 mg via INTRAVENOUS
  Filled 2024-04-08: qty 1

## 2024-04-08 MED ORDER — PANTOPRAZOLE SODIUM 40 MG PO TBEC
40.0000 mg | DELAYED_RELEASE_TABLET | Freq: Two times a day (BID) | ORAL | Status: DC
  Administered 2024-04-09 – 2024-04-10 (×3): 40 mg via ORAL
  Filled 2024-04-08 (×3): qty 1

## 2024-04-08 MED ORDER — SODIUM CHLORIDE 0.9 % IV BOLUS
1000.0000 mL | INTRAVENOUS | Status: AC
  Administered 2024-04-08: 1000 mL via INTRAVENOUS

## 2024-04-08 NOTE — Plan of Care (Signed)

## 2024-04-08 NOTE — ED Provider Notes (Signed)
 [place] EMERGENCY DEPARTMENT AT [facility] Provider Note   CSN: [id number] Arrival date & time: 04/07/24  2040     History  Chief Complaint  Patient presents with   Emesis    Nina Horn is a 53 y.o. female.  The history is provided by the patient.  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  She is unable to tolerate zofran  ODT and is allergic to phenergan and reglan .  She also reports an odd taste in the mouth that is making her symptoms worse.       Home Medications Prior to Admission medications   Medication Sig Start Date End Date Taking? Authorizing Provider  acetaminophen  (TYLENOL ) 500 MG tablet Take 500-1,000 mg by mouth every 6 (six) hours as needed for moderate pain (pain score 4-6).    [provider]  lidocaine  (XYLOCAINE ) 2 % solution Use as directed 15 mLs in the mouth or throat 2 (two) times daily. 01/30/24   [provider]  lidocaine  (XYLOCAINE ) 2 % solution Use as directed 15 mLs in the mouth or throat as needed. 20 minutes Prior to meals 03/13/24   [name], MD  ondansetron  (ZOFRAN ) 4 MG tablet Take 4 mg by mouth every 8 (eight) hours as needed for vomiting or nausea. 01/24/23   [provider]  ondansetron  (ZOFRAN -ODT) 8 MG disintegrating tablet Take 1 tablet (8 mg total) by mouth every 8 (eight) hours as needed for nausea or vomiting. 03/20/24   Sumner Ends, MD  oxyCODONE  (OXY IR/ROXICODONE ) 5 MG immediate release tablet Take 1 tablet (5 mg  total) by mouth every 6 (six) hours as needed. 01/30/24   [name], [name], MD  pantoprazole  (PROTONIX ) 40 MG tablet Take 1 tablet (40 mg total) by mouth 2 (two) times daily before a meal. 03/27/24 05/26/24  [name], MD  polyethylene glycol (MIRALAX  / GLYCOLAX ) 17 g packet Take 17 g by mouth daily as needed for moderate constipation or severe constipation. 01/30/24   [name], [name], MD  scopolamine  (TRANSDERM-SCOP) 1 MG/3DAYS Place 1 patch (1.5 mg total) onto the skin every 3 (three) days as needed (N/V). 03/27/24   [name], MD  senna-docusate (SENOKOT-S) 8.6-50 MG tablet Take 1 tablet by mouth at bedtime as needed for mild constipation. 01/30/24   [name], [name], MD  ranitidine  (ZANTAC ) 150 MG tablet Take 1 tablet (150 mg total) by mouth 2 (two) times daily. Patient not taking: Reported on 10/13/2018 07/27/18 04/05/20  Emmet Harm, MD      Allergies    Promethazine hcl, Reglan  [metoclopramide ], and Rituximab -pvvr    Review of Systems   Review of Systems  Constitutional:  Negative for fever.  HENT:  Negative for facial swelling.   Respiratory:  Negative for wheezing and stridor.   Cardiovascular:  Negative for chest pain.  Gastrointestinal:  Positive for nausea and vomiting. Negative for diarrhea.  All other systems reviewed and are negative.   Physical Exam Updated Vital Signs BP (!) 104/59   Pulse 73  Temp 98.7 F (37.1 C)   Resp (!) 21   Ht 5\' 3"  (1.6 m)   Wt 53 kg   LMP 02/04/2020   SpO2 97%   BMI 20.70 kg/m  Physical Exam Vitals and nursing note reviewed.  Constitutional:      General: She is not in acute distress.    Appearance: Normal appearance. She is well-developed.  HENT:     Head: Normocephalic and atraumatic.     Nose: Nose normal.  Eyes:     Pupils: Pupils are equal, round, and reactive to light.  Cardiovascular:     Rate and Rhythm: Normal rate and regular rhythm.     Pulses: Normal pulses.     Heart sounds: Normal heart sounds.  Pulmonary:      Effort: Pulmonary effort is normal. No respiratory distress.     Breath sounds: Normal breath sounds.  Abdominal:     General: Bowel sounds are normal. There is no distension.     Palpations: Abdomen is soft.     Tenderness: There is no abdominal tenderness. There is no guarding or rebound.  Musculoskeletal:        General: Normal range of motion.     Cervical back: Normal range of motion and neck supple.  Skin:    General: Skin is warm and dry.     Capillary Refill: Capillary refill takes less than 2 seconds.     Findings: No erythema or rash.  Neurological:     General: No focal deficit present.     Mental Status: She is alert.     Deep Tendon Reflexes: Reflexes normal.  Psychiatric:        Mood and Affect: Mood normal.     ED Results / Procedures / Treatments   Labs (all labs ordered are listed, but only abnormal results are displayed) Results for orders placed or performed during the hospital encounter of 04/07/24  Resp panel by RT-PCR (RSV, Flu A&B, Covid) Anterior Nasal Swab   Collection Time: 04/07/24  9:18 PM   Specimen: Anterior Nasal Swab  Result Value Ref Range   SARS Coronavirus 2 by RT PCR NEGATIVE NEGATIVE   Influenza A by PCR NEGATIVE NEGATIVE   Influenza B by PCR NEGATIVE NEGATIVE   Resp Syncytial Virus by PCR NEGATIVE NEGATIVE  CBC with Differential   Collection Time: 04/07/24  9:18 PM  Result Value Ref Range   WBC 7.5 4.0 - 10.5 K/uL   RBC 4.77 3.87 - 5.11 MIL/uL   Hemoglobin 14.5 12.0 - 15.0 g/dL   HCT 16.1 09.6 - 04.5 %   MCV 92.7 80.0 - 100.0 fL   MCH 30.4 26.0 - 34.0 pg   MCHC 32.8 30.0 - 36.0 g/dL   RDW 40.9 81.1 - 91.4 %   Platelets 196 150 - 400 K/uL   nRBC 0.0 0.0 - 0.2 %   Neutrophils Relative % 80 %   Neutro Abs 6.0 1.7 - 7.7 K/uL   Lymphocytes Relative 9 %   Lymphs Abs 0.7 0.7 - 4.0 K/uL   Monocytes Relative 11 %   Monocytes Absolute 0.8 0.1 - 1.0 K/uL   Eosinophils Relative 0 %   Eosinophils Absolute 0.0 0.0 - 0.5 K/uL   Basophils  Relative 0 %   Basophils Absolute 0.0 0.0 - 0.1 K/uL   Immature Granulocytes 0 %   Abs Immature Granulocytes 0.02 0.00 - 0.07 K/uL  Comprehensive metabolic panel   Collection Time: 04/07/24  9:18 PM  Result  Value Ref Range   Sodium 139 135 - 145 mmol/L   Potassium 3.1 (L) 3.5 - 5.1 mmol/L   Chloride 101 98 - 111 mmol/L   CO2 24 22 - 32 mmol/L   Glucose, Bld 120 (H) 70 - 99 mg/dL   BUN 13 6 - 20 mg/dL   Creatinine, Ser 4.78 0.44 - 1.00 mg/dL   Calcium 29.5 8.9 - 62.1 mg/dL   Total Protein 8.5 (H) 6.5 - 8.1 g/dL   Albumin 4.8 3.5 - 5.0 g/dL   AST 28 15 - 41 U/L   ALT 26 0 - 44 U/L   Alkaline Phosphatase 65 38 - 126 U/L   Total Bilirubin 0.4 0.0 - 1.2 mg/dL   GFR, Estimated >30 >86 mL/min   Anion gap 14 5 - 15  Lipase, blood   Collection Time: 04/07/24  9:18 PM  Result Value Ref Range   Lipase 25 11 - 51 U/L  Magnesium    Collection Time: 04/07/24  9:18 PM  Result Value Ref Range   Magnesium  2.1 1.7 - 2.4 mg/dL   CT Soft Tissue Neck W Contrast Result Date: 03/21/2024 CLINICAL DATA:  Occult malignancy Patient with diagnosis of lymphoma now has a fullness feeling in her throat. EXAM: CT NECK WITH CONTRAST TECHNIQUE: Multidetector CT imaging of the neck was performed using the standard protocol following the bolus administration of intravenous contrast. RADIATION DOSE REDUCTION: This exam was performed according to the departmental dose-optimization program which includes automated exposure control, adjustment of the mA and/or kV according to patient size and/or use of iterative reconstruction technique. CONTRAST:  75mL OMNIPAQUE  IOHEXOL  300 MG/ML  SOLN COMPARISON:  PET CT March 12, 25.  At FINDINGS: Pharynx and larynx: Significantly limited evaluation of the pharynx due to significant motion. Salivary glands: No inflammation, mass, or stone. Thyroid : Normal. Lymph nodes: No visible lymphadenopathy. Limited evaluation at the skull base due to motion. Vascular: Negative. Limited  intracranial: Negative. Visualized orbits: Negative. Mastoids and visualized paranasal sinuses: Clear. Skeleton: No acute or aggressive process. Upper chest: Visualized lung apices are clear. IMPRESSION: Significantly limited evaluation of the pharynx and skull base due to motion. Within this limitation, no visible mass or lymphadenopathy. Electronically Signed   By: Stevenson Elbe M.D.   On: 03/21/2024 00:57   DG Chest Port 1 View Result Date: 03/20/2024 CLINICAL DATA:  Upper chest discomfort patient receiving radiation to epigastric part of the abdomen. EXAM: PORTABLE CHEST - 1 VIEW COMPARISON:  10/13/2018. FINDINGS: Cardiac silhouette is unremarkable. No pneumothorax or pleural effusion. The lungs are clear. The visualized skeletal structures are unremarkable. IMPRESSION: No acute cardiopulmonary process. Electronically Signed   By: Sydell Eva M.D.   On: 03/20/2024 21:15    EKG EKG Interpretation Date/Time:  Tuesday Apr 08 2024 00:48:23 EDT Ventricular Rate:  71 PR Interval:  148 QRS Duration:  86 QT Interval:  379 QTC Calculation: 412 R Axis:   85  Text Interpretation: Sinus rhythm Confirmed by Maralee Senate, Zulma Court ([id number]) on 04/08/2024 12:50:13 AM  Radiology No results found.  Procedures Procedures    Medications Ordered in ED Medications  0.9 %  sodium chloride  infusion (has no administration in time range)  acetaminophen  (TYLENOL ) tablet 650 mg (has no administration in time range)    Or  acetaminophen  (TYLENOL ) suppository 650 mg (has no administration in time range)  melatonin tablet 3 mg (has no administration in time range)  potassium chloride  10 mEq in 100 mL IVPB (has no administration in time range)  LORazepam (ATIVAN) injection 0.5 mg (has no administration in time range)  ondansetron  (ZOFRAN ) injection 4 mg (4 mg Intravenous Given 04/07/24 2134)  sodium chloride  0.9 % bolus 1,000 mL (0 mLs Intravenous Stopped 04/08/24 0335)  haloperidol lactate (HALDOL) injection 2 mg  (2 mg Intravenous Given 04/08/24 0052)    ED Course/ Medical Decision Making/ A&P                                 Medical Decision Making Nausea and vomiting through zofran    Amount and/or Complexity of Data Reviewed Independent Historian:     Details: Relative see above  External Data Reviewed: notes.    Details: Previous notes reviewed  Labs: ordered.    Details: Negative covid and flu, normal white count 7.5, normal hemoglobin 14.5, normal platelets.  Normal sodium 139, potassium slight low 3.1, normal creatinine   Risk Prescription drug management. Decision regarding hospitalization.    Final Clinical Impression(s) / ED Diagnoses Final diagnoses:  None   The patient appears reasonably stabilized for admission considering the current resources, flow, and capabilities available in the ED at this time, and I doubt any other [facility] requiring further screening and/or treatment in the ED prior to admission.  Rx / DC Orders ED Discharge Orders     None         Paysen Goza, MD 04/08/24 [id number]

## 2024-04-08 NOTE — H&P (Signed)
 History and Physical    Patient: Nina Horn:[id number] DOB: [birth year] DOA: 04/07/2024 DOS: the patient was seen and examined on 04/08/2024 PCP: Dianah Fort, PA  Patient coming from: Home  Chief Complaint:  Chief Complaint  Patient presents with   Emesis   HPI: Nina Horn is a 53 y.o. female with medical history significant of mild to moderate receiving radiation therapy of the left upper quadrant 3 days ago developing nausea, emesis and generalized weakness since then.  She is allergic to Phenergan and Reglan . She was unable to tolerate or get relief from Zofran  ODT. She feels that not taste in her mouth which is making symptoms worse. No fever, chills or night sweats. No sore throat, rhinorrhea, dyspnea, wheezing or hemoptysis. No chest pain, palpitations, diaphoresis, PND, orthopnea or pitting edema of the lower extremities. No diarrhea, melena or hematochezia. No flank pain, dysuria, frequency or hematuria. No polyuria, polydipsia, polyphagia or blurred vision.  Lab work: Initial CBC, magnesium , lipase were unremarkable.  Second CBC showed leukopenia at 3.8 with 64% neutrophils.  CMP showed a potassium of 3.1 mmol/L, glucose 120 mg/dL and total protein 8.5 g/dL.  Coronavirus influenza PCR was negative.  ED course: Initial vital signs were temperature 98.7 F, pulse 98, respiration 22, BP 153/99 mmHg O2 sat 99% on room air.  The patient had received haloperidol 2 mg IVP x 2, magnesium  sulfate 2 g IVPB, ondansetron  4 mg IVP, pantoprazole  40 mg IVP, normal saline 1000 mL liter bolus and KCl 10 mEq IVPB x 4.   Review of Systems: As mentioned in the history of present illness. All other systems reviewed and are negative. Past Medical History:  Diagnosis Date   Cancer [facility])    Depression    Migraines    Past Surgical History:  Procedure Laterality Date   BIOPSY  01/28/2024   Procedure: BIOPSY;  Surgeon: Evangeline Hilts, MD;  Location: [facility] ENDOSCOPY;  Service:  Gastroenterology;;   ESOPHAGOGASTRODUODENOSCOPY (EGD) WITH PROPOFOL  Left 01/28/2024   Procedure: ESOPHAGOGASTRODUODENOSCOPY (EGD) WITH PROPOFOL ;  Surgeon: Evangeline Hilts, MD;  Location: [facility] ENDOSCOPY;  Service: Gastroenterology;  Laterality: Left;   PILONIDAL CYST EXCISION     WISDOM TOOTH EXTRACTION     Social History:  reports that she has been smoking cigarettes. She has never used smokeless tobacco. She reports current drug use. Drug: Marijuana. She reports that she does not drink alcohol.  Allergies  Allergen Reactions   Promethazine Hcl Other (See Comments)    delirium   Reglan  [Metoclopramide ] Other (See Comments)    Delirium    Rituximab -Pvvr Nausea And Vomiting    Patient had hypersensitivity reaction to Rituxan . See progress note from 06/14/2023 at 1231. Patient able to complete infusion.    Family History  Problem Relation Age of Onset   Skeletal dysplasia Daughter        Thanatophoric dysplasia    Prior to Admission medications   Medication Sig Start Date End Date Taking? Authorizing Provider  acetaminophen  (TYLENOL ) 500 MG tablet Take 500-1,000 mg by mouth every 6 (six) hours as needed for moderate pain (pain score 4-6).   Yes [provider]  ondansetron  (ZOFRAN -ODT) 8 MG disintegrating tablet Take 1 tablet (8 mg total) by mouth every 8 (eight) hours as needed for nausea or vomiting. 03/20/24  Yes Sumner Ends, MD  oxyCODONE  (OXY IR/ROXICODONE ) 5 MG immediate release tablet Take 1 tablet (5 mg total) by mouth every 6 (six) hours as needed. 01/30/24  Yes [name], [name],  MD  pantoprazole  (PROTONIX ) 40 MG tablet Take 1 tablet (40 mg total) by mouth 2 (two) times daily before a meal. 03/27/24 05/26/24 Yes [name], MD  polyethylene glycol (MIRALAX  / GLYCOLAX ) 17 g packet Take 17 g by mouth daily as needed for moderate constipation or severe constipation. 01/30/24  Yes [name], [name], MD  scopolamine  (TRANSDERM-SCOP) 1 MG/3DAYS Place 1 patch (1.5 mg total) onto the  skin every 3 (three) days as needed (N/V). 03/27/24  Yes [name], MD  senna-docusate (SENOKOT-S) 8.6-50 MG tablet Take 1 tablet by mouth at bedtime as needed for mild constipation. 01/30/24  Yes [name], [name], MD  sucralfate  (CARAFATE ) 1 g tablet Take 1 g by mouth in the morning, at noon, in the evening, and at bedtime.   Yes [provider]  lidocaine  (XYLOCAINE ) 2 % solution Use as directed 15 mLs in the mouth or throat 2 (two) times daily. Patient not taking: Reported on 04/08/2024 01/30/24   [provider]  lidocaine  (XYLOCAINE ) 2 % solution Use as directed 15 mLs in the mouth or throat as needed. 20 minutes Prior to meals Patient not taking: Reported on 04/08/2024 03/13/24   [name], MD  ondansetron  (ZOFRAN ) 4 MG tablet Take 4 mg by mouth every 8 (eight) hours as needed for vomiting or nausea. Patient not taking: Reported on 04/08/2024 01/24/23   [provider]  ranitidine  (ZANTAC ) 150 MG tablet Take 1 tablet (150 mg total) by mouth 2 (two) times daily. Patient not taking: Reported on 10/13/2018 07/27/18 04/05/20  Emmet Harm, MD    Physical Exam: Vitals:   04/08/24 0100 04/08/24 0618 04/08/24 0630 04/08/24 0730  BP: (!) 104/59  103/74 91/62  Pulse: 73  73 61  Resp: (!) 21  19 17   Temp:  98.9 F (37.2 C)    TempSrc:      SpO2: 97%  97% 98%  Weight:      Height:       Physical Exam Vitals and nursing note reviewed.  Constitutional:      Appearance: Normal appearance.  HENT:     Head: Normocephalic.     Nose: No rhinorrhea.     Mouth/Throat:     Mouth: Mucous membranes are dry.  Eyes:     General: No scleral icterus.    Pupils: Pupils are equal, round, and reactive to light.  Cardiovascular:     Rate and Rhythm: Normal rate and regular rhythm.  Pulmonary:     Effort: Pulmonary effort is normal.     Breath sounds: Normal breath sounds. No wheezing, rhonchi or rales.  Abdominal:     General: Bowel sounds are normal. There is no distension.      Palpations: Abdomen is soft.     Tenderness: There is abdominal tenderness. There is no guarding or rebound.  Musculoskeletal:     Cervical back: Neck supple.     Right lower leg: No edema.     Left lower leg: No edema.  Skin:    General: Skin is warm and dry.  Neurological:     General: No focal deficit present.     Mental Status: She is alert and oriented to person, place, and time.  Psychiatric:        Mood and Affect: Mood normal.        Behavior: Behavior normal.     Data Reviewed:  Results are pending, will review when available.  EKG: Vent. rate 71 BPM PR interval 148 ms  QRS duration 86 ms QT/QTcB 379/412 ms P-R-T axes 91 85 33 Sinus rhythm  Assessment and Plan: Principal Problem:   Intractable nausea and vomiting Observation/telemetry. Continue IV fluids. Clear liquid diet. Analgesics as needed. Antiemetics as needed. Pantoprazole  40 mg IVP x 1. Resume oral pantoprazole  tomorrow. Resume Carafate . Avoid constipation. Follow CBC, CMP and lipase in AM.  Active Problems:   MALT lymphoma   Gastric lymphoma (HCC) Follow-up with oncology/radiation oncology as scheduled.    Diffuse abdominal pain Analgesics as needed.     Advance Care Planning:   Code Status: Full Code   Consults:   Family Communication:   Severity of Illness: The appropriate patient status for this patient is INPATIENT. Inpatient status is judged to be reasonable and necessary in order to provide the required intensity of service to ensure the patient's safety. The patient's presenting symptoms, physical exam findings, and initial radiographic and laboratory data in the context of their chronic comorbidities is felt to place them at high risk for further clinical deterioration. Furthermore, it is not anticipated that the patient will be medically stable for discharge from the hospital within 2 midnights of admission.   * I certify that at the point of admission it is my clinical  judgment that the patient will require inpatient hospital care spanning beyond 2 midnights from the point of admission due to high intensity of service, high risk for further deterioration and high frequency of surveillance required.*  Author: Danice Dural, MD 04/08/2024 8:07 AM  For on call review [email or web address].   This document was prepared using Dragon voice recognition software and may contain some unintended transcription errors.

## 2024-04-08 NOTE — Progress Notes (Signed)
 IP PROGRESS NOTE  Subjective:   Nina Horn is well-known to me with a history of gastric MALT lymphoma.  She had persistent disease following treatment with rituximab  and completed a course of palliative radiation to the stomach and surrounding lymph nodes 03/03/2024 - 03/27/2024.  She reports developing nausea during the first week of radiation.  This has persistent.  She has vomiting and difficulty holding down liquids.  Ondansetron  and a scopolamine  patch have not helped.  She has been constipated.  She was seen in the emergency room 03/20/2024 for intravenous fluids and Zofran .  She reports difficulty swallowing with a "lump "in the throat.  A CT of the neck on 03/20/2024 was negative. She presented emergency room today with persistent nausea and vomiting.  She received intravenous fluids, IV Zofran , and IV Ativan. He denies headache, visual change, fever, and dysuria. Objective: Vital signs in last 24 hours: Blood pressure 115/79, pulse 63, temperature 98.3 F (36.8 C), temperature source Oral, resp. rate (!) 23, height 5\' 3"  (1.6 m), weight 116 lb 13.5 oz (53 kg), last menstrual period 02/04/2020, SpO2 99%, unknown if currently breastfeeding.  Intake/Output from previous day: 05/05 0701 - 05/06 0700 In: 1200 [IV Piggyback:1200] Out: -   Physical Exam:  HEENT: No thrush, the mucous membranes are moist Lungs: Clear bilaterally Cardiac: Regular rate and rhythm Abdomen: No hepatosplenomegaly, no mass, nontender Extremities: No leg edema, the skin turgor appears normal   Lab Results: Recent Labs    04/07/24 2118 04/08/24 0654  WBC 7.5 3.8*  HGB 14.5 12.4  HCT 44.2 37.0  PLT 196 159    BMET Recent Labs    04/07/24 2118 04/08/24 0654  NA 139 140  K 3.1* 3.7  CL 101 109  CO2 24 24  GLUCOSE 120* 86  BUN 13 12  CREATININE 0.64 0.63  CALCIUM 10.2 9.0    No results found for: "CEA1", "CEA", "VHQ469", "CA125"  Studies/Results: No results found.  Medications: I have  reviewed the patient's current medications.  Assessment/Plan: MALT lymphoma of the stomach 02/05/2023-EGD-2 nonbleeding gastric ulcers in the incisor and gastric antrum, largest measured 10 mm, congestion, discoloration, nodularity, ulceration, and inflammation in the gastric antrum.  Nodularity and congestion in the gastric body and lesser curvature, gastric cardia, fundus, duodenum appeared normal. Pathology from the gastric antrum and "esophagus "revealed benign ulcerative gastritis with an atypical lymphoid infiltrate H. pylori negative consistent with a MALT lymphoma, B lymphocytes CD20 positive, cyclin D1 negative, B-cell gene rearrangement positive 04/09/2023-EGD-no endoscopic evidence of ulceration, post ulcer scarring, nodularity in the gastric body, prepyloric region and gastric antrum-biopsy revealed an atypical lymphoid infiltrate consistent with a lymphoid neoplasm CT abdomen/pelvis 05/25/2023-mild wall thickening at the lesser curvature of the distal gastric body, mild gastropathic ligament lymphadenopathy, soft tissue nodules in the gastrocolic ligament CT abdomen/pelvis 05/31/2023-unchanged distal gastric body wall thickening, gastrohepatic lymph nodes PET 06/11/2023-low-level metabolic activity associated with the gastric antrum, a gastrohepatic ligament lymph node, and a peritoneal nodule, normal spleen Cycle 1 rituximab  06/14/2023 Cycle 2 rituximab  06/22/23 Cycle 3 rituximab  06/28/2023 Cycle 4 rituximab  07/05/2023 CT abdomen/pelvis 11/05/2023: Gastropathic ligament adenopathy decreased in size, diffuse gastric mucosal thickening with an under distended stomach-residual disease not excluded, no new abnormality 01/26/24 - 01/30/24 admission for n/v and pain; CT thickening of the distal gastric body.  EGD by Dr. Kimble Pennant showed a nonobstructing nonbleeding cratered gastric ulcer in the gastric body, path showed atypical lymphoid infiltrate consistent with recurrent MALT lymphoma.   Radiation to the  stomach/perigastric  lymph nodes?  03/03/2024 - 03/27/2024   2.  Nausea/vomiting and abdominal pain secondary to #1 3.  05/22/2023-left index finger ganglion cyst excision, left palmar mass excision-benign epidermal inclusion cyst 4.  Leukocytosis-likely secondary to tobacco use 5.  Tobacco use 6.  06/06/2023-readmission with intractable nausea and vomiting 7.  Refractory nausea and vomiting during/following gastric radiation April/May 2025     [name] completed gastric radiation on 03/27/2024.  She developed nausea and vomiting during radiation.  This has persisted.  The nausea is most likely related to radiation, but other etiologies are possible.  We will be sure H. pylori testing was obtained on the February gastric biopsy. I have a low clinical suspicion for progression of the gastric MALT lymphoma, but this is possible.  We can consider GI consultation if her symptoms persist.  Recommendations: Intravenous hydration Continue Zofran  and lorazepam for nausea Consider GI consult for persistent symptoms    LOS: 0 days   Coni Deep, MD   04/08/2024, 4:23 PM

## 2024-04-08 NOTE — Progress Notes (Signed)
  Carryover admission to the Day Admitter.  I discussed this case with the EDP, Dr. Palumbo.  Per these discussions:   This is a 53 year old female with will lymphoma who just completed a 3-week 3-day course of radiation, who is being admitted with intractable nausea/vomiting and inability to tolerate p.o., after developing nausea/vomiting, without hematemesis, starting 3 to 4 days ago.  She conveys a history of adverse reactions to Phenergan and Reglan , and notes that Zofran  has been ineffective in controlling her nausea/vomiting.  Presenting labs notable for potassium 3.1.  She received a dose of Haldol 2 mg IV x 1, which appears to have helped her nausea/vomiting.  She was also started on normal saline at 125 cc/h.  I have placed an order for observation to med/tele for further evaluation and management of the above.  I have placed some additional preliminary admit orders via the adult multi-morbid admission order set. I have also ordered continuation of aforementioned normal saline at 125 cc/h.  I have added as needed Ativan for nausea/vomiting and ordered potassium chloride  40 mill equivalents IV over 4 hours.  I also ordered morning labs in the form of CMP, CBC, magnesium  level and I have added her outpatient oncologist, Dr. Scherrie Curt, to the treatment team starting at 7 AM on 04/08/2024.     Camelia Cavalier, DO Hospitalist

## 2024-04-09 DIAGNOSIS — D72829 Elevated white blood cell count, unspecified: Secondary | ICD-10-CM | POA: Diagnosis present

## 2024-04-09 DIAGNOSIS — Z859 Personal history of malignant neoplasm, unspecified: Secondary | ICD-10-CM | POA: Diagnosis not present

## 2024-04-09 DIAGNOSIS — Y842 Radiological procedure and radiotherapy as the cause of abnormal reaction of the patient, or of later complication, without mention of misadventure at the time of the procedure: Secondary | ICD-10-CM | POA: Diagnosis present

## 2024-04-09 DIAGNOSIS — R1310 Dysphagia, unspecified: Secondary | ICD-10-CM | POA: Diagnosis present

## 2024-04-09 DIAGNOSIS — Z1152 Encounter for screening for COVID-19: Secondary | ICD-10-CM | POA: Diagnosis not present

## 2024-04-09 DIAGNOSIS — G43909 Migraine, unspecified, not intractable, without status migrainosus: Secondary | ICD-10-CM | POA: Diagnosis present

## 2024-04-09 DIAGNOSIS — F32A Depression, unspecified: Secondary | ICD-10-CM | POA: Diagnosis present

## 2024-04-09 DIAGNOSIS — Z923 Personal history of irradiation: Secondary | ICD-10-CM | POA: Diagnosis not present

## 2024-04-09 DIAGNOSIS — K5900 Constipation, unspecified: Secondary | ICD-10-CM | POA: Diagnosis present

## 2024-04-09 DIAGNOSIS — F17210 Nicotine dependence, cigarettes, uncomplicated: Secondary | ICD-10-CM | POA: Diagnosis present

## 2024-04-09 DIAGNOSIS — Z888 Allergy status to other drugs, medicaments and biological substances status: Secondary | ICD-10-CM | POA: Diagnosis not present

## 2024-04-09 DIAGNOSIS — R739 Hyperglycemia, unspecified: Secondary | ICD-10-CM | POA: Diagnosis not present

## 2024-04-09 DIAGNOSIS — R112 Nausea with vomiting, unspecified: Secondary | ICD-10-CM | POA: Diagnosis present

## 2024-04-09 DIAGNOSIS — K259 Gastric ulcer, unspecified as acute or chronic, without hemorrhage or perforation: Secondary | ICD-10-CM | POA: Diagnosis present

## 2024-04-09 DIAGNOSIS — C884 Extranodal marginal zone b-cell lymphoma of mucosa-associated lymphoid tissue (malt-lymphoma) not having achieved remission: Secondary | ICD-10-CM | POA: Diagnosis present

## 2024-04-09 DIAGNOSIS — C8593 Non-Hodgkin lymphoma, unspecified, intra-abdominal lymph nodes: Secondary | ICD-10-CM | POA: Diagnosis present

## 2024-04-09 DIAGNOSIS — K319 Disease of stomach and duodenum, unspecified: Secondary | ICD-10-CM | POA: Diagnosis present

## 2024-04-09 DIAGNOSIS — R1084 Generalized abdominal pain: Secondary | ICD-10-CM | POA: Diagnosis not present

## 2024-04-09 DIAGNOSIS — R109 Unspecified abdominal pain: Secondary | ICD-10-CM | POA: Diagnosis present

## 2024-04-09 MED ORDER — ONDANSETRON HCL 4 MG/2ML IJ SOLN: 4.0000 mg | Freq: Four times a day (QID) | INTRAMUSCULAR | Status: DC | PRN

## 2024-04-09 MED ORDER — LORAZEPAM 2 MG/ML IJ SOLN
0.5000 mg | INTRAMUSCULAR | Status: DC | PRN
  Administered 2024-04-09: 0.5 mg via INTRAVENOUS
  Filled 2024-04-09: qty 1

## 2024-04-09 MED ORDER — LORAZEPAM 0.5 MG PO TABS: 0.5000 mg | ORAL_TABLET | Freq: Four times a day (QID) | ORAL | Status: DC | PRN

## 2024-04-09 NOTE — Plan of Care (Signed)

## 2024-04-09 NOTE — Hospital Course (Signed)
 Nina Horn is a 53 y.o. female with a history of MALT lymphoma of the stomach.  Patient presented secondary to nausea, vomiting, abdominal pain.  Patient managed symptomatically.  Diet advanced and tolerated.

## 2024-04-09 NOTE — Progress Notes (Signed)
 PROGRESS NOTE    Nina Horn  XLK:[id number] DOB: 1971/02/01 DOA: 04/07/2024 PCP: Dianah Fort, PA   Brief Narrative: Nina Horn is a 53 y.o. female with a history of MALT lymphoma of the stomach.  Patient presented secondary to nausea, vomiting, abdominal pain.  Patient manage symptomatically.  Diet advanced as tolerated.   Assessment and Plan:  Intractable nausea and vomiting Present on admission.  In setting of known MALT lymphoma of the stomach.  Symptoms have significantly improved today.  Patient tolerating a liquid diet. - Advance to soft diet - Continue Zofran  as needed  Abdominal pain In setting of known lymphoma at this time in addition to nausea and vomiting.  Symptoms are improved today.  MALT lymphoma of the stomach Patient follows with medical oncology as an outpatient.   DVT prophylaxis: SCDs Code Status:   Code Status: Full Code Family Communication: None at bedside Disposition Plan: Discharge home in 24 hours if tolerating an oral solid diet   Consultants:  Medical oncology  Procedures:  None  Antimicrobials: None   Subjective: Patient reports improvement of nausea and vomiting.  Hoping to advance diet today.  Objective: BP 115/69 (BP Location: Left Arm)   Pulse 72   Temp 99 F (37.2 C) (Oral)   Resp (!) 22   Ht 5\' 3"  (1.6 m)   Wt 53 kg   LMP 02/04/2020   SpO2 95%   BMI 20.70 kg/m   Examination:  General exam: Appears calm and comfortable Respiratory system: Clear to auscultation. Respiratory effort normal. Cardiovascular system: S1 & S2 heard, RRR. No murmurs, rubs, gallops or clicks. Gastrointestinal system: Abdomen is soft and mildly tender. Normal bowel sounds heard. Central nervous system: Alert and oriented. No focal neurological deficits. Musculoskeletal: No edema. No calf tenderness Psychiatry: Judgement and insight appear normal. Mood & affect appropriate.    Data Reviewed: I have personally reviewed  following labs and imaging studies  CBC Lab Results  Component Value Date   WBC 3.8 (L) 04/08/2024   RBC 3.99 04/08/2024   HGB 12.4 04/08/2024   HCT 37.0 04/08/2024   MCV 92.7 04/08/2024   MCH 31.1 04/08/2024   PLT 159 04/08/2024   MCHC 33.5 04/08/2024   RDW 13.2 04/08/2024   LYMPHSABS 0.5 (L) 04/08/2024   MONOABS 0.8 04/08/2024   EOSABS 0.1 04/08/2024   BASOSABS 0.0 04/08/2024     Last metabolic panel Lab Results  Component Value Date   NA 140 04/08/2024   K 3.7 04/08/2024   CL 109 04/08/2024   CO2 24 04/08/2024   BUN 12 04/08/2024   CREATININE 0.63 04/08/2024   GLUCOSE 86 04/08/2024   GFRNONAA >60 04/08/2024   GFRAA >60 04/05/2020   CALCIUM 9.0 04/08/2024   PHOS 3.2 01/29/2024   PROT 6.1 (L) 04/08/2024   ALBUMIN 3.6 04/08/2024   LABGLOB 3.4 06/01/2023   BILITOT 0.5 04/08/2024   ALKPHOS 52 04/08/2024   AST 22 04/08/2024   ALT 21 04/08/2024   ANIONGAP 7 04/08/2024    GFR: Estimated Creatinine Clearance: 68.8 mL/min (by C-G formula based on SCr of 0.63 mg/dL).  Recent Results (from the past 240 hours)  Resp panel by RT-PCR (RSV, Flu A&B, Covid) Anterior Nasal Swab     Status: None   Collection Time: 04/07/24  9:18 PM   Specimen: Anterior Nasal Swab  Result Value Ref Range Status   SARS Coronavirus 2 by RT PCR NEGATIVE NEGATIVE Final    Comment: (NOTE) SARS-CoV-2 target  nucleic acids are NOT DETECTED.  The SARS-CoV-2 RNA is generally detectable in upper respiratory specimens during the acute phase of infection. The lowest concentration of SARS-CoV-2 viral copies this assay can detect is 138 copies/mL. A negative result does not preclude SARS-Cov-2 infection and should not be used as the sole basis for treatment or other patient management decisions. A negative result may occur with  improper specimen collection/handling, submission of specimen other than nasopharyngeal swab, presence of viral mutation(s) within the areas targeted by this assay, and  inadequate number of viral copies(<138 copies/mL). A negative result must be combined with clinical observations, patient history, and epidemiological information. The expected result is Negative.  Fact Sheet for Patients:  BloggerCourse.com  Fact Sheet for Healthcare Providers:  SeriousBroker.it  This test is no t yet approved or cleared by the [place]  FDA and  has been authorized for detection and/or diagnosis of SARS-CoV-2 by FDA under an Emergency Use Authorization (EUA). This EUA will remain  in effect (meaning this test can be used) for the duration of the COVID-19 declaration under Section 564(b)(1) of the Act, 21 U.S.C.section 360bbb-3(b)(1), unless the authorization is terminated  or revoked sooner.       Influenza A by PCR NEGATIVE NEGATIVE Final   Influenza B by PCR NEGATIVE NEGATIVE Final    Comment: (NOTE) The Xpert Xpress SARS-CoV-2/FLU/RSV plus assay is intended as an aid in the diagnosis of influenza from Nasopharyngeal swab specimens and should not be used as a sole basis for treatment. Nasal washings and aspirates are unacceptable for Xpert Xpress SARS-CoV-2/FLU/RSV testing.  Fact Sheet for Patients: BloggerCourse.com  Fact Sheet for Healthcare Providers: SeriousBroker.it  This test is not yet approved or cleared by the [place]  FDA and has been authorized for detection and/or diagnosis of SARS-CoV-2 by FDA under an Emergency Use Authorization (EUA). This EUA will remain in effect (meaning this test can be used) for the duration of the COVID-19 declaration under Section 564(b)(1) of the Act, 21 U.S.C. section 360bbb-3(b)(1), unless the authorization is terminated or revoked.     Resp Syncytial Virus by PCR NEGATIVE NEGATIVE Final    Comment: (NOTE) Fact Sheet for Patients: BloggerCourse.com  Fact Sheet for Healthcare  Providers: SeriousBroker.it  This test is not yet approved or cleared by the [place]  FDA and has been authorized for detection and/or diagnosis of SARS-CoV-2 by FDA under an Emergency Use Authorization (EUA). This EUA will remain in effect (meaning this test can be used) for the duration of the COVID-19 declaration under Section 564(b)(1) of the Act, 21 U.S.C. section 360bbb-3(b)(1), unless the authorization is terminated or revoked.  Performed at [facility], [address] [place], [place] [address]       Radiology Studies: No results found.    LOS: 0 days    Aneita Keens, MD [facility] 04/09/2024, 2:40 PM   If 7PM-7AM, please contact night-coverage [email or web address]

## 2024-04-09 NOTE — Progress Notes (Addendum)
 IP PROGRESS NOTE  Subjective:   Nina Horn reports no further vomiting since hospital admission.  Nausea has improved.  The bedside RN reports Ativan helped.  She has less trouble with swallowing and thick secretions.  She reports having a loose bowel movement.  No pain. Objective: Vital signs in last 24 hours: Blood pressure 114/88, pulse 73, temperature 97.8 F (36.6 C), temperature source Oral, resp. rate 18, height 5\' 3"  (1.6 m), weight 116 lb 13.5 oz (53 kg), last menstrual period 02/04/2020, SpO2 100%, unknown if currently breastfeeding.  Intake/Output from previous day: 05/06 0701 - 05/07 0700 In: 1884.6 [I.V.:1784.6; IV Piggyback:100] Out: -   Physical Exam:  HEENT: No thrush, the mucous membranes are moist  Abdomen: No hepatosplenomegaly, no mass, nontender Extremities: No leg edema   Lab Results: Recent Labs    04/07/24 2118 04/08/24 0654  WBC 7.5 3.8*  HGB 14.5 12.4  HCT 44.2 37.0  PLT 196 159    BMET Recent Labs    04/07/24 2118 04/08/24 0654  NA 139 140  K 3.1* 3.7  CL 101 109  CO2 24 24  GLUCOSE 120* 86  BUN 13 12  CREATININE 0.64 0.63  CALCIUM 10.2 9.0    No results found for: "CEA1", "CEA", "MWN027", "CA125"  Studies/Results: No results found.  Medications: I have reviewed the patient's current medications.  Assessment/Plan: MALT lymphoma of the stomach 02/05/2023-EGD-2 nonbleeding gastric ulcers in the incisor and gastric antrum, largest measured 10 mm, congestion, discoloration, nodularity, ulceration, and inflammation in the gastric antrum.  Nodularity and congestion in the gastric body and lesser curvature, gastric cardia, fundus, duodenum appeared normal. Pathology from the gastric antrum and "esophagus "revealed benign ulcerative gastritis with an atypical lymphoid infiltrate H. pylori negative consistent with a MALT lymphoma, B lymphocytes CD20 positive, cyclin D1 negative, B-cell gene rearrangement positive 04/09/2023-EGD-no  endoscopic evidence of ulceration, post ulcer scarring, nodularity in the gastric body, prepyloric region and gastric antrum-biopsy revealed an atypical lymphoid infiltrate consistent with a lymphoid neoplasm CT abdomen/pelvis 05/25/2023-mild wall thickening at the lesser curvature of the distal gastric body, mild gastropathic ligament lymphadenopathy, soft tissue nodules in the gastrocolic ligament CT abdomen/pelvis 05/31/2023-unchanged distal gastric body wall thickening, gastrohepatic lymph nodes PET 06/11/2023-low-level metabolic activity associated with the gastric antrum, a gastrohepatic ligament lymph node, and a peritoneal nodule, normal spleen Cycle 1 rituximab  06/14/2023 Cycle 2 rituximab  06/22/23 Cycle 3 rituximab  06/28/2023 Cycle 4 rituximab  07/05/2023 CT abdomen/pelvis 11/05/2023: Gastropathic ligament adenopathy decreased in size, diffuse gastric mucosal thickening with an under distended stomach-residual disease not excluded, no new abnormality 01/26/24 - 01/30/24 admission for n/v and pain; CT thickening of the distal gastric body.  EGD by Dr. Kimble Pennant showed a nonobstructing nonbleeding cratered gastric ulcer in the gastric body, path showed atypical lymphoid infiltrate consistent with recurrent MALT lymphoma.   Radiation to the stomach/perigastric lymph nodes?  03/03/2024 - 03/27/2024   2.  Nausea/vomiting and abdominal pain secondary to #1 3.  05/22/2023-left index finger ganglion cyst excision, left palmar mass excision-benign epidermal inclusion cyst 4.  Leukocytosis-likely secondary to tobacco use 5.  Tobacco use 6.  06/06/2023-readmission with intractable nausea and vomiting 7.  Refractory nausea and vomiting during/following gastric radiation April/May 2025     Nina Horn completed gastric radiation on 03/27/2024.  She developed nausea and vomiting during radiation.  This has persisted.  The nausea is most likely related to radiation, but other etiologies are possible.  We will be sure H.  pylori testing was obtained on the  February gastric biopsy. I have a low clinical suspicion for progression of the gastric MALT lymphoma, but this is possible.  We can consider GI consultation if her symptoms persist.  The nausea appears improved today.  I recommend advancing her diet as tolerated and continuing lorazepam as needed for nausea.  I will add sublingual lorazepam.  She will be stable for discharge if her symptoms remain improved tomorrow. Recommendations: Intravenous hydration Add sublingual lorazepam as needed for nausea Advance to full liquid diet Continue scopolamine  patch-may be contributing to the thickened secretions    LOS: 0 days   Coni Deep, MD   04/09/2024, 10:25 AM

## 2024-04-09 NOTE — Plan of Care (Signed)

## 2024-04-10 ENCOUNTER — Encounter: Payer: Self-pay | Admitting: *Deleted

## 2024-04-10 ENCOUNTER — Emergency Department (HOSPITAL_COMMUNITY)
Admission: EM | Admit: 2024-04-10 | Discharge: 2024-04-11 | Disposition: A | Discharge status: Home / Self Care | Attending: Emergency Medicine | Admitting: Emergency Medicine

## 2024-04-10 ENCOUNTER — Emergency Department (HOSPITAL_COMMUNITY)

## 2024-04-10 DIAGNOSIS — R1084 Generalized abdominal pain: Secondary | ICD-10-CM | POA: Diagnosis not present

## 2024-04-10 DIAGNOSIS — R112 Nausea with vomiting, unspecified: Secondary | ICD-10-CM | POA: Insufficient documentation

## 2024-04-10 DIAGNOSIS — R739 Hyperglycemia, unspecified: Secondary | ICD-10-CM | POA: Insufficient documentation

## 2024-04-10 DIAGNOSIS — R109 Unspecified abdominal pain: Secondary | ICD-10-CM | POA: Diagnosis present

## 2024-04-10 DIAGNOSIS — Z859 Personal history of malignant neoplasm, unspecified: Secondary | ICD-10-CM | POA: Diagnosis not present

## 2024-04-10 LAB — LIPASE, BLOOD: Lipase: 25 U/L (ref 11–51)

## 2024-04-10 LAB — COMPREHENSIVE METABOLIC PANEL WITH GFR
ALT: 26 U/L (ref 0–44)
AST: 28 U/L (ref 15–41)
Albumin: 4.4 g/dL (ref 3.5–5.0)
Alkaline Phosphatase: 66 U/L (ref 38–126)
Anion gap: 11 (ref 5–15)
BUN: 6 mg/dL (ref 6–20)
CO2: 24 mmol/L (ref 22–32)
Calcium: 9.5 mg/dL (ref 8.9–10.3)
Chloride: 104 mmol/L (ref 98–111)
Creatinine, Ser: 0.67 mg/dL (ref 0.44–1.00)
GFR, Estimated: >60 mL/min (ref >60)
Glucose, Bld: 109 mg/dL — ABNORMAL HIGH (ref 70–99)
Potassium: 3.5 mmol/L (ref 3.5–5.1)
Sodium: 139 mmol/L (ref 135–145)
Total Bilirubin: 0.6 mg/dL (ref 0.0–1.2)
Total Protein: 7.4 g/dL (ref 6.5–8.1)

## 2024-04-10 LAB — CBC
HCT: 39.7 % (ref 36.0–46.0)
Hemoglobin: 13.3 g/dL (ref 12.0–15.0)
MCH: 30.9 pg (ref 26.0–34.0)
MCHC: 33.5 g/dL (ref 30.0–36.0)
MCV: 92.1 fL (ref 80.0–100.0)
Platelets: 165 10*3/uL (ref 150–400)
RBC: 4.31 MIL/uL (ref 3.87–5.11)
RDW: 12.9 % (ref 11.5–15.5)
WBC: 4.8 10*3/uL (ref 4.0–10.5)
nRBC: 0 % (ref 0.0–0.2)

## 2024-04-10 MED ORDER — DIPHENHYDRAMINE HCL 50 MG/ML IJ SOLN
25.0000 mg | Freq: Once | INTRAMUSCULAR | Status: AC
  Administered 2024-04-10: 25 mg via INTRAVENOUS
  Filled 2024-04-10: qty 1

## 2024-04-10 MED ORDER — LORAZEPAM 0.5 MG PO TABS: 0.5000 mg | ORAL_TABLET | Freq: Two times a day (BID) | ORAL | 0 refills | Status: DC | PRN

## 2024-04-10 MED ORDER — IOHEXOL 300 MG/ML  SOLN
100.0000 mL | Freq: Once | INTRAMUSCULAR | Status: AC | PRN
  Administered 2024-04-10: 80 mL via INTRAVENOUS

## 2024-04-10 MED ORDER — LACTATED RINGERS IV BOLUS
1000.0000 mL | Freq: Once | INTRAVENOUS | Status: AC
  Administered 2024-04-10: 1000 mL via INTRAVENOUS

## 2024-04-10 MED ORDER — PROCHLORPERAZINE EDISYLATE 10 MG/2ML IJ SOLN
10.0000 mg | Freq: Once | INTRAMUSCULAR | Status: AC
  Administered 2024-04-10: 10 mg via INTRAVENOUS
  Filled 2024-04-10: qty 2

## 2024-04-10 MED ORDER — LORAZEPAM 2 MG/ML IJ SOLN
0.5000 mg | Freq: Once | INTRAMUSCULAR | Status: AC
  Administered 2024-04-10: 0.5 mg via INTRAVENOUS
  Filled 2024-04-10: qty 1

## 2024-04-10 NOTE — Discharge Instructions (Signed)
 Nina Horn,  You were in the hospital with nausea/vomiting which may have been related to a medication effect. You have improved symptoms. Please follow-up with your PCP and oncologist.

## 2024-04-10 NOTE — ED Provider Notes (Signed)
 Care assumed from Dr. Tamela Fake, patient with ongoing nausea/vomiting, new onset of headache. Labs are reassuring. CT scans of head, abdomen/pelvis are pending. May need to be admitted if nausea is not able to be controlled.  Patient had partial improvement with initial treatment in the emergency department.  I have given her additional ondansetron  and morphine  and she is feeling significantly better and feels that she can adequately manage her symptoms at home.  I am discharging her with prescriptions for ondansetron  oral dissolving tablet, oral prochlorperazine , oxycodone .  I have advised her to return to the emergency department if symptoms are not being adequately controlled at home.   Alissa April, MD 04/11/24 [phone]

## 2024-04-10 NOTE — Plan of Care (Signed)

## 2024-04-10 NOTE — Progress Notes (Signed)
 Per Dr. Scherrie Curt: Cancel f/u on 5/9 and reschedule for 2-3 weeks (no lab). Scheduling message sent.

## 2024-04-10 NOTE — Discharge Summary (Signed)
 Physician Discharge Summary   Patient: Nina Horn MRN: [id number] DOB: [birth year]  Admit date:     04/07/2024  Discharge date: 04/10/24  Discharge Physician: Aneita Keens, MD   PCP: Dianah Fort, PA   Recommendations at discharge:  PCP visit for hospital follow-up  Discharge Diagnoses: Principal Problem:   Intractable nausea and vomiting Active Problems:   MALT lymphoma   Gastric lymphoma (HCC)   Diffuse abdominal pain  Resolved Problems:   * No resolved hospital problems. *  Hospital Course: Nina Horn is a 53 y.o. female with a history of MALT lymphoma of the stomach.  Patient presented secondary to nausea, vomiting, abdominal pain.  Patient managed symptomatically.  Diet advanced and tolerated.  Assessment and Plan:  Intractable nausea and vomiting Present on admission.  In setting of known MALT lymphoma of the stomach.  Symptoms have significantly improved; possibly related to discontinuing scopolamine  patch.  Patient tolerating a regular diet prior to discharge.   Abdominal pain In setting of known lymphoma at this time in addition to nausea and vomiting.  Symptoms are improved today.   MALT lymphoma of the stomach Patient follows with medical oncology as an outpatient.   Consultants:  Medical oncology   Procedures:  None  Disposition: Home Diet recommendation: Regular diet   DISCHARGE MEDICATION: Allergies as of 04/10/2024       Reactions   Promethazine Hcl Other (See Comments)   delirium   Reglan  [metoclopramide ] Other (See Comments)   Delirium   Rituximab -pvvr Nausea And Vomiting   Patient had hypersensitivity reaction to Rituxan . See progress note from 06/14/2023 at 1231. Patient able to complete infusion.        Medication List     STOP taking these medications    lidocaine  2 % solution Commonly known as: XYLOCAINE    ondansetron  4 MG tablet Commonly known as: ZOFRAN    scopolamine  1 MG/3DAYS Commonly known as:  TRANSDERM-SCOP       TAKE these medications    acetaminophen  500 MG tablet Commonly known as: TYLENOL  Take 500-1,000 mg by mouth every 6 (six) hours as needed for moderate pain (pain score 4-6).   LORazepam 0.5 MG tablet Commonly known as: Ativan Take 1 tablet (0.5 mg total) by mouth 2 (two) times daily as needed (nausea/vomiting).   ondansetron  8 MG disintegrating tablet Commonly known as: ZOFRAN -ODT Take 1 tablet (8 mg total) by mouth every 8 (eight) hours as needed for nausea or vomiting.   oxyCODONE  5 MG immediate release tablet Commonly known as: Oxy IR/ROXICODONE  Take 1 tablet (5 mg total) by mouth every 6 (six) hours as needed.   pantoprazole  40 MG tablet Commonly known as: PROTONIX  Take 1 tablet (40 mg total) by mouth 2 (two) times daily before a meal.   PEG 3350  17 g Pack Take 17 g by mouth daily as needed for moderate constipation or severe constipation.   Stool Softener/Laxative 50-8.6 MG tablet Generic drug: senna-docusate Take 1 tablet by mouth at bedtime as needed for mild constipation.   sucralfate  1 g tablet Commonly known as: CARAFATE  Take 1 g by mouth in the morning, at noon, in the evening, and at bedtime.        Follow-up Information     Dianah Fort, [place]. Schedule an appointment as soon as possible for a visit in 1 week(s).   Specialty: Physician Assistant Why: For hospital follow-up, As needed Contact information: [address] [place] [address] [phone]  Discharge Exam: BP 130/80 (BP Location: Right Arm)   Pulse 70   Temp 98.9 F (37.2 C) (Oral)   Resp 16   Ht 5\' 3"  (1.6 m)   Wt 53 kg   LMP 02/04/2020   SpO2 98%   BMI 20.70 kg/m   General exam: Appears calm and comfortable Respiratory system: Respiratory effort normal. Central nervous system: Alert and oriented. No focal neurological deficits. Psychiatry: Judgement and insight appear normal. Mood & affect appropriate.   Condition at  discharge: stable  The results of significant diagnostics from this hospitalization (including imaging, microbiology, ancillary and laboratory) are listed below for reference.   Imaging Studies: CT Soft Tissue Neck W Contrast Result Date: 03/21/2024 CLINICAL DATA:  Occult malignancy Patient with diagnosis of lymphoma now has a fullness feeling in her throat. EXAM: CT NECK WITH CONTRAST TECHNIQUE: Multidetector CT imaging of the neck was performed using the standard protocol following the bolus administration of intravenous contrast. RADIATION DOSE REDUCTION: This exam was performed according to the departmental dose-optimization program which includes automated exposure control, adjustment of the mA and/or kV according to patient size and/or use of iterative reconstruction technique. CONTRAST:  75mL OMNIPAQUE  IOHEXOL  300 MG/ML  SOLN COMPARISON:  PET CT March 12, 25.  At FINDINGS: Pharynx and larynx: Significantly limited evaluation of the pharynx due to significant motion. Salivary glands: No inflammation, mass, or stone. Thyroid : Normal. Lymph nodes: No visible lymphadenopathy. Limited evaluation at the skull base due to motion. Vascular: Negative. Limited intracranial: Negative. Visualized orbits: Negative. Mastoids and visualized paranasal sinuses: Clear. Skeleton: No acute or aggressive process. Upper chest: Visualized lung apices are clear. IMPRESSION: Significantly limited evaluation of the pharynx and skull base due to motion. Within this limitation, no visible mass or lymphadenopathy. Electronically Signed   By: Stevenson Elbe M.D.   On: 03/21/2024 00:57   DG Chest Port 1 View Result Date: 03/20/2024 CLINICAL DATA:  Upper chest discomfort patient receiving radiation to epigastric part of the abdomen. EXAM: PORTABLE CHEST - 1 VIEW COMPARISON:  10/13/2018. FINDINGS: Cardiac silhouette is unremarkable. No pneumothorax or pleural effusion. The lungs are clear. The visualized skeletal structures are  unremarkable. IMPRESSION: No acute cardiopulmonary process. Electronically Signed   By: Sydell Eva M.D.   On: 03/20/2024 21:15    Microbiology: Results for orders placed or performed during the hospital encounter of 04/07/24  Resp panel by RT-PCR (RSV, Flu A&B, Covid) Anterior Nasal Swab     Status: None   Collection Time: 04/07/24  9:18 PM   Specimen: Anterior Nasal Swab  Result Value Ref Range Status   SARS Coronavirus 2 by RT PCR NEGATIVE NEGATIVE Final    Comment: (NOTE) SARS-CoV-2 target nucleic acids are NOT DETECTED.  The SARS-CoV-2 RNA is generally detectable in upper respiratory specimens during the acute phase of infection. The lowest concentration of SARS-CoV-2 viral copies this assay can detect is 138 copies/mL. A negative result does not preclude SARS-Cov-2 infection and should not be used as the sole basis for treatment or other patient management decisions. A negative result may occur with  improper specimen collection/handling, submission of specimen other than nasopharyngeal swab, presence of viral mutation(s) within the areas targeted by this assay, and inadequate number of viral copies(<138 copies/mL). A negative result must be combined with clinical observations, patient history, and epidemiological information. The expected result is Negative.  Fact Sheet for Patients:  BloggerCourse.com  Fact Sheet for Healthcare Providers:  SeriousBroker.it  This test is no t yet approved or  cleared by the [place]  FDA and  has been authorized for detection and/or diagnosis of SARS-CoV-2 by FDA under an Emergency Use Authorization (EUA). This EUA will remain  in effect (meaning this test can be used) for the duration of the COVID-19 declaration under Section 564(b)(1) of the Act, 21 U.S.C.section 360bbb-3(b)(1), unless the authorization is terminated  or revoked sooner.       Influenza A by PCR NEGATIVE  NEGATIVE Final   Influenza B by PCR NEGATIVE NEGATIVE Final    Comment: (NOTE) The Xpert Xpress SARS-CoV-2/FLU/RSV plus assay is intended as an aid in the diagnosis of influenza from Nasopharyngeal swab specimens and should not be used as a sole basis for treatment. Nasal washings and aspirates are unacceptable for Xpert Xpress SARS-CoV-2/FLU/RSV testing.  Fact Sheet for Patients: BloggerCourse.com  Fact Sheet for Healthcare Providers: SeriousBroker.it  This test is not yet approved or cleared by the [place]  FDA and has been authorized for detection and/or diagnosis of SARS-CoV-2 by FDA under an Emergency Use Authorization (EUA). This EUA will remain in effect (meaning this test can be used) for the duration of the COVID-19 declaration under Section 564(b)(1) of the Act, 21 U.S.C. section 360bbb-3(b)(1), unless the authorization is terminated or revoked.     Resp Syncytial Virus by PCR NEGATIVE NEGATIVE Final    Comment: (NOTE) Fact Sheet for Patients: BloggerCourse.com  Fact Sheet for Healthcare Providers: SeriousBroker.it  This test is not yet approved or cleared by the [place]  FDA and has been authorized for detection and/or diagnosis of SARS-CoV-2 by FDA under an Emergency Use Authorization (EUA). This EUA will remain in effect (meaning this test can be used) for the duration of the COVID-19 declaration under Section 564(b)(1) of the Act, 21 U.S.C. section 360bbb-3(b)(1), unless the authorization is terminated or revoked.  Performed at [facility], [address] [place], [place] [address]     Labs: CBC: Recent Labs  Lab 04/07/24 2118 04/08/24 0654  WBC 7.5 3.8*  NEUTROABS 6.0 2.4  HGB 14.5 12.4  HCT 44.2 37.0  MCV 92.7 92.7  PLT 196 159   Basic Metabolic Panel: Recent Labs  Lab 04/07/24 2118 04/08/24 0654  NA 139 140  K  3.1* 3.7  CL 101 109  CO2 24 24  GLUCOSE 120* 86  BUN 13 12  CREATININE 0.64 0.63  CALCIUM 10.2 9.0  MG 2.1 2.0   Liver Function Tests: Recent Labs  Lab 04/07/24 2118 04/08/24 0654  AST 28 22  ALT 26 21  ALKPHOS 65 52  BILITOT 0.4 0.5  PROT 8.5* 6.1*  ALBUMIN 4.8 3.6    Discharge time spent: 35 minutes.  Signed: Aneita Keens, MD [facility] 04/10/2024

## 2024-04-10 NOTE — ED Provider Notes (Signed)
 [place] EMERGENCY DEPARTMENT AT [facility] Provider Note   CSN: [id number] Arrival date & time: 04/10/24  1914     History  No chief complaint on file.   Nina Horn is a 53 y.o. female.  HPI     53 year old female with a history of MALT lymphoma of the stomach, depression, migraines, recent admission with discharge today with nausea and vomiting who presents again with nausea, vomiting, abdominal pain and development of headache.  Initially felt better prior to leaving hospital Got home, then started having nausea, vomiting, just gagging nothing coming up Took 4mg  zofran  but then developed headache Abdominal pain feels similar to what she had felt before, muscle spasms, tightening up severely, top of abdomen.  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 Authorizing Provider  acetaminophen  (TYLENOL ) 500 MG tablet Take 500-1,000 mg by mouth every 6 (six) hours as needed for moderate pain (pain score 4-6).   Yes [provider]  pantoprazole  (PROTONIX ) 40 MG tablet Take 1 tablet (40 mg total) by mouth 2 (two) times daily before a meal. 03/27/24 05/26/24 Yes [name], MD  polyethylene glycol (MIRALAX  / GLYCOLAX ) 17 g packet Take 17 g by mouth daily as needed for moderate constipation or severe constipation. 01/30/24  Yes [name], [name], MD  prochlorperazine  (COMPAZINE ) 10 MG tablet Take 1 tablet (10 mg total) by mouth every 6 (six) hours as needed for nausea or vomiting. 04/11/24  Yes Alissa April, MD  senna-docusate (SENOKOT-S) 8.6-50 MG  tablet Take 1 tablet by mouth at bedtime as needed for mild constipation. 01/30/24  Yes [name], [name], MD  sucralfate  (CARAFATE ) 1 g tablet Take 1 g by mouth in the morning, at noon, in the evening, and at bedtime.   Yes [provider]  LORazepam  (ATIVAN ) 0.5 MG tablet Take 1 tablet (0.5 mg total) by mouth 2 (two) times daily as needed (nausea/vomiting). 04/10/24   Verlyn Goad, MD  ondansetron  (ZOFRAN -ODT) 8 MG disintegrating tablet Take 1 tablet (8 mg total) by mouth every 8 (eight) hours as needed for nausea or vomiting. 04/11/24   Alissa April, MD  oxyCODONE  (OXY IR/ROXICODONE ) 5 MG immediate release tablet Take 1-2 tablets (5-10 mg total) by mouth every 6 (six) hours as needed. 04/11/24   Alissa April, MD  ranitidine  (ZANTAC ) 150 MG tablet Take 1 tablet (150 mg total) by mouth 2 (two) times daily. Patient not taking: Reported on 10/13/2018 07/27/18 04/05/20  Emmet Harm, MD      Allergies    Promethazine hcl, Reglan  [metoclopramide ], and Rituximab -pvvr    Review of Systems   Review of Systems  Physical Exam Updated Vital Signs BP 104/63   Pulse 69   Temp 98 F (36.7 C) (Oral)   Resp 18   LMP 02/04/2020   SpO2 100%  Physical Exam Vitals and nursing note reviewed.  Constitutional:      General: She is in acute distress (pain, dry heaves).     Appearance: Normal appearance. She is well-developed. She is not ill-appearing or  diaphoretic.  HENT:     Head: Normocephalic and atraumatic.  Eyes:     General: No visual field deficit.    Extraocular Movements: Extraocular movements intact.     Conjunctiva/sclera: Conjunctivae normal.     Pupils: Pupils are equal, round, and reactive to light.  Cardiovascular:     Rate and Rhythm: Normal rate and regular rhythm.     Pulses: Normal pulses.     Heart sounds: Normal heart sounds. No murmur heard.    No friction rub. No gallop.  Pulmonary:     Effort: Pulmonary effort is normal. No respiratory distress.     Breath sounds: Normal  breath sounds. No wheezing or rales.  Abdominal:     General: There is no distension.     Palpations: Abdomen is soft.     Tenderness: There is abdominal tenderness (diffuse). There is no guarding.  Musculoskeletal:        General: No swelling or tenderness.     Cervical back: Normal range of motion.  Skin:    General: Skin is warm and dry.     Findings: No erythema or rash.  Neurological:     General: No focal deficit present.     Mental Status: She is alert and oriented to person, place, and time.     GCS: GCS eye subscore is 4. GCS verbal subscore is 5. GCS motor subscore is 6.     Cranial Nerves: No cranial nerve deficit, dysarthria or facial asymmetry.     Sensory: No sensory deficit.     Motor: No weakness or tremor.     Coordination: Coordination normal. Finger-Nose-Finger Test normal.     ED Results / Procedures / Treatments   Labs (all labs ordered are listed, but only abnormal results are displayed) Labs Reviewed  COMPREHENSIVE METABOLIC PANEL WITH GFR - Abnormal; Notable for the following components:      Result Value   Glucose, Bld 109 (*)    All other components within normal limits  CBC  LIPASE, BLOOD  URINALYSIS, W/ REFLEX TO CULTURE (INFECTION SUSPECTED)    EKG None  Radiology CT ABDOMEN PELVIS W CONTRAST Result Date: 04/11/2024 CLINICAL DATA:  Acute abdominal pain with nausea and vomiting, history of lymphoma EXAM: CT ABDOMEN AND PELVIS WITH CONTRAST TECHNIQUE: Multidetector CT imaging of the abdomen and pelvis was performed using the standard protocol following bolus administration of intravenous contrast. RADIATION DOSE REDUCTION: This exam was performed according to the departmental dose-optimization program which includes automated exposure control, adjustment of the mA and/or kV according to patient size and/or use of iterative reconstruction technique. CONTRAST:  80mL OMNIPAQUE  IOHEXOL  300 MG/ML  SOLN COMPARISON:  PET-CT from 02/13/2024 FINDINGS: Lower  chest: No acute abnormality. Hepatobiliary: No focal liver abnormality is seen. No gallstones, gallbladder wall thickening, or biliary dilatation. Pancreas: Unremarkable. No pancreatic ductal dilatation or surrounding inflammatory changes. Spleen: Normal in size without focal abnormality. Adrenals/Urinary Tract: Adrenal glands are within normal limits. Kidneys demonstrate a normal enhancement pattern bilaterally. No renal calculi or obstructive changes are seen. The bladder is partially distended. Stomach/Bowel: No obstructive or inflammatory changes of the colon are seen. The appendix is not well visualized although no inflammatory changes to suggest appendicitis are noted. Small bowel and stomach are within normal limits. Vascular/Lymphatic: Calcifications are noted within the abdominal aorta. Previously seen omental nodule in right gastric node are less well visualized on the current exam. No new adenopathy is seen. Reproductive: Uterus and bilateral adnexa are unremarkable. Other:  No abdominal wall hernia or abnormality. No abdominopelvic ascites. Musculoskeletal: No acute or significant osseous findings. IMPRESSION: No acute abnormality noted. Electronically Signed   By: Violeta Grey M.D.   On: 04/11/2024 00:13   CT Head Wo Contrast Result Date: 04/11/2024 CLINICAL DATA:  Headaches EXAM: CT HEAD WITHOUT CONTRAST TECHNIQUE: Contiguous axial images were obtained from the base of the skull through the vertex without intravenous contrast. RADIATION DOSE REDUCTION: This exam was performed according to the departmental dose-optimization program which includes automated exposure control, adjustment of the mA and/or kV according to patient size and/or use of iterative reconstruction technique. COMPARISON:  12/08/2012 FINDINGS: Brain: No evidence of acute infarction, hemorrhage, hydrocephalus, extra-axial collection or mass lesion/mass effect. Vascular: No hyperdense vessel or unexpected calcification. Skull: Normal.  Negative for fracture or focal lesion. Sinuses/Orbits: No acute finding. Other: None. IMPRESSION: No acute intracranial abnormality noted. Electronically Signed   By: Violeta Grey M.D.   On: 04/11/2024 00:09    Procedures Procedures    Medications Ordered in ED Medications  lactated ringers  bolus 1,000 mL (0 mLs Intravenous Stopped 04/11/24 0402)  prochlorperazine  (COMPAZINE ) injection 10 mg (10 mg Intravenous Given 04/10/24 2321)  diphenhydrAMINE  (BENADRYL ) injection 25 mg (25 mg Intravenous Given 04/10/24 2319)  LORazepam  (ATIVAN ) injection 0.5 mg (0.5 mg Intravenous Given 04/10/24 2321)  iohexol  (OMNIPAQUE ) 300 MG/ML solution 100 mL (80 mLs Intravenous Contrast Given 04/10/24 2335)  morphine  (PF) 4 MG/ML injection 4 mg (4 mg Intravenous Given 04/11/24 0224)  ondansetron  (ZOFRAN ) injection 4 mg (4 mg Intravenous Given 04/11/24 0224)    ED Course/ Medical Decision Making/ A&P                                  53 year old female with a history of MALT lymphoma of the stomach, depression, migraines, recent admission with discharge today with nausea and vomiting who presents again with nausea, vomiting, abdominal pain and development of headache.  DDx includes SBO, chemo/radiation side effections, other gastritis, cholecystitis, intracranial etiology of symptoms.  Labs completed and evaluated by me without clinically significant abnormalities, no sign of pancreatitis, nor UTI, no hepatitis or clinically significant electrolyte abnormalities.  CT head ordered given headache and persistent vomiting to evaluate for intracranial abnormality, ICH.  CT abdomen ordered given persistent n/v to evalute for obstruction or other acute abnormality  Give compazine , benadryl , ativan  and IV fluids and signed out to Dr. Candelaria Chaco with CT and reevaluation pending.   cument critical care time when appropriate:1}      Final Clinical Impression(s) / ED Diagnoses Final diagnoses:  Abdominal pain, generalized  Nausea  and vomiting, unspecified vomiting type  Elevated random blood glucose level    Rx / DC Orders ED Discharge Orders          Ordered    oxyCODONE  (OXY IR/ROXICODONE ) 5 MG immediate release tablet  Every 6 hours PRN        04/11/24 0347    ondansetron  (ZOFRAN -ODT) 8 MG disintegrating tablet  Every 8 hours PRN        04/11/24 0347    prochlorperazine  (COMPAZINE ) 10 MG tablet  Every 6 hours PRN        04/11/24 0347              Scarlette Currier, MD 04/11/24 1217

## 2024-04-10 NOTE — ED Triage Notes (Signed)
 Patient complains of nausea, vomiting, abdominal pain, and headache. States she was just discharged this am from [facility]. Patient states she took Zofran  at home today and she has been having a severe headache since and the N/V and pain has gotten worse with no relief. Patient has Malt Lymphoma. Rate pain 10+/10.

## 2024-04-11 ENCOUNTER — Inpatient Hospital Stay: Admitting: Oncology

## 2024-04-11 ENCOUNTER — Inpatient Hospital Stay

## 2024-04-11 LAB — URINALYSIS, W/ REFLEX TO CULTURE (INFECTION SUSPECTED)
Bacteria, UA: NONE SEEN
Bilirubin Urine: NEGATIVE
Glucose, UA: NEGATIVE mg/dL
Hgb urine dipstick: NEGATIVE
Ketones, ur: NEGATIVE mg/dL
Leukocytes,Ua: NEGATIVE
Nitrite: NEGATIVE
Protein, ur: NEGATIVE mg/dL
Specific Gravity, Urine: 1.025 (ref 1.005–1.030)
pH: 6 (ref 5.0–8.0)

## 2024-04-11 MED ORDER — ONDANSETRON HCL 4 MG/2ML IJ SOLN
4.0000 mg | Freq: Once | INTRAMUSCULAR | Status: AC
  Administered 2024-04-11: 4 mg via INTRAVENOUS
  Filled 2024-04-11: qty 2

## 2024-04-11 MED ORDER — OXYCODONE HCL 5 MG PO TABS: 5.0000 mg | ORAL_TABLET | Freq: Four times a day (QID) | ORAL | 0 refills | Status: AC | PRN

## 2024-04-11 MED ORDER — MORPHINE SULFATE (PF) 4 MG/ML IV SOLN
4.0000 mg | Freq: Once | INTRAVENOUS | Status: AC
  Administered 2024-04-11: 4 mg via INTRAVENOUS
  Filled 2024-04-11: qty 1

## 2024-04-11 MED ORDER — PROCHLORPERAZINE MALEATE 10 MG PO TABS: 10.0000 mg | ORAL_TABLET | Freq: Four times a day (QID) | ORAL | 0 refills | Status: AC | PRN

## 2024-04-11 MED ORDER — ONDANSETRON 8 MG PO TBDP: 8.0000 mg | ORAL_TABLET | Freq: Three times a day (TID) | ORAL | 1 refills | Status: AC | PRN

## 2024-04-11 NOTE — Discharge Instructions (Addendum)
 You may take acetaminophen  as needed for less severe pain.  For pain not controlled by acetaminophen  alone, you may add oxycodone .  When you combine oxycodone  with acetaminophen , you get better pain relief and you get from taking either medication by itself.  Return to the emergency department if pain or nausea are not being adequately controlled at home.

## 2024-04-12 ENCOUNTER — Emergency Department (HOSPITAL_COMMUNITY)
Admission: EM | Admit: 2024-04-12 | Discharge: 2024-04-12 | Disposition: A | Discharge status: Home / Self Care | Attending: Emergency Medicine | Admitting: Emergency Medicine

## 2024-04-12 DIAGNOSIS — C8599 Non-Hodgkin lymphoma, unspecified, extranodal and solid organ sites: Secondary | ICD-10-CM | POA: Diagnosis present

## 2024-04-12 DIAGNOSIS — C8590 Non-Hodgkin lymphoma, unspecified, unspecified site: Secondary | ICD-10-CM

## 2024-04-12 DIAGNOSIS — R110 Nausea: Secondary | ICD-10-CM | POA: Insufficient documentation

## 2024-04-12 NOTE — ED Notes (Signed)
 Pt stated last time of taking zofran  was Thursday, and she has not take ativan  or Oxycotin in the past week.

## 2024-04-12 NOTE — ED Notes (Signed)
 Discharge instructions talked through with pt. Pt and significant other asked questions, and questions were answered.

## 2024-04-12 NOTE — ED Triage Notes (Signed)
 Coming from home, pt was discharged from [facility] on Thursday. Pt was discharged with prescription of Ativan  and Oxycotin. Pt was afraid of taking the two drugs combined due compound effect. However, pt still having  nausea. "I'm afraid having to be given narcan ". Pt has Hx of Malt Lymphona.  States pain being manageable at the moment, however, nausea still being a problem.  Aox4, anxious at this moment

## 2024-04-12 NOTE — ED Provider Notes (Signed)
 [place] EMERGENCY DEPARTMENT AT [facility] Provider Note   CSN: [id number] Arrival date & time: 04/12/24  1949     History  Chief Complaint  Patient presents with   Nausea    Nina Horn is a 53 y.o. female.  Patient was hospitalized May 5 through May 8.  Patient has the diagnosis of MALT lymphoma and gastric lymphoma.  Patient has difficulty with nausea vomiting and some diffuse abdominal pain.  During that hospitalization they were able to get things under better control and patient was doing much better she was discharged home on lorazepam  0.5 mg twice a day Zofran  dissolvable at 8 mg every 8 hours oxycodone  5 mg 1 tablet every 6 hours.  And she was continued on her Protonix  and the sulcal fate.  After being discharged on the eighth patient was seen in the emergency department and had head CT abdominal CT had her pain under control nausea under control was here for through the eighth to the ninth and then was discharged.  Same recommendation for same medicines.  But patient at that time was recommended on the Zofran  4 mg because she felt a little more comfortable with that.  That is also ODT.  Patient has been afraid to take these medicines because her pharmacist told her that she could need Narcan  or something.  But these doses are very low.  Because of that patient has not taken them.  Patient has not been really suffering much from abdominal pain she says she is able to handle that.  Still taking the main medication based on that is optional.  Patient has had some nausea but no persistent vomiting.  Her mucous membranes are very moist she seems well-hydrated.  She just was concerned about the medications and needed an additional opinion.  Past medical history significant for migraines depression in the MALT lymphoma think the gastric cancer.  Patient is an everyday smoker.         Home Medications Prior to Admission medications   Medication Sig Start Date  End Date Taking? Authorizing Provider  acetaminophen  (TYLENOL ) 500 MG tablet Take 500-1,000 mg by mouth every 6 (six) hours as needed for moderate pain (pain score 4-6).    [provider]  LORazepam  (ATIVAN ) 0.5 MG tablet Take 1 tablet (0.5 mg total) by mouth 2 (two) times daily as needed (nausea/vomiting). 04/10/24   Verlyn Goad, MD  ondansetron  (ZOFRAN -ODT) 8 MG disintegrating tablet Take 1 tablet (8 mg total) by mouth every 8 (eight) hours as needed for nausea or vomiting. 04/11/24   Alissa April, MD  oxyCODONE  (OXY IR/ROXICODONE ) 5 MG immediate release tablet Take 1-2 tablets (5-10 mg total) by mouth every 6 (six) hours as needed. 04/11/24   Alissa April, MD  pantoprazole  (PROTONIX ) 40 MG tablet Take 1 tablet (40 mg total) by mouth 2 (two) times daily before a meal. 03/27/24 05/26/24  [name], MD  polyethylene glycol (MIRALAX  / GLYCOLAX ) 17 g packet Take 17 g by mouth daily as needed for moderate constipation or severe constipation. 01/30/24   [name], [name], MD  prochlorperazine  (COMPAZINE ) 10 MG tablet Take 1 tablet (10 mg total) by mouth every 6 (six) hours as needed for nausea or vomiting. 04/11/24   Alissa April, MD  senna-docusate (SENOKOT-S) 8.6-50 MG tablet Take 1 tablet by mouth at bedtime as needed for mild constipation. 01/30/24   [name], [name], MD  sucralfate  (CARAFATE ) 1 g tablet Take 1 g by mouth in  the morning, at noon, in the evening, and at bedtime.    [provider]  ranitidine  (ZANTAC ) 150 MG tablet Take 1 tablet (150 mg total) by mouth 2 (two) times daily. Patient not taking: Reported on 10/13/2018 07/27/18 04/05/20  Emmet Harm, MD      Allergies    Promethazine hcl, Reglan  [metoclopramide ], and Rituximab -pvvr    Review of Systems   Review of Systems  Constitutional:  Negative for chills and fever.  HENT:  Negative for ear pain and sore throat.   Eyes:  Negative for pain and visual disturbance.  Respiratory:  Negative for cough and shortness of breath.    Cardiovascular:  Negative for chest pain and palpitations.  Gastrointestinal:  Positive for nausea. Negative for abdominal pain and vomiting.  Genitourinary:  Negative for dysuria and hematuria.  Musculoskeletal:  Negative for arthralgias and back pain.  Skin:  Negative for color change and rash.  Neurological:  Negative for seizures and syncope.  All other systems reviewed and are negative.   Physical Exam Updated Vital Signs BP (!) 162/89   Pulse 92   Temp 98.7 F (37.1 C) (Oral)   Resp 18   LMP 02/04/2020   SpO2 99%  Physical Exam Vitals and nursing note reviewed.  Constitutional:      General: She is not in acute distress.    Appearance: Normal appearance. She is well-developed.  HENT:     Head: Normocephalic and atraumatic.     Mouth/Throat:     Mouth: Mucous membranes are moist.  Eyes:     Extraocular Movements: Extraocular movements intact.     Conjunctiva/sclera: Conjunctivae normal.     Pupils: Pupils are equal, round, and reactive to light.  Cardiovascular:     Rate and Rhythm: Normal rate and regular rhythm.     Heart sounds: No murmur heard. Pulmonary:     Effort: Pulmonary effort is normal. No respiratory distress.     Breath sounds: Normal breath sounds.  Abdominal:     Palpations: Abdomen is soft.     Tenderness: There is no abdominal tenderness.  Musculoskeletal:        General: No swelling.     Cervical back: Normal range of motion and neck supple.  Skin:    General: Skin is warm and dry.     Capillary Refill: Capillary refill takes less than 2 seconds.  Neurological:     General: No focal deficit present.     Mental Status: She is alert and oriented to person, place, and time.  Psychiatric:        Mood and Affect: Mood normal.     ED Results / Procedures / Treatments   Labs (all labs ordered are listed, but only abnormal results are displayed) Labs Reviewed  LIPASE, BLOOD  COMPREHENSIVE METABOLIC PANEL WITH GFR  CBC  URINALYSIS, ROUTINE  W REFLEX MICROSCOPIC    EKG None  Radiology CT ABDOMEN PELVIS W CONTRAST Result Date: 04/11/2024 CLINICAL DATA:  Acute abdominal pain with nausea and vomiting, history of lymphoma EXAM: CT ABDOMEN AND PELVIS WITH CONTRAST TECHNIQUE: Multidetector CT imaging of the abdomen and pelvis was performed using the standard protocol following bolus administration of intravenous contrast. RADIATION DOSE REDUCTION: This exam was performed according to the departmental dose-optimization program which includes automated exposure control, adjustment of the mA and/or kV according to patient size and/or use of iterative reconstruction technique. CONTRAST:  80mL OMNIPAQUE  IOHEXOL  300 MG/ML  SOLN COMPARISON:  PET-CT from 02/13/2024 FINDINGS: Lower  chest: No acute abnormality. Hepatobiliary: No focal liver abnormality is seen. No gallstones, gallbladder wall thickening, or biliary dilatation. Pancreas: Unremarkable. No pancreatic ductal dilatation or surrounding inflammatory changes. Spleen: Normal in size without focal abnormality. Adrenals/Urinary Tract: Adrenal glands are within normal limits. Kidneys demonstrate a normal enhancement pattern bilaterally. No renal calculi or obstructive changes are seen. The bladder is partially distended. Stomach/Bowel: No obstructive or inflammatory changes of the colon are seen. The appendix is not well visualized although no inflammatory changes to suggest appendicitis are noted. Small bowel and stomach are within normal limits. Vascular/Lymphatic: Calcifications are noted within the abdominal aorta. Previously seen omental nodule in right gastric node are less well visualized on the current exam. No new adenopathy is seen. Reproductive: Uterus and bilateral adnexa are unremarkable. Other: No abdominal wall hernia or abnormality. No abdominopelvic ascites. Musculoskeletal: No acute or significant osseous findings. IMPRESSION: No acute abnormality noted. Electronically Signed   By: Violeta Grey M.D.   On: 04/11/2024 00:13   CT Head Wo Contrast Result Date: 04/11/2024 CLINICAL DATA:  Headaches EXAM: CT HEAD WITHOUT CONTRAST TECHNIQUE: Contiguous axial images were obtained from the base of the skull through the vertex without intravenous contrast. RADIATION DOSE REDUCTION: This exam was performed according to the departmental dose-optimization program which includes automated exposure control, adjustment of the mA and/or kV according to patient size and/or use of iterative reconstruction technique. COMPARISON:  12/08/2012 FINDINGS: Brain: No evidence of acute infarction, hemorrhage, hydrocephalus, extra-axial collection or mass lesion/mass effect. Vascular: No hyperdense vessel or unexpected calcification. Skull: Normal. Negative for fracture or focal lesion. Sinuses/Orbits: No acute finding. Other: None. IMPRESSION: No acute intracranial abnormality noted. Electronically Signed   By: Violeta Grey M.D.   On: 04/11/2024 00:09    Procedures Procedures    Medications Ordered in ED Medications - No data to display  ED Course/ Medical Decision Making/ A&P                                 Medical Decision Making Amount and/or Complexity of Data Reviewed Labs: ordered.   Patient very concerned about taking the medicines that were recommended at the time of discharge on May 8.  Patient also seen in the emergency department May 8 and the May 9.  Pharmacist has her worried about adverse reaction or overdose.  But based on the way the oxycodone  was prescribed at 5 in the way the Ativan  at a very low dose 0.5 mg everything should be safe to take together also it was recommended that she do Zofran  ODT based on her discharge and her ED discharge as well.  Patient does have the MALT lymphoma of the stomach.  Dr. Scherrie Curt did want to see her later this week in the office she will follow-up on Monday or Tuesday to make sure they have an appointment.  Is optional whether patient wants to take the  medicine or not but we would recommend the oxycodone  5 mg every 6 hours as needed for pain and the Ativan  as directed by Dr. Scherrie Curt which would be 0.5 mg twice a day.  And then would recommend supplementing that with Zofran .  Patient had extensive workup when she was seen right after discharge on May 8 to May 9 with CT head and CT abdomen pelvis.  No acute findings on those.  Do not feel any additional labs or imaging is required today.  Most of this  is concern for medication.   Final Clinical Impression(s) / ED Diagnoses Final diagnoses:  Lymphoma, unspecified body region, unspecified lymphoma type (HCC)  Nausea    Rx / DC Orders ED Discharge Orders     None         Nicklas Barns, MD 04/12/24 2115

## 2024-04-12 NOTE — Discharge Instructions (Signed)
 As we discussed I would recommend taking the oxycodone  5 mg every 6 hours as directed along with the Ativan  that Dr. Scherrie Curt wants you to take.  I would also recommend taking 4 mg of the Zofran  as well but your call on whether you want to take that or not.  On Monday give Dr. Enedina Harrow office a call for follow-up later in the week as planned from your discharge.  Return for any new or worse symptoms.  Today's vital signs look very reassuring.

## 2024-04-23 ENCOUNTER — Telehealth: Payer: Self-pay | Admitting: *Deleted

## 2024-04-23 NOTE — Telephone Encounter (Signed)
 CALLED PATIENT TO INFORM THAT HER FU VISIT ON 05-01-24 WILL BE WITH PA, ELLIE MUIR DUE TO DR.KINARD BEING OFF, SPOKE WITH PATIENT AND SHE IS GOOD WITH THIS

## 2024-04-24 ENCOUNTER — Inpatient Hospital Stay

## 2024-04-24 ENCOUNTER — Encounter: Payer: Self-pay | Admitting: Nurse Practitioner

## 2024-04-24 ENCOUNTER — Other Ambulatory Visit: Payer: Self-pay

## 2024-04-24 ENCOUNTER — Encounter: Payer: Self-pay | Admitting: Nutrition

## 2024-04-24 ENCOUNTER — Inpatient Hospital Stay: Attending: Oncology | Admitting: Nurse Practitioner

## 2024-04-24 VITALS — BP 123/74 | HR 71 | Temp 98.1°F | Resp 18 | Ht 63.0 in | Wt 118.6 lb

## 2024-04-24 DIAGNOSIS — C8599 Non-Hodgkin lymphoma, unspecified, extranodal and solid organ sites: Secondary | ICD-10-CM

## 2024-04-24 DIAGNOSIS — C884 Extranodal marginal zone b-cell lymphoma of mucosa-associated lymphoid tissue (malt-lymphoma) not having achieved remission: Secondary | ICD-10-CM | POA: Diagnosis present

## 2024-04-24 DIAGNOSIS — D72829 Elevated white blood cell count, unspecified: Secondary | ICD-10-CM | POA: Insufficient documentation

## 2024-04-24 MED ORDER — SUCRALFATE 1 G PO TABS: 1.0000 g | ORAL_TABLET | Freq: Four times a day (QID) | ORAL | 0 refills | Status: AC

## 2024-04-24 MED ORDER — PANTOPRAZOLE SODIUM 40 MG PO TBEC: 40.0000 mg | DELAYED_RELEASE_TABLET | Freq: Two times a day (BID) | ORAL | 1 refills | Status: AC

## 2024-04-24 MED ORDER — LORAZEPAM 0.5 MG PO TABS: 0.5000 mg | ORAL_TABLET | Freq: Two times a day (BID) | ORAL | 0 refills | Status: AC | PRN

## 2024-04-24 NOTE — Progress Notes (Signed)
 [facility] Clinical Social Work  Clinical Social Work was referred by nurse for assessment of psychosocial needs.  Clinical Social Worker contacted patient by phone to offer support and assess for needs.  CSW has made previous contact with patient prior to telephone call. Patient expressed primary needs as financial assistance (Primarily rent and transportation) and connection to mental health resources for self and children.   Mental Health CSW explained adjustment counseling available through [facility] with CSW. Due to patient seeking services for herself and children, external referral felt more appropriate.   Financial Assistance CSW has explored all resources known to assist with financial assistance. CSW will staff with team to find additional resources.   Maudie Sorrow, LCSW  Clinical Social Worker [facility]

## 2024-04-24 NOTE — Progress Notes (Signed)
 [facility] OFFICE PROGRESS NOTE   Diagnosis: Gastric MALT lymphoma  INTERVAL HISTORY:   Nina Horn returns for follow-up.  She notes the nausea is "a little better" but she still has fairly consistent nausea.  She is no longer vomiting.  She is tolerating a bland soft diet.  She reports good fluid intake.  Intermittent abdominal "cramps".  No diarrhea.  She is no longer taking Zofran  due to a severe headache after taking a dose over a week ago.  Prior to that she had tolerated Zofran  without difficulty and noted effective relief of nausea.  She notes that Compazine  and Ativan  help the nausea as well.  Objective:  Vital signs in last 24 hours:  Blood pressure 123/74, pulse 71, temperature 98.1 F (36.7 C), temperature source Temporal, resp. rate 18, height 5\' 3"  (1.6 m), weight 118 lb 9.6 oz (53.8 kg), last menstrual period 02/04/2020, SpO2 100%, unknown if currently breastfeeding.    HEENT: No thrush or ulcers.  Mucous membranes appear moist. Resp: Lungs clear bilaterally. Cardio: Regular rate and rhythm. GI: Abdomen is soft, mild generalized tenderness.  No hepatosplenomegaly.  No mass. Vascular: No leg edema. Neuro: Alert and oriented. Skin: Good skin turgor.   Lab Results:  Lab Results  Component Value Date   WBC 4.8 04/10/2024   HGB 13.3 04/10/2024   HCT 39.7 04/10/2024   MCV 92.1 04/10/2024   PLT 165 04/10/2024   NEUTROABS 2.4 04/08/2024    Imaging:  No results found.  Medications: I have reviewed the patient's current medications.  Assessment/Plan: MALT lymphoma of the stomach 02/05/2023-EGD-2 nonbleeding gastric ulcers in the incisor and gastric antrum, largest measured 10 mm, congestion, discoloration, nodularity, ulceration, and inflammation in the gastric antrum.  Nodularity and congestion in the gastric body and lesser curvature, gastric cardia, fundus, duodenum appeared normal. Pathology from the gastric antrum and "esophagus "revealed  benign ulcerative gastritis with an atypical lymphoid infiltrate H. pylori negative consistent with a MALT lymphoma, B lymphocytes CD20 positive, cyclin D1 negative, B-cell gene rearrangement positive 04/09/2023-EGD-no endoscopic evidence of ulceration, post ulcer scarring, nodularity in the gastric body, prepyloric region and gastric antrum-biopsy revealed an atypical lymphoid infiltrate consistent with a lymphoid neoplasm CT abdomen/pelvis 05/25/2023-mild wall thickening at the lesser curvature of the distal gastric body, mild gastropathic ligament lymphadenopathy, soft tissue nodules in the gastrocolic ligament CT abdomen/pelvis 05/31/2023-unchanged distal gastric body wall thickening, gastrohepatic lymph nodes PET 06/11/2023-low-level metabolic activity associated with the gastric antrum, a gastrohepatic ligament lymph node, and a peritoneal nodule, normal spleen Cycle 1 rituximab  06/14/2023 Cycle 2 rituximab  06/22/23 Cycle 3 rituximab  06/28/2023 Cycle 4 rituximab  07/05/2023 CT abdomen/pelvis 11/05/2023: Gastropathic ligament adenopathy decreased in size, diffuse gastric mucosal thickening with an under distended stomach-residual disease not excluded, no new abnormality 01/26/24 - 01/30/24 admission for n/v and pain; CT thickening of the distal gastric body.  EGD by Dr. Kimble Pennant showed a nonobstructing nonbleeding cratered gastric ulcer in the gastric body, path showed atypical lymphoid infiltrate consistent with recurrent MALT lymphoma.   Radiation to the stomach/perigastric lymph nodes?  03/03/2024 - 03/27/2024   2.  Nausea/vomiting and abdominal pain secondary to #1 3.  05/22/2023-left index finger ganglion cyst excision, left palmar mass excision-benign epidermal inclusion cyst 4.  Leukocytosis-likely secondary to tobacco use 5.  Tobacco use 6.  06/06/2023-readmission with intractable nausea and vomiting 7.  Refractory nausea and vomiting during/following gastric radiation April/May 2025    Disposition: Ms.  Horn appears stable.  She completed the course of gastric  radiation  4 weeks ago.  Nausea persists but is better.  She is no longer vomiting.  She is tolerating a soft diet and appears adequately hydrated.  Hopefully symptoms will continue to improve.  She will continue supportive measures including Compazine  and Ativan .  She will try Zofran  again and discontinue with recurrent headache.  She will return for follow-up in 4 weeks.  We will discuss restaging at that time.  We are available to see her sooner if needed.  Refills sent to her pharmacy for Ativan , Carafate  and Protonix .    Diana Forster ANP/GNP-BC   04/24/2024  9:05 AM

## 2024-04-25 ENCOUNTER — Other Ambulatory Visit: Payer: Self-pay

## 2024-04-25 ENCOUNTER — Inpatient Hospital Stay

## 2024-04-25 NOTE — Progress Notes (Signed)
 [facility] CSW Progress Note  CSW contacted patient by phone on this date to follow up as scheduled. CSW unable to find additional resources to help with patient current back-rent amount.CSW confirmed [facility] paid a portion of patient's rent back in May. Patient utilized the [employer] is not eligible for additional assistance.   CSW contacted LLS and applied for one time patient aid grant ($100) CSW sent patient resources provided by insurance provider via [employer].  CSW inquired about medication assistance, pending updater.   SDOH (Social Determinants of Health) assessments performed: Yes  SDOH Interventions    Flowsheet Row Clinical Support from 04/25/2024 in [facility] [place]. [facility] Office Visit from 02/07/2024 in [facility] Of [place]. [facility] ED to Hosp-Admission (Discharged) from 01/26/2024 in [facility] ED to Hosp-Admission (Discharged) from 05/31/2023 in  [facility]  SURGICAL  SDOH Interventions      Housing Interventions Community Resources Provided, Other (Comment)  Tour manager Services Referral (assisted with portion of rent) Referred patient to insurance website for assistance] -- Intervention Not Indicated, Inpatient TOC --  Transportation Interventions -- -- -- Intervention Not Indicated, Inpatient TOC, Patient Resources (Friends/Family)  Depression Interventions/Treatment  Community Resources Provided  [employer] to OPT Counseling] Patient refuses Treatment -- --  Surveyor, quantity Strain Interventions Other (Comment)  Energy manager (attempting to set up assistance for medication copays)] -- -- --       SDOH Screenings   Food Insecurity: No Food Insecurity (04/08/2024)  Housing: High Risk (04/25/2024)  Transportation Needs: No Transportation Needs (04/08/2024)  Utilities: At Risk (04/08/2024)  Depression (PHQ2-9): Low Risk  (02/18/2024)  Recent  Concern: Depression (PHQ2-9) - High Risk (02/07/2024)  Financial Resource Strain: High Risk (04/25/2024)  Tobacco Use: High Risk (04/24/2024)

## 2024-04-29 ENCOUNTER — Encounter: Payer: Self-pay | Admitting: Radiation Oncology

## 2024-04-30 ENCOUNTER — Inpatient Hospital Stay: Attending: Physician Assistant

## 2024-04-30 NOTE — Progress Notes (Signed)
 [facility] CSW Progress Note  Clinical Child psychotherapist contacted patient by phone to confirm proof of address was submitted to LLS via fax. CSW still awaiting approval / denial for medication assistance through [employer], no other resources known to CSW. Patient verbalized understanding. Patient has direct contact if needed.  Maudie Sorrow, LCSW Clinical Social Worker [facility]

## 2024-04-30 NOTE — Progress Notes (Incomplete)
 Radiation Oncology         [phone] ________________________________  Name: Nina Horn MRN: [id number]  Date: 05/01/2024  DOB: [birth year]  Follow-Up Visit Note  CC: Dianah Fort, PA  Dianah Fort, [place]  No diagnosis found.  Diagnosis:  The encounter diagnosis was MALT lymphoma.   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 She was last seen in office on 02/18/24 for her initial consultation. Since then, patient completed her radiation treatment which she tolerated quite well. Patient did however endorse experiencing significant nausea and fatigue.   In the interval since she was last seen, patient was seen in the ED on several occasions on 4/17 for nausea vomiting. Workup was unremarkable for any significant findings. She returned to the ED on 5/5 and was admitted for inpatient care at [facility] for intractable nausea, vomiting, and abdominal pain. CT a/p and CT head did not indicate any abnormalities.     Patient presented for a follow up visit with NP. Diana Forster on 5/22. At that time, she continued to complain of nausea and intermittent abdominal cramps. She also reported no longer taking Zofran  due to severe headaches.    No other significant oncologic interval history since the patient was last seen.                              Allergies:  is allergic to promethazine hcl, reglan  Domenique.Dinning ], and rituximab -pvvr.  Meds: Current Outpatient Medications  Medication Sig Dispense Refill   acetaminophen  (TYLENOL ) 500 MG tablet Take 500-1,000 mg by mouth every  6 (six) hours as needed for moderate pain (pain score 4-6). (Patient not taking: Reported on 04/24/2024)     LORazepam  (ATIVAN ) 0.5 MG tablet Take 1 tablet (0.5 mg total) by mouth 2 (two) times daily as needed (nausea/vomiting). Do not drive while taking 30 tablet 0   ondansetron  (ZOFRAN -ODT) 8 MG disintegrating tablet Take 1 tablet (8 mg total) by mouth every 8 (eight) hours as needed for nausea or vomiting. (Patient not taking: Reported on 04/24/2024) 30 tablet 1   oxyCODONE  (OXY IR/ROXICODONE ) 5 MG immediate release tablet Take 1-2 tablets (5-10 mg total) by mouth every 6 (six) hours as needed. (Patient not taking: Reported on 04/24/2024) 30 tablet 0   pantoprazole  (PROTONIX ) 40 MG tablet Take 1 tablet (40 mg total) by mouth 2 (two) times daily before a meal. 60 tablet 1   polyethylene glycol (MIRALAX  / GLYCOLAX ) 17 g packet Take 17 g by mouth daily as needed for moderate constipation or severe constipation. 14 each 0   prochlorperazine  (COMPAZINE ) 10 MG tablet Take 1 tablet (10 mg total) by mouth every 6 (six) hours as needed for nausea or vomiting. 20 tablet 0   senna-docusate (SENOKOT-S) 8.6-50 MG tablet Take 1 tablet by mouth at bedtime as needed for mild constipation. 30 tablet 0   sucralfate  (CARAFATE ) 1 g tablet Take 1 tablet (1 g total) by mouth in the morning, at noon, in the evening, and at bedtime. 120 tablet 0  No current facility-administered medications for this encounter.    Physical Findings: The patient is in no acute distress. Patient is alert and oriented.  vitals were not taken for this visit. .  No significant changes. Lungs are clear to auscultation bilaterally. Heart has regular rate and rhythm. No palpable cervical, supraclavicular, or axillary adenopathy. Abdomen soft, non-tender, normal bowel sounds.   Lab Findings: Lab Results  Component Value Date   WBC 4.8 04/10/2024   HGB 13.3 04/10/2024   HCT 39.7 04/10/2024   MCV 92.1 04/10/2024   PLT 165 04/10/2024     Radiographic Findings: CT ABDOMEN PELVIS W CONTRAST Result Date: 04/11/2024 CLINICAL DATA:  Acute abdominal pain with nausea and vomiting, history of lymphoma EXAM: CT ABDOMEN AND PELVIS WITH CONTRAST TECHNIQUE: Multidetector CT imaging of the abdomen and pelvis was performed using the standard protocol following bolus administration of intravenous contrast. RADIATION DOSE REDUCTION: This exam was performed according to the departmental dose-optimization program which includes automated exposure control, adjustment of the mA and/or kV according to patient size and/or use of iterative reconstruction technique. CONTRAST:  80mL OMNIPAQUE  IOHEXOL  300 MG/ML  SOLN COMPARISON:  PET-CT from 02/13/2024 FINDINGS: Lower chest: No acute abnormality. Hepatobiliary: No focal liver abnormality is seen. No gallstones, gallbladder wall thickening, or biliary dilatation. Pancreas: Unremarkable. No pancreatic ductal dilatation or surrounding inflammatory changes. Spleen: Normal in size without focal abnormality. Adrenals/Urinary Tract: Adrenal glands are within normal limits. Kidneys demonstrate a normal enhancement pattern bilaterally. No renal calculi or obstructive changes are seen. The bladder is partially distended. Stomach/Bowel: No obstructive or inflammatory changes of the colon are seen. The appendix is not well visualized although no inflammatory changes to suggest appendicitis are noted. Small bowel and stomach are within normal limits. Vascular/Lymphatic: Calcifications are noted within the abdominal aorta. Previously seen omental nodule in right gastric node are less well visualized on the current exam. No new adenopathy is seen. Reproductive: Uterus and bilateral adnexa are unremarkable. Other: No abdominal wall hernia or abnormality. No abdominopelvic ascites. Musculoskeletal: No acute or significant osseous findings. IMPRESSION: No acute abnormality noted. Electronically Signed   By: Violeta Grey M.D.   On:  04/11/2024 00:13   CT Head Wo Contrast Result Date: 04/11/2024 CLINICAL DATA:  Headaches EXAM: CT HEAD WITHOUT CONTRAST TECHNIQUE: Contiguous axial images were obtained from the base of the skull through the vertex without intravenous contrast. RADIATION DOSE REDUCTION: This exam was performed according to the departmental dose-optimization program which includes automated exposure control, adjustment of the mA and/or kV according to patient size and/or use of iterative reconstruction technique. COMPARISON:  12/08/2012 FINDINGS: Brain: No evidence of acute infarction, hemorrhage, hydrocephalus, extra-axial collection or mass lesion/mass effect. Vascular: No hyperdense vessel or unexpected calcification. Skull: Normal. Negative for fracture or focal lesion. Sinuses/Orbits: No acute finding. Other: None. IMPRESSION: No acute intracranial abnormality noted. Electronically Signed   By: Violeta Grey M.D.   On: 04/11/2024 00:09    Impression:  The encounter diagnosis was MALT lymphoma.  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  ***  Plan:  ***   *** minutes of total time was spent for this patient encounter, including preparation, face-to-face counseling with the patient and coordination of care, physical exam, and documentation of the encounter. ____________________________________  Noralee Beam, PhD, MD  This document serves as a record of services personally performed by [name], MD. It was created on his behalf by Lucky Sable, a  trained medical scribe. The creation of this record is based on the scribe's personal observations and the provider's statements to them. This document has been checked and approved by the attending provider.

## 2024-05-01 ENCOUNTER — Telehealth: Payer: Self-pay | Admitting: Radiation Oncology

## 2024-05-01 ENCOUNTER — Ambulatory Visit
Admission: RE | Admit: 2024-05-01 | Discharge: 2024-05-01 | Disposition: A | Discharge status: Home / Self Care | Source: Ambulatory Visit | Attending: Radiation Oncology | Admitting: Radiation Oncology

## 2024-05-01 HISTORY — DX: Personal history of irradiation: Z92.3

## 2024-05-01 NOTE — Telephone Encounter (Signed)
 Received message that pt missed appt; called pt to r/s, pt agreed to 6/5 @ 0930

## 2024-05-08 ENCOUNTER — Ambulatory Visit: Admission: RE | Admit: 2024-05-08 | Source: Ambulatory Visit | Admitting: Radiology

## 2024-05-14 NOTE — Progress Notes (Incomplete)
 Radiation Oncology         [phone] ________________________________  Name: Nina Horn MRN: [id number]  Date: 05/15/2024  DOB: 01-14-1971  Follow-Up Visit Note  CC: Dianah Fort, PA  Dianah Fort, [place]  No diagnosis found. ***  Diagnosis:  The encounter diagnosis was MALT lymphoma.   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 She was last seen in office on 02/18/24 for her initial consultation. Since then, patient completed her radiation treatment which she tolerated quite well. Patient did however endorse experiencing significant nausea and fatigue.   In the interval since she was last seen, patient was seen in the ED on several occasions on 4/17 for nausea vomiting. Workup was unremarkable for any significant findings. She returned to the ED on 5/5 and was admitted for inpatient care at [facility] long for intractable nausea, vomiting, and abdominal pain. CT a/p and CT head did not indicate any abnormalities.     Patient presented for a follow up visit with Diana Forster NP on 5/22. At that time, the patient appeared stable and was recommended to follow-up in 4 weeks. ***  No other significant oncologic interval history since the patient was last seen.                              Allergies:  is allergic to promethazine hcl, reglan  [metoclopramide ], and rituximab -pvvr.  Meds: Current Outpatient Medications  Medication Sig Dispense Refill   acetaminophen  (TYLENOL ) 500 MG tablet Take 500-1,000 mg by mouth every 6 (six) hours as needed for moderate pain (pain score 4-6).  (Patient not taking: Reported on 04/24/2024)     LORazepam  (ATIVAN ) 0.5 MG tablet Take 1 tablet (0.5 mg total) by mouth 2 (two) times daily as needed (nausea/vomiting). Do not drive while taking 30 tablet 0   ondansetron  (ZOFRAN -ODT) 8 MG disintegrating tablet Take 1 tablet (8 mg total) by mouth every 8 (eight) hours as needed for nausea or vomiting. (Patient not taking: Reported on 04/24/2024) 30 tablet 1   oxyCODONE  (OXY IR/ROXICODONE ) 5 MG immediate release tablet Take 1-2 tablets (5-10 mg total) by mouth every 6 (six) hours as needed. (Patient not taking: Reported on 04/24/2024) 30 tablet 0   pantoprazole  (PROTONIX ) 40 MG tablet Take 1 tablet (40 mg total) by mouth 2 (two) times daily before a meal. 60 tablet 1   polyethylene glycol (MIRALAX  / GLYCOLAX ) 17 g packet Take 17 g by mouth daily as needed for moderate constipation or severe constipation. 14 each 0   prochlorperazine  (COMPAZINE ) 10 MG tablet Take 1 tablet (10 mg total) by mouth every 6 (six) hours as needed for nausea or vomiting. 20 tablet 0   senna-docusate (SENOKOT-S) 8.6-50 MG tablet Take 1 tablet by mouth at bedtime as needed for mild constipation. 30 tablet 0   sucralfate  (CARAFATE ) 1 g tablet Take 1 tablet (1 g total) by mouth in the morning, at noon, in the evening, and at bedtime. 120 tablet 0   No current facility-administered medications for this encounter.  Physical Findings: The patient is in no acute distress. Patient is alert and oriented.  vitals were not taken for this visit. .  No significant changes. Lungs are clear to auscultation bilaterally. Heart has regular rate and rhythm. No palpable cervical, supraclavicular, or axillary adenopathy. Abdomen soft, non-tender, normal bowel sounds.   Lab Findings: Lab Results  Component Value Date   WBC 4.8 04/10/2024   HGB 13.3 04/10/2024   HCT 39.7 04/10/2024   MCV 92.1 04/10/2024   PLT 165 04/10/2024    Radiographic Findings: No results found.   Impression:  The  encounter diagnosis was MALT lymphoma.  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  ***  Plan: Patient is scheduled to follow-up with Dr. Scherrie Curt on 05/23/2024.  Radiation follow-up as needed.  We appreciate the opportunity to take part in this patient's care.  She was encouraged to call with any questions or concerns.   *** minutes of total time was spent for this patient encounter, including preparation, face-to-face counseling with the patient and coordination of care, physical exam, and documentation of the encounter. ____________________________________   Julio Ohm, PA-C   This document serves as a record of services personally performed by Julio Ohm, PA-C. It was created on his behalf by Lucky Sable, a trained medical scribe. The creation of this record is based on the scribe's personal observations and the provider's statements to them. This document has been checked and approved by the attending provider.

## 2024-05-15 ENCOUNTER — Ambulatory Visit
Admission: RE | Admit: 2024-05-15 | Discharge: 2024-05-15 | Disposition: A | Discharge status: Home / Self Care | Source: Ambulatory Visit | Attending: Radiation Oncology | Admitting: Radiation Oncology

## 2024-05-15 ENCOUNTER — Telehealth: Payer: Self-pay | Admitting: Radiation Oncology

## 2024-05-15 NOTE — Telephone Encounter (Signed)
 6/12 @ 9:35 am Called patient to be r/s for her missed FU appt on today.  No answer and cannot leave voicemail/mailbox not setup.  Will call again later.

## 2024-05-18 NOTE — Progress Notes (Signed)
 Radiation Oncology         [phone] ________________________________  Name: Nina Horn MRN: [id number]  Date: 05/19/2024  DOB: [birth year]  Follow-Up Visit Note  CC: Dianah Fort, PA  Dianah Fort, [place]  No diagnosis found. ***  Diagnosis:  The encounter diagnosis was MALT lymphoma.   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 She developed significant nausea and fatigue with radiation therapy but denied any other side effects from treatment.   In the interval since she started radiation therapy, she has been seen in the ED on several occasions (encounter details and dates outlined as follows):  -- In the midst of undergoing radiation therapy, she presented to the ED on 04/17 for persistent nausea and vomiting, and c/o a lump in her throat. Workup performed at that time was unremarkable, including a soft tissue neck CT which showed no acute findings (however CT imaging was very limited due to motion artifact). Encounter notes indicate that the patient attributed her symptoms to radiation therapy, and she was subsequently instructed to reach out to us  for further evaluation.  -- She returned to the ED on 05/05 and was admitted at [facility] for intractable nausea, vomiting, and abdominal pain in the setting of her malignancy. Hospital course primarily consisted of symptom management   -- She again presented to the ED on 05/08 and again on 05/10 with intractable nausea, vomiting, abdominal pain, and headaches. ED course on 05/08 again consisted of  symptom management and she was discharged home with lorazepam , Zofran , and oxycodone  5 mg (1 tablet every 6 hours) for symptoms management. A CT AP and CT of the head were also performed at that time which were both unremarkable. Per encounter notes, her return to the ED on 05/10 was attributed to her being hesitant to take the medications that she was discharged home with from her prior hospital visits.   Her symptoms were noted to be doing much better during her most recent follow-up visit with Dr. Enedina Harrow office on 04/24/24. Compazine  and ativan  alone seem to control her nausea well. She has stopped taking zofran  due to headaches.   No other significant oncologic interval history since the patient was last seen.   ***                             Allergies:  is allergic to promethazine hcl, reglan  [metoclopramide ], and rituximab -pvvr.  Meds: Current Outpatient Medications  Medication Sig Dispense Refill   acetaminophen  (TYLENOL ) 500 MG tablet Take 500-1,000 mg by mouth every 6 (six) hours as needed for moderate pain (pain score 4-6). (Patient not taking: Reported on 04/24/2024)     LORazepam  (ATIVAN ) 0.5 MG tablet Take 1 tablet (0.5 mg total) by mouth 2 (two) times daily as needed (nausea/vomiting). Do not drive while taking 30 tablet 0   ondansetron  (ZOFRAN -ODT) 8 MG disintegrating tablet Take 1 tablet (8 mg total) by mouth every 8 (eight) hours as needed for nausea or vomiting. (Patient not taking: Reported on 04/24/2024) 30 tablet 1  oxyCODONE  (OXY IR/ROXICODONE ) 5 MG immediate release tablet Take 1-2 tablets (5-10 mg total) by mouth every 6 (six) hours as needed. (Patient not taking: Reported on 04/24/2024) 30 tablet 0   pantoprazole  (PROTONIX ) 40 MG tablet Take 1 tablet (40 mg total) by mouth 2 (two) times daily before a meal. 60 tablet 1   polyethylene glycol (MIRALAX  / GLYCOLAX ) 17 g packet Take 17 g by mouth daily as needed for moderate constipation or severe constipation. 14 each 0    prochlorperazine  (COMPAZINE ) 10 MG tablet Take 1 tablet (10 mg total) by mouth every 6 (six) hours as needed for nausea or vomiting. 20 tablet 0   senna-docusate (SENOKOT-S) 8.6-50 MG tablet Take 1 tablet by mouth at bedtime as needed for mild constipation. 30 tablet 0   sucralfate  (CARAFATE ) 1 g tablet Take 1 tablet (1 g total) by mouth in the morning, at noon, in the evening, and at bedtime. 120 tablet 0   No current facility-administered medications for this encounter.    Physical Findings: The patient is in no acute distress. Patient is alert and oriented.  vitals were not taken for this visit. .  No significant changes. Lungs are clear to auscultation bilaterally. Heart has regular rate and rhythm. No palpable cervical, supraclavicular, or axillary adenopathy. Abdomen soft, non-tender, normal bowel sounds.   Lab Findings: Lab Results  Component Value Date   WBC 4.8 04/10/2024   HGB 13.3 04/10/2024   HCT 39.7 04/10/2024   MCV 92.1 04/10/2024   PLT 165 04/10/2024    Radiographic Findings: No results found.   Impression:  The encounter diagnosis was MALT lymphoma.  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  ***  Plan: Patient is scheduled to follow-up with Dr. Scherrie Curt on 05/23/2024.  Radiation follow-up as needed.  We appreciate the opportunity to take part in this patient's care.  She was encouraged to call with any questions or concerns.   *** minutes of total time was spent for this patient encounter, including preparation, face-to-face counseling with the patient and coordination of care, physical exam, and documentation of the encounter. ____________________________________   Julio Ohm, PA-C  This document serves as a record of services personally performed by Julio Ohm, PA-C. It was created on her behalf by Aleta Anda,  a trained medical scribe. The creation of this record is based on the scribe's personal observations and the provider's statements to them. This document has been checked and approved by the attending provider.

## 2024-05-19 ENCOUNTER — Ambulatory Visit
Admission: RE | Admit: 2024-05-19 | Discharge: 2024-05-19 | Disposition: A | Discharge status: Home / Self Care | Source: Ambulatory Visit | Attending: Radiation Oncology | Admitting: Radiation Oncology

## 2024-05-19 ENCOUNTER — Encounter: Payer: Self-pay | Admitting: Radiation Oncology

## 2024-05-19 VITALS — BP 113/76 | HR 74 | Temp 97.6°F | Resp 18 | Ht 63.0 in | Wt 120.6 lb

## 2024-05-19 DIAGNOSIS — Z79899 Other long term (current) drug therapy: Secondary | ICD-10-CM | POA: Insufficient documentation

## 2024-05-19 DIAGNOSIS — C884 Extranodal marginal zone b-cell lymphoma of mucosa-associated lymphoid tissue (malt-lymphoma) not having achieved remission: Secondary | ICD-10-CM | POA: Insufficient documentation

## 2024-05-19 DIAGNOSIS — Z923 Personal history of irradiation: Secondary | ICD-10-CM | POA: Insufficient documentation

## 2024-05-19 DIAGNOSIS — R09A2 Foreign body sensation, throat: Secondary | ICD-10-CM | POA: Diagnosis not present

## 2024-05-19 LAB — CBC WITH DIFFERENTIAL (CANCER CENTER ONLY)
Abs Immature Granulocytes: 0.01 10*3/uL (ref 0.00–0.07)
Basophils Absolute: 0 10*3/uL (ref 0.0–0.1)
Basophils Relative: 0 %
Eosinophils Absolute: 0.1 10*3/uL (ref 0.0–0.5)
Eosinophils Relative: 2 %
HCT: 39.8 % (ref 36.0–46.0)
Hemoglobin: 13.5 g/dL (ref 12.0–15.0)
Immature Granulocytes: 0 %
Lymphocytes Relative: 24 %
Lymphs Abs: 1 10*3/uL (ref 0.7–4.0)
MCH: 31 pg (ref 26.0–34.0)
MCHC: 33.9 g/dL (ref 30.0–36.0)
MCV: 91.3 fL (ref 80.0–100.0)
Monocytes Absolute: 0.6 10*3/uL (ref 0.1–1.0)
Monocytes Relative: 13 %
Neutro Abs: 2.5 10*3/uL (ref 1.7–7.7)
Neutrophils Relative %: 61 %
Platelet Count: 251 10*3/uL (ref 150–400)
RBC: 4.36 MIL/uL (ref 3.87–5.11)
RDW: 14.1 % (ref 11.5–15.5)
WBC Count: 4.1 10*3/uL (ref 4.0–10.5)
nRBC: 0 % (ref 0.0–0.2)

## 2024-05-19 LAB — CMP (CANCER CENTER ONLY)
ALT: 16 U/L (ref 0–44)
AST: 21 U/L (ref 15–41)
Albumin: 4.9 g/dL (ref 3.5–5.0)
Alkaline Phosphatase: 91 U/L (ref 38–126)
Anion gap: 9 (ref 5–15)
BUN: 11 mg/dL (ref 6–20)
CO2: 29 mmol/L (ref 22–32)
Calcium: 9.9 mg/dL (ref 8.9–10.3)
Chloride: 104 mmol/L (ref 98–111)
Creatinine: 0.8 mg/dL (ref 0.44–1.00)
GFR, Estimated: >60 mL/min (ref >60)
Glucose, Bld: 92 mg/dL (ref 70–99)
Potassium: 3.9 mmol/L (ref 3.5–5.1)
Sodium: 142 mmol/L (ref 135–145)
Total Bilirubin: 0.3 mg/dL (ref 0.0–1.2)
Total Protein: 7.9 g/dL (ref 6.5–8.1)

## 2024-05-19 NOTE — Progress Notes (Addendum)
 Nina Horn is here today for follow up post radiation to the abdomen  They completed their radiation on: 03/27/24.   Does the patient complain of any of the following:  Pain: No Abdominal bloating: Yes at times, depends on what she eats.  Diarrhea/Constipation: Yes , mostly constipation. Has diarrhea intermittently.  Nausea/Vomiting: No Urinary Issues (dysuria/incomplete emptying/ incontinence/ increased frequency/urgency): No Post radiation skin changes: No   Additional comments if applicable: Patient reports that she feels like she has a lump in her throat and she continues to have a scratchy voice. Encouraged patient to pick up carafate  and protonix  from pharmacy. Patient voiced understanding.    BP 113/76 (BP Location: Right Arm, Patient Position: Sitting, Cuff Size: Normal)   Pulse 74   Temp 97.6 F (36.4 C)   Resp 18   Ht 5' 3 (1.6 m)   Wt 120 lb 9.6 oz (54.7 kg)   LMP 02/04/2020   SpO2 99%   BMI 21.36 kg/m

## 2024-05-20 ENCOUNTER — Ambulatory Visit: Payer: Self-pay | Admitting: Radiology

## 2024-05-20 ENCOUNTER — Telehealth: Payer: Self-pay

## 2024-05-20 LAB — TSH: TSH: 0.557 u[IU]/mL (ref 0.350–4.500)

## 2024-05-20 NOTE — Progress Notes (Signed)
 Hi Nina Horn, can you please call this patient and let her know all labs were normal?

## 2024-05-20 NOTE — Telephone Encounter (Signed)
 Called patient to make aware of normal labs per Julio Ohm PA-C. Patient grateful.

## 2024-05-20 NOTE — Progress Notes (Signed)
 Will do! thanks

## 2024-05-23 ENCOUNTER — Encounter: Payer: Self-pay | Admitting: Nutrition

## 2024-05-23 ENCOUNTER — Inpatient Hospital Stay: Attending: Oncology | Admitting: Oncology

## 2024-05-23 VITALS — BP 115/75 | HR 67 | Temp 98.2°F | Resp 18 | Ht 63.0 in | Wt 120.1 lb

## 2024-05-23 DIAGNOSIS — C884 Extranodal marginal zone b-cell lymphoma of mucosa-associated lymphoid tissue (malt-lymphoma) not having achieved remission: Secondary | ICD-10-CM | POA: Insufficient documentation

## 2024-05-23 DIAGNOSIS — R09A2 Foreign body sensation, throat: Secondary | ICD-10-CM | POA: Diagnosis not present

## 2024-05-23 DIAGNOSIS — C8599 Non-Hodgkin lymphoma, unspecified, extranodal and solid organ sites: Secondary | ICD-10-CM | POA: Diagnosis not present

## 2024-05-23 DIAGNOSIS — Z923 Personal history of irradiation: Secondary | ICD-10-CM | POA: Diagnosis not present

## 2024-05-23 NOTE — Progress Notes (Signed)
 OFFICE PROGRESS NOTE   Diagnosis: Gastric MALT lymphoma  INTERVAL HISTORY:   Nina Horn returns as scheduled.  She has persistent nausea, but this has improved.  Her chief complaint is of malaise.  She continues to feel like there is a lump in her throat.  No fever.  She has sweats during the day and at night.  She is having bowel movements.  No abdominal pain.  She feels depressed.  She is scheduling an appointment with a counselor.  She is not working and has difficulty paying for medications and medical care.  She is currently taking no medications.  Objective:  Vital signs in last 24 hours:  Blood pressure 115/75, pulse 67, temperature 98.2 F (36.8 C), temperature source Temporal, resp. rate 18, height 5' 3 (1.6 m), weight 120 lb 1.6 oz (54.5 kg), last menstrual period 02/04/2020, SpO2 98%, unknown if currently breastfeeding.    HEENT: No thrush, pharynx without erythema or exudate, neck without mass Lymphatics: No cervical, supraclavicular, axillary, or inguinal nodes Resp: Lungs clear bilaterally Cardio: Regular rate and rhythm GI: No hepatosplenomegaly, no mass, nontender Vascular: No leg edema   Lab Results:  Lab Results  Component Value Date   WBC 4.1 05/19/2024   HGB 13.5 05/19/2024   HCT 39.8 05/19/2024   MCV 91.3 05/19/2024   PLT 251 05/19/2024   NEUTROABS 2.5 05/19/2024    CMP  Lab Results  Component Value Date   NA 142 05/19/2024   K 3.9 05/19/2024   CL 104 05/19/2024   CO2 29 05/19/2024   GLUCOSE 92 05/19/2024   BUN 11 05/19/2024   CREATININE 0.80 05/19/2024   CALCIUM 9.9 05/19/2024   PROT 7.9 05/19/2024   ALBUMIN 4.9 05/19/2024   AST 21 05/19/2024   ALT 16 05/19/2024   ALKPHOS 91 05/19/2024   BILITOT 0.3 05/19/2024   GFRNONAA >60 05/19/2024   GFRAA >60 04/05/2020    Medications: I have reviewed the patient's current medications.   Assessment/Plan: MALT lymphoma of the stomach 02/05/2023-EGD-2 nonbleeding gastric ulcers in the  incisor and gastric antrum, largest measured 10 mm, congestion, discoloration, nodularity, ulceration, and inflammation in the gastric antrum.  Nodularity and congestion in the gastric body and lesser curvature, gastric cardia, fundus, duodenum appeared normal. Pathology from the gastric antrum and esophagus revealed benign ulcerative gastritis with an atypical lymphoid infiltrate H. pylori negative consistent with a MALT lymphoma, B lymphocytes CD20 positive, cyclin D1 negative, B-cell gene rearrangement positive 04/09/2023-EGD-no endoscopic evidence of ulceration, post ulcer scarring, nodularity in the gastric body, prepyloric region and gastric antrum-biopsy revealed an atypical lymphoid infiltrate consistent with a lymphoid neoplasm CT abdomen/pelvis 05/25/2023-mild wall thickening at the lesser curvature of the distal gastric body, mild gastropathic ligament lymphadenopathy, soft tissue nodules in the gastrocolic ligament CT abdomen/pelvis 05/31/2023-unchanged distal gastric body wall thickening, gastrohepatic lymph nodes PET 06/11/2023-low-level metabolic activity associated with the gastric antrum, a gastrohepatic ligament lymph node, and a peritoneal nodule, normal spleen Cycle 1 rituximab  06/14/2023 Cycle 2 rituximab  06/22/23 Cycle 3 rituximab  06/28/2023 Cycle 4 rituximab  07/05/2023 CT abdomen/pelvis 11/05/2023: Gastropathic ligament adenopathy decreased in size, diffuse gastric mucosal thickening with an under distended stomach-residual disease not excluded, no new abnormality 01/26/24 - 01/30/24 admission for n/v and pain; CT thickening of the distal gastric body.  EGD by Dr. Kimble Pennant showed a nonobstructing nonbleeding cratered gastric ulcer in the gastric body, path showed atypical lymphoid infiltrate consistent with recurrent MALT lymphoma.   Radiation to the stomach/perigastric lymph nodes?  03/03/2024 - 03/27/2024 03/20/2024  CT neck: No lymphadenopathy, no mass 04/10/2024 CT abdomen/pelvis: Stomach  appears normal, right gastric node and omental nodule less well-visualized , no adenopathy   2.  Nausea/vomiting and abdominal pain secondary to #1 3.  05/22/2023-left index finger ganglion cyst excision, left palmar mass excision-benign epidermal inclusion cyst 4.  Leukocytosis-likely secondary to tobacco use 5.  Tobacco use 6.  06/06/2023-readmission with intractable nausea and vomiting 7.  Refractory nausea and vomiting during/following gastric radiation April/May 2025 8.  Depression     Disposition: Nina Horn has a history of gastric MALT lymphoma.  She completed a course of radiation to the stomach and perigastric lymph nodes 03/27/2024.  Treatment was complicated by nausea/vomiting and development of a lump sensation in the throat.  The nausea is significantly improved, but she continues to have the lump sensation.  A CT of the neck was negative on 03/20/2024.  I doubt her current symptoms are related to lymphoma.  She will be scheduled for a restaging CT evaluation prior to an office visit in early August.  We will contact the Cancer center social worker to be sure Nina Horn receives available social services and depression treatment.  She will call for new symptoms.  Coni Deep, MD  05/23/2024  9:15 AM

## 2024-05-23 NOTE — Progress Notes (Signed)
 Provided patient bag of groceries/toiletries from pantry as well as samples of Ensure. Sent message to CSW to see if she is eligible for any other assistance. She reports she can't afford copay on her meds and groceries and is facing eviction due to being behind on rent.

## 2024-05-24 ENCOUNTER — Other Ambulatory Visit: Payer: Self-pay

## 2024-06-04 ENCOUNTER — Encounter: Payer: Self-pay | Admitting: Oncology

## 2024-07-08 ENCOUNTER — Encounter: Payer: Self-pay | Admitting: Oncology

## 2024-07-09 ENCOUNTER — Ambulatory Visit (HOSPITAL_BASED_OUTPATIENT_CLINIC_OR_DEPARTMENT_OTHER)
Admission: RE | Admit: 2024-07-09 | Discharge: 2024-07-09 | Disposition: A | Discharge status: Home / Self Care | Source: Ambulatory Visit | Attending: Oncology | Admitting: Oncology

## 2024-07-09 ENCOUNTER — Encounter (HOSPITAL_BASED_OUTPATIENT_CLINIC_OR_DEPARTMENT_OTHER): Payer: Self-pay

## 2024-07-09 DIAGNOSIS — C8599 Non-Hodgkin lymphoma, unspecified, extranodal and solid organ sites: Secondary | ICD-10-CM | POA: Insufficient documentation

## 2024-07-09 MED ORDER — IOHEXOL 300 MG/ML  SOLN
100.0000 mL | Freq: Once | INTRAMUSCULAR | Status: AC | PRN
  Administered 2024-07-09: 80 mL via INTRAVENOUS

## 2024-07-11 ENCOUNTER — Inpatient Hospital Stay: Attending: Oncology | Admitting: Oncology

## 2024-07-11 ENCOUNTER — Telehealth: Payer: Self-pay | Admitting: *Deleted

## 2024-07-11 VITALS — BP 113/77 | HR 79 | Temp 98.1°F | Resp 16 | Wt 120.4 lb

## 2024-07-11 DIAGNOSIS — C8599 Non-Hodgkin lymphoma, unspecified, extranodal and solid organ sites: Secondary | ICD-10-CM | POA: Diagnosis not present

## 2024-07-11 DIAGNOSIS — C884 Extranodal marginal zone b-cell lymphoma of mucosa-associated lymphoid tissue (malt-lymphoma) not having achieved remission: Secondary | ICD-10-CM | POA: Diagnosis present

## 2024-07-11 MED ORDER — ESZOPICLONE 1 MG PO TABS: 1.0000 mg | ORAL_TABLET | Freq: Every evening | ORAL | 0 refills | Status: AC | PRN

## 2024-07-11 NOTE — Progress Notes (Signed)
 [facility] OFFICE PROGRESS NOTE   Diagnosis: MALT lymphoma  INTERVAL HISTORY:   Ms. Bratton returns as scheduled.  She feels well.  She reports an improved appetite.  The fullness in the throat has resolved.  No fever or night sweats.  No abdominal pain.  Depression symptoms have improved.  She has returned to work.  She requests Lunesta  for insomnia.  This has helped in the past.  She is smoking.  Objective:  Vital signs in last 24 hours:  Blood pressure 113/77, pulse 79, temperature 98.1 F (36.7 C), resp. rate 16, weight 120 lb 6.4 oz (54.6 kg), last menstrual period 02/04/2020, SpO2 100%, unknown if currently breastfeeding.    HEENT: No thrush, few 1-2 mm pale yellow areas over the tonsils and pharynx, no mass Lymphatics: No cervical, supraclavicular, axillary, or inguinal nodes Resp: End inspiratory rhonchi at the lower posterior chest bilaterally but no respiratory distress Cardio: Regular rate and rhythm GI: Mast, nontender, no hepatosplenomegaly Vascular: No leg edema  Lab Results:  Lab Results  Component Value Date   WBC 4.1 05/19/2024   HGB 13.5 05/19/2024   HCT 39.8 05/19/2024   MCV 91.3 05/19/2024   PLT 251 05/19/2024   NEUTROABS 2.5 05/19/2024    CMP  Lab Results  Component Value Date   NA 142 05/19/2024   K 3.9 05/19/2024   CL 104 05/19/2024   CO2 29 05/19/2024   GLUCOSE 92 05/19/2024   BUN 11 05/19/2024   CREATININE 0.80 05/19/2024   CALCIUM 9.9 05/19/2024   PROT 7.9 05/19/2024   ALBUMIN 4.9 05/19/2024   AST 21 05/19/2024   ALT 16 05/19/2024   ALKPHOS 91 05/19/2024   BILITOT 0.3 05/19/2024   GFRNONAA >60 05/19/2024   GFRAA >60 04/05/2020    No results found for: CEA1, CEA, CAN199, CA125  No results found for: INR, LABPROT  Imaging:  CT CHEST ABDOMEN PELVIS W CONTRAST Result Date: 07/10/2024 CLINICAL DATA:  Gastric MALT.  Restaging. EXAM: CT CHEST, ABDOMEN, AND PELVIS WITH CONTRAST TECHNIQUE: Multidetector CT  imaging of the chest, abdomen and pelvis was performed following the standard protocol during bolus administration of intravenous contrast. RADIATION DOSE REDUCTION: This exam was performed according to the departmental dose-optimization program which includes automated exposure control, adjustment of the mA and/or kV according to patient size and/or use of iterative reconstruction technique. CONTRAST:  80mL OMNIPAQUE  IOHEXOL  300 MG/ML  SOLN COMPARISON:  CT abdomen pelvis dated 04/11/2024. FINDINGS: CT CHEST FINDINGS Cardiovascular: There is no cardiomegaly or pericardial effusion. The thoracic aorta is unremarkable the origins of the great vessels of the aortic arch and the central pulmonary arteries are patent. Mediastinum/Nodes: No hilar or mediastinal adenopathy. The esophagus is grossly unremarkable no mediastinal fluid collection. Lungs/Pleura: A 3 mm left upper lobe subpleural nodule (43/3) appears new since the PET-CT of 02/13/2024. No focal consolidation, pleural effusion, pneumothorax. The central airways are patent. Musculoskeletal: No acute osseous pathology. CT ABDOMEN PELVIS FINDINGS No intra-abdominal free air or free fluid. Hepatobiliary: The liver is unremarkable. No biliary ductal dilatation. The gallbladder is unremarkable Pancreas: Unremarkable. No pancreatic ductal dilatation or surrounding inflammatory changes. Spleen: Normal in size without focal abnormality. Adrenals/Urinary Tract: The adrenal glands unremarkable. There is no hydronephrosis on either side. There is symmetric enhancement and excretion of contrast by both kidneys. The visualized ureters and urinary bladder appear unremarkable. Stomach/Bowel: There is no bowel obstruction or active inflammation. The appendix is not visualized with certainty. No inflammatory changes identified in the right  lower quadrant. Vascular/Lymphatic: Mild aortoiliac atherosclerotic disease. The IVC is unremarkable. No portal venous gas. There is no  adenopathy. Reproductive: The uterus is anteverted. No suspicious adnexal masses. Other: The omental nodule seen on the PET-CT of 02/13/2024 is not identified on today's CT. Musculoskeletal: No acute or significant osseous findings. IMPRESSION: 1. No acute intrathoracic, abdominal, or pelvic pathology. 2. A 3 mm left upper lobe subpleural nodule, new since the PET-CT of 02/13/2024. 3. No evidence of metastatic disease in the abdomen or pelvis. 4.  Aortic Atherosclerosis (ICD10-I70.0). Electronically Signed   By: Vanetta Chou M.D.   On: 07/10/2024 19:42    Medications: I have reviewed the patient's current medications.   Assessment/Plan: MALT lymphoma of the stomach 02/05/2023-EGD-2 nonbleeding gastric ulcers in the incisor and gastric antrum, largest measured 10 mm, congestion, discoloration, nodularity, ulceration, and inflammation in the gastric antrum.  Nodularity and congestion in the gastric body and lesser curvature, gastric cardia, fundus, duodenum appeared normal. Pathology from the gastric antrum and esophagus revealed benign ulcerative gastritis with an atypical lymphoid infiltrate H. pylori negative consistent with a MALT lymphoma, B lymphocytes CD20 positive, cyclin D1 negative, B-cell gene rearrangement positive 04/09/2023-EGD-no endoscopic evidence of ulceration, post ulcer scarring, nodularity in the gastric body, prepyloric region and gastric antrum-biopsy revealed an atypical lymphoid infiltrate consistent with a lymphoid neoplasm CT abdomen/pelvis 05/25/2023-mild wall thickening at the lesser curvature of the distal gastric body, mild gastropathic ligament lymphadenopathy, soft tissue nodules in the gastrocolic ligament CT abdomen/pelvis 05/31/2023-unchanged distal gastric body wall thickening, gastrohepatic lymph nodes PET 06/11/2023-low-level metabolic activity associated with the gastric antrum, a gastrohepatic ligament lymph node, and a peritoneal nodule, normal spleen Cycle 1  rituximab  06/14/2023 Cycle 2 rituximab  06/22/23 Cycle 3 rituximab  06/28/2023 Cycle 4 rituximab  07/05/2023 CT abdomen/pelvis 11/05/2023: Gastropathic ligament adenopathy decreased in size, diffuse gastric mucosal thickening with an under distended stomach-residual disease not excluded, no new abnormality 01/26/24 - 01/30/24 admission for n/v and pain; CT thickening of the distal gastric body.  EGD by Dr. Burnette showed a nonobstructing nonbleeding cratered gastric ulcer in the gastric body, path showed atypical lymphoid infiltrate consistent with recurrent MALT lymphoma.   Radiation to the stomach/perigastric lymph nodes?  03/03/2024 - 03/27/2024 03/20/2024 CT neck: No lymphadenopathy, no mass 04/10/2024 CT abdomen/pelvis: Stomach appears normal, right gastric node and omental nodule less well-visualized , no adenopathy 07/09/2024 CTs-new 3 mm left upper lobe subpleural nodule, no adenopathy, omental nodule on the 02/13/2024 PET is not seen   2.  Nausea/vomiting and abdominal pain secondary to #1 3.  05/22/2023-left index finger ganglion cyst excision, left palmar mass excision-benign epidermal inclusion cyst 4.  Leukocytosis-likely secondary to tobacco use 5.  Tobacco use 6.  06/06/2023-readmission with intractable nausea and vomiting 7.  Refractory nausea and vomiting during/following gastric radiation April/May 2025 8.  Depression      Disposition: Nina Horn appears well.  She has experienced a significant improvement in her overall performance status over the past few months.  The restaging CT reveals no evidence of residual lymphoma.  The left upper lobe subpleural nodule is likely benign finding.  We will plan a follow-up chest CT in approximately 6 months.  She will return for an office visit in 4 months.  She will begin a trial of Lunesta  for insomnia.  She is currently taking no medications.  Arley Hof, MD  07/11/2024  8:57 AM

## 2024-07-11 NOTE — Telephone Encounter (Signed)
 Notified Nina Horn that after further review, MD noted she has a small 3 mm left lung subpleural nodule. It was not noted in last scan. Dr. Cloretta feels it is most likely benign, but to er on the side of caution, would like a non-contrast CT of chest in 6 months. She agrees to plan.

## 2024-07-12 ENCOUNTER — Other Ambulatory Visit: Payer: Self-pay

## 2024-09-12 ENCOUNTER — Emergency Department (HOSPITAL_COMMUNITY)

## 2024-09-12 ENCOUNTER — Emergency Department (HOSPITAL_COMMUNITY)
Admission: EM | Admit: 2024-09-12 | Discharge: 2024-09-12 | Disposition: A | Discharge status: Home / Self Care | Attending: Emergency Medicine | Admitting: Emergency Medicine

## 2024-09-12 DIAGNOSIS — M79671 Pain in right foot: Secondary | ICD-10-CM | POA: Insufficient documentation

## 2024-09-12 MED ORDER — CELECOXIB 200 MG PO CAPS: 200.0000 mg | ORAL_CAPSULE | Freq: Two times a day (BID) | ORAL | 0 refills | Status: AC

## 2024-09-12 NOTE — ED Provider Notes (Signed)
 [facility] AT [facility] Provider Note   CSN: [id number] Arrival date & time: 09/12/24  1042     Patient presents with: Foot Pain   Nina Horn is a 53 y.o. female who presents for right heel pain has been getting progressively worse over the last week.  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  At rest, and when nonweightbearing, pain is completely resolved.  Has intermittently used ibuprofen  for pain relief, however states that this has not significantly improved her pain.  Has an appointment with primary care on Tuesday of next week, however due to significant pain presented to the ED today.    Foot Pain       Prior to Admission medications   Medication Sig Start Date End Date Taking? Authorizing Provider  celecoxib (CELEBREX) 200 MG capsule Take 1 capsule (200 mg total) by mouth 2 (two) times daily. 09/12/24  Yes Myriam Dorn BROCKS, PA  acetaminophen  (TYLENOL ) 500 MG tablet Take 500-1,000 mg by mouth every 6 (six) hours as needed for moderate pain (pain score 4-6). Patient not taking: Reported on 07/11/2024    [provider]  eszopiclone  (LUNESTA ) 1 MG TABS tablet Take 1 tablet (1 mg total) by mouth at bedtime as needed for sleep. Take immediately before bedtime 07/11/24   Cloretta Arley NOVAK, MD  LORazepam  (ATIVAN ) 0.5 MG tablet Take 1 tablet (0.5 mg total) by mouth 2 (two) times daily as needed (nausea/vomiting). Do not drive while taking Patient not taking: Reported on 07/11/2024 04/24/24   Debby Olam POUR, NP  ondansetron  (ZOFRAN -ODT) 8 MG disintegrating tablet Take 1 tablet (8 mg total) by mouth every 8 (eight) hours as needed for nausea or vomiting. Patient not taking: Reported on 07/11/2024 04/11/24   Raford Lenis, MD  oxyCODONE  (OXY IR/ROXICODONE ) 5 MG immediate release tablet Take 1-2 tablets (5-10  mg total) by mouth every 6 (six) hours as needed. Patient not taking: Reported on 07/11/2024 04/11/24   Raford Lenis, MD  pantoprazole  (PROTONIX ) 40 MG tablet Take 1 tablet (40 mg total) by mouth 2 (two) times daily before a meal. Patient not taking: Reported on 07/11/2024 04/24/24 06/23/24  Debby Olam POUR, NP  polyethylene glycol (MIRALAX  / GLYCOLAX ) 17 g packet Take 17 g by mouth daily as needed for moderate constipation or severe constipation. Patient not taking: Reported on 07/11/2024 01/30/24   Caleen Burgess BROCKS, MD  prochlorperazine  (COMPAZINE ) 10 MG tablet Take 1 tablet (10 mg total) by mouth every 6 (six) hours as needed for nausea or vomiting. Patient not taking: Reported on 07/11/2024 04/11/24   Raford Lenis, MD  senna-docusate (SENOKOT-S) 8.6-50 MG tablet Take 1 tablet by mouth at bedtime as needed for mild constipation. Patient not taking: Reported on 07/11/2024 01/30/24   Caleen Burgess BROCKS, MD  sucralfate  (CARAFATE ) 1 g tablet Take 1 tablet (1 g total) by mouth in the morning, at noon, in the evening, and at bedtime. Patient not taking: Reported on 07/11/2024 04/24/24   Debby Olam POUR, NP  ranitidine  (ZANTAC ) 150 MG tablet Take 1 tablet (150 mg total) by mouth 2 (two) times daily. Patient not taking: Reported on 10/13/2018 07/27/18 04/05/20  Kelsey Bills, MD    Allergies: Scopolamine , Promethazine hcl, Reglan  [metoclopramide ], and Rituximab -pvvr    Review of Systems  Musculoskeletal:  Positive for arthralgias.  All other systems  reviewed and are negative.   Updated Vital Signs BP 100/66 (BP Location: Left Arm)   Pulse 83   Temp 98.7 F (37.1 C) (Oral)   Resp 18   LMP 02/04/2020   SpO2 100%   Physical Exam Vitals and nursing note reviewed.  Constitutional:      General: She is not in acute distress.    Appearance: She is well-developed.  HENT:     Head: Normocephalic and atraumatic.  Eyes:     Conjunctiva/sclera: Conjunctivae normal.  Cardiovascular:     Rate and Rhythm: Normal rate and  regular rhythm.     Heart sounds: No murmur heard. Pulmonary:     Effort: Pulmonary effort is normal. No respiratory distress.     Breath sounds: Normal breath sounds.  Abdominal:     Palpations: Abdomen is soft.     Tenderness: There is no abdominal tenderness.  Musculoskeletal:        General: No swelling.     Cervical back: Neck supple.     Right foot: Tenderness present.     Left foot: Normal.     Comments: Tenderness at the plantar surface of the heel of the right foot.  No overt fractures or deformities are appreciated.  No distal numbness or paresthesia, intact motion and sensation distally.  Skin:    General: Skin is warm and dry.     Capillary Refill: Capillary refill takes less than 2 seconds.  Neurological:     Mental Status: She is alert.  Psychiatric:        Mood and Affect: Mood normal.     (all labs ordered are listed, but only abnormal results are displayed) Labs Reviewed  PREGNANCY, URINE    EKG: None  Radiology: DG Foot Complete Right Result Date: 09/12/2024 EXAM: 3 or more VIEW(S) XRAY OF THE RIGHT FOOT 09/12/2024 11:33:25 AM COMPARISON: None available. CLINICAL HISTORY: Right heel pain. Pt states heel pain. FINDINGS: BONES AND JOINTS: No acute fracture. No focal osseous lesion. No joint dislocation. SOFT TISSUES: The soft tissues are unremarkable. IMPRESSION: 1. No significant abnormality. Electronically signed by: Lynwood Seip MD 09/12/2024 12:01 PM EDT RP Workstation: [id number]   DG Ankle Complete Right Result Date: 09/12/2024 EXAM: 3 or more VIEW(S) XRAY OF THE RIGHT ANKLE 09/12/2024 11:33:25 AM CLINICAL HISTORY: Right heel pain. Pt states heel pain. COMPARISON: None available. FINDINGS: BONES AND JOINTS: No acute fracture. No focal osseous lesion. No joint dislocation. SOFT TISSUES: The soft tissues are unremarkable. IMPRESSION: 1. No acute osseous abnormality. Electronically signed by: Lynwood Seip MD 09/12/2024 12:00 PM EDT RP Workstation: [id number]      Procedures   Medications Ordered in the ED - No data to display                                  Medical Decision Making Amount and/or Complexity of Data Reviewed Labs: ordered. Radiology: ordered.   Medical Decision Making:   Nina Horn is a 53 y.o. female who presented to the ED today with right heel pain detailed above.     Complete initial physical exam performed, notably the patient  was alert and oriented in no apparent distress..    Reviewed and confirmed nursing documentation for past medical history, family history, social history.    Initial Assessment:   With the patient's presentation of right calcaneal pain, consider possible fracture however in light of no recent injury this  is less likely, further consider plantar fasciitis, osteophyte development, osteoarthritis.   Initial Plan:  Obtain plain film imaging of the right foot and ankle to evaluate the bony structures of the same. Objective evaluation as below reviewed   Initial Study Results:    Radiology:  All images reviewed independently. Agree with radiology report at this time.   DG Foot Complete Right Result Date: 09/12/2024 EXAM: 3 or more VIEW(S) XRAY OF THE RIGHT FOOT 09/12/2024 11:33:25 AM COMPARISON: None available. CLINICAL HISTORY: Right heel pain. Pt states heel pain. FINDINGS: BONES AND JOINTS: No acute fracture. No focal osseous lesion. No joint dislocation. SOFT TISSUES: The soft tissues are unremarkable. IMPRESSION: 1. No significant abnormality. Electronically signed by: Lynwood Seip MD 09/12/2024 12:01 PM EDT RP Workstation: [id number]   DG Ankle Complete Right Result Date: 09/12/2024 EXAM: 3 or more VIEW(S) XRAY OF THE RIGHT ANKLE 09/12/2024 11:33:25 AM CLINICAL HISTORY: Right heel pain. Pt states heel pain. COMPARISON: None available. FINDINGS: BONES AND JOINTS: No acute fracture. No focal osseous lesion. No joint dislocation. SOFT TISSUES: The soft tissues are unremarkable.  IMPRESSION: 1. No acute osseous abnormality. Electronically signed by: Lynwood Seip MD 09/12/2024 12:00 PM EDT RP Workstation: [id number]    Reassessment and Plan:   Plain film imaging does not show any acute bony abnormality, but there is certainly exquisite tenderness to the plantar surface of the right heel.  Given his findings, we will provide with heel pads to help offload the heel, managed with celecoxib for anti-inflammatory effect, and have patient follow-up with podiatry for further assessment and management.  This was discussed with the patient, they understand agreeable for most concerns at this time.  He has no concerning findings on evaluation and vital signs been stable within normal limits, she is stable for discharge and outpatient management.       Final diagnoses:  Inflammatory heel pain, right    ED Discharge Orders          Ordered    celecoxib (CELEBREX) 200 MG capsule  2 times daily        09/12/24 1219               Myriam Dorn BROCKS, [place] 09/12/24 1221    Freddi Hamilton, MD 09/15/24 1640

## 2024-09-12 NOTE — Progress Notes (Signed)
 Orthopedic Tech Progress Note Patient Details:  Nina Horn 1971-03-15 [id number]  Ortho Devices Type of Ortho Device: CAM walker Ortho Device/Splint Location: RLE Ortho Device/Splint Interventions: Application   Post Interventions Patient Tolerated: Well  Lucia Mccreadie E Malloree Raboin 09/12/2024, 1:19 PM

## 2024-09-12 NOTE — ED Triage Notes (Addendum)
 Noticed sensitivity in right heel for the last week. Starting yesterday patient was unable to walk properly due to pain in the heel. No known injury. Hx of stage 4 stomach cancer. One more CT scan in January prior to being declared in remission.

## 2024-09-22 ENCOUNTER — Telehealth: Payer: Self-pay

## 2024-09-22 NOTE — Telephone Encounter (Signed)
 [facility] CSW Progress Note  Clinical Social Worker contacted patient on this date to discuss holiday assistance program. CSW sent information via email (previous [place] given).    Lizbeth Sprague, LCSW Clinical Social Worker [facility]

## 2024-10-30 ENCOUNTER — Other Ambulatory Visit: Payer: Self-pay

## 2024-11-10 ENCOUNTER — Ambulatory Visit: Admitting: Oncology

## 2024-11-10 ENCOUNTER — Inpatient Hospital Stay: Attending: Nurse Practitioner | Admitting: Nurse Practitioner

## 2025-01-02 ENCOUNTER — Telehealth: Payer: Self-pay | Admitting: *Deleted

## 2025-01-02 NOTE — Telephone Encounter (Signed)
 Attempted to reach patient to talk about scheduling her CT scan due in February. Unable to reach her. Sent mychart message as well.

## 2025-01-04 ENCOUNTER — Other Ambulatory Visit: Payer: Self-pay

## 2025-01-12 ENCOUNTER — Ambulatory Visit (HOSPITAL_BASED_OUTPATIENT_CLINIC_OR_DEPARTMENT_OTHER)
